# Patient Record
Sex: Female | Born: 1960 | Race: White | Hispanic: No | Marital: Married | Smoking: Never smoker
Health system: Southern US, Community
[De-identification: ages and names within clinical notes are randomized; demographics above are authoritative.]

## PROBLEM LIST (undated history)

## (undated) DIAGNOSIS — R112 Nausea with vomiting, unspecified: Secondary | ICD-10-CM

## (undated) DIAGNOSIS — L304 Erythema intertrigo: Secondary | ICD-10-CM

## (undated) DIAGNOSIS — R9431 Abnormal electrocardiogram [ECG] [EKG]: Secondary | ICD-10-CM

## (undated) DIAGNOSIS — R7989 Other specified abnormal findings of blood chemistry: Secondary | ICD-10-CM

## (undated) DIAGNOSIS — I10 Essential (primary) hypertension: Secondary | ICD-10-CM

## (undated) DIAGNOSIS — Z9889 Other specified postprocedural states: Secondary | ICD-10-CM

## (undated) HISTORY — DX: Erythema intertrigo: L30.4

## (undated) HISTORY — DX: Abnormal electrocardiogram (ECG) (EKG): R94.31

## (undated) HISTORY — DX: Other specified abnormal findings of blood chemistry: R79.89

## (undated) HISTORY — DX: Nausea with vomiting, unspecified: R11.2

## (undated) HISTORY — PX: KNEE SURGERY: SHX244

## (undated) HISTORY — PX: DILATION AND CURETTAGE OF UTERUS: SHX78

## (undated) HISTORY — DX: Other specified postprocedural states: Z98.890

## (undated) HISTORY — DX: Essential (primary) hypertension: I10

---

## 1998-03-02 ENCOUNTER — Other Ambulatory Visit: Admission: RE | Admit: 1998-03-02 | Discharge: 1998-03-02 | Payer: Self-pay | Admitting: Otolaryngology

## 1999-05-11 ENCOUNTER — Emergency Department (HOSPITAL_COMMUNITY): Admission: EM | Admit: 1999-05-11 | Discharge: 1999-05-11 | Payer: Self-pay | Admitting: Emergency Medicine

## 1999-05-11 ENCOUNTER — Encounter: Payer: Self-pay | Admitting: Emergency Medicine

## 2001-06-29 ENCOUNTER — Other Ambulatory Visit: Admission: RE | Admit: 2001-06-29 | Discharge: 2001-06-29 | Payer: Self-pay | Admitting: Gynecology

## 2002-07-29 ENCOUNTER — Other Ambulatory Visit: Admission: RE | Admit: 2002-07-29 | Discharge: 2002-07-29 | Payer: Self-pay | Admitting: Gynecology

## 2002-08-09 ENCOUNTER — Encounter: Payer: Self-pay | Admitting: Gynecology

## 2002-08-09 ENCOUNTER — Encounter: Admission: RE | Admit: 2002-08-09 | Discharge: 2002-08-09 | Payer: Self-pay | Admitting: Gynecology

## 2002-08-13 ENCOUNTER — Encounter: Payer: Self-pay | Admitting: Gynecology

## 2002-08-13 ENCOUNTER — Encounter: Admission: RE | Admit: 2002-08-13 | Discharge: 2002-08-13 | Payer: Self-pay | Admitting: Gynecology

## 2003-11-14 ENCOUNTER — Other Ambulatory Visit: Admission: RE | Admit: 2003-11-14 | Discharge: 2003-11-14 | Payer: Self-pay | Admitting: Gynecology

## 2005-02-16 ENCOUNTER — Other Ambulatory Visit: Admission: RE | Admit: 2005-02-16 | Discharge: 2005-02-16 | Payer: Self-pay | Admitting: Gynecology

## 2006-04-28 ENCOUNTER — Other Ambulatory Visit: Admission: RE | Admit: 2006-04-28 | Discharge: 2006-04-28 | Payer: Self-pay | Admitting: Gynecology

## 2006-06-09 ENCOUNTER — Encounter: Admission: RE | Admit: 2006-06-09 | Discharge: 2006-06-09 | Payer: Self-pay | Admitting: Gynecology

## 2007-06-01 ENCOUNTER — Other Ambulatory Visit: Admission: RE | Admit: 2007-06-01 | Discharge: 2007-06-01 | Payer: Self-pay | Admitting: Gynecology

## 2008-08-08 ENCOUNTER — Encounter: Admission: RE | Admit: 2008-08-08 | Discharge: 2008-08-08 | Payer: Self-pay | Admitting: Gynecology

## 2008-08-15 ENCOUNTER — Ambulatory Visit: Payer: Self-pay | Admitting: Gynecology

## 2008-08-15 ENCOUNTER — Encounter: Payer: Self-pay | Admitting: Gynecology

## 2008-08-15 ENCOUNTER — Other Ambulatory Visit: Admission: RE | Admit: 2008-08-15 | Discharge: 2008-08-15 | Payer: Self-pay | Admitting: Gynecology

## 2008-08-26 ENCOUNTER — Ambulatory Visit: Payer: Self-pay | Admitting: Gynecology

## 2008-09-10 ENCOUNTER — Ambulatory Visit: Payer: Self-pay | Admitting: Internal Medicine

## 2008-09-10 LAB — COMPREHENSIVE METABOLIC PANEL
Albumin: 4 g/dL (ref 3.5–5.2)
BUN: 10 mg/dL (ref 6–23)
CO2: 24 mEq/L (ref 19–32)
Calcium: 9.7 mg/dL (ref 8.4–10.5)
Chloride: 104 mEq/L (ref 96–112)
Glucose, Bld: 82 mg/dL (ref 70–99)
Potassium: 4.1 mEq/L (ref 3.5–5.3)
Sodium: 139 mEq/L (ref 135–145)
Total Protein: 7.8 g/dL (ref 6.0–8.3)

## 2008-09-10 LAB — CBC WITH DIFFERENTIAL/PLATELET
Basophils Absolute: 0.1 10*3/uL (ref 0.0–0.1)
Eosinophils Absolute: 0 10*3/uL (ref 0.0–0.5)
HCT: 41.5 % (ref 34.8–46.6)
HGB: 13.9 g/dL (ref 11.6–15.9)
NEUT#: 5.5 10*3/uL (ref 1.5–6.5)
NEUT%: 56.4 % (ref 39.6–76.8)
RDW: 13.5 % (ref 11.3–14.5)
lymph#: 3.3 10*3/uL (ref 0.9–3.3)

## 2008-09-10 LAB — IRON AND TIBC
%SAT: 24 % (ref 20–55)
TIBC: 289 ug/dL (ref 250–470)

## 2008-09-18 ENCOUNTER — Ambulatory Visit (HOSPITAL_COMMUNITY): Admission: RE | Admit: 2008-09-18 | Discharge: 2008-09-18 | Payer: Self-pay | Admitting: Internal Medicine

## 2008-09-29 LAB — CBC WITH DIFFERENTIAL/PLATELET
BASO%: 0.6 % (ref 0.0–2.0)
EOS%: 0.6 % (ref 0.0–7.0)
HGB: 13.6 g/dL (ref 11.6–15.9)
LYMPH%: 36.5 % (ref 14.0–48.0)
MONO%: 9.1 % (ref 0.0–13.0)
NEUT#: 4.8 10*3/uL (ref 1.5–6.5)
NEUT%: 53.2 % (ref 39.6–76.8)
WBC: 9 10*3/uL (ref 3.9–10.0)
lymph#: 3.3 10*3/uL (ref 0.9–3.3)

## 2008-12-16 ENCOUNTER — Ambulatory Visit: Payer: Self-pay | Admitting: Internal Medicine

## 2008-12-22 LAB — CBC WITH DIFFERENTIAL/PLATELET
BASO%: 0.7 % (ref 0.0–2.0)
EOS%: 0.9 % (ref 0.0–7.0)
HCT: 41 % (ref 34.8–46.6)
HGB: 13.8 g/dL (ref 11.6–15.9)
MCH: 30.5 pg (ref 26.0–34.0)
MCHC: 33.6 g/dL (ref 32.0–36.0)
MONO#: 0.8 10*3/uL (ref 0.1–0.9)
NEUT%: 53.1 % (ref 39.6–76.8)
RDW: 13.8 % (ref 11.3–14.5)
WBC: 9.6 10*3/uL (ref 3.9–10.0)
lymph#: 3.6 10*3/uL — ABNORMAL HIGH (ref 0.9–3.3)

## 2008-12-25 LAB — SEDIMENTATION RATE: Sed Rate: 15 mm/hr (ref 0–22)

## 2008-12-25 LAB — JAK2 EXONS 12 & 13 MUTATION, REFLEX: JAK2 Exons 12 & 13: 13

## 2009-02-06 IMAGING — US US ABDOMEN COMPLETE
1 series · 14 of 25 positions shown · non-contrast
Comparison: None

CLINICAL DATA: Thrombocytopenia possible splenomegaly

ABDOMEN ULTRASOUND
TECHNIQUE: Complete abdominal ultrasound examination was performed
including evaluation of the liver, gallbladder, bile ducts,
pancreas, kidneys, spleen, IVC, and abdominal aorta.

[Series 1: unknown · 0.32mm/px · 14 of 108 slices shown]
[im 1/108]
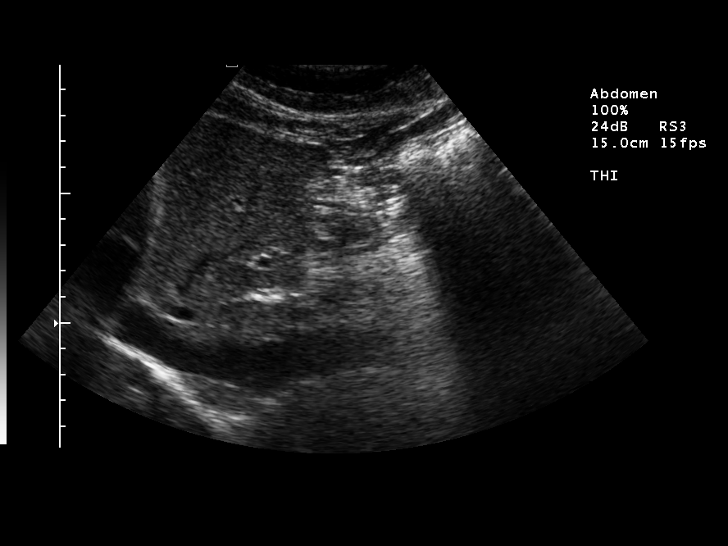
[im 9/108]
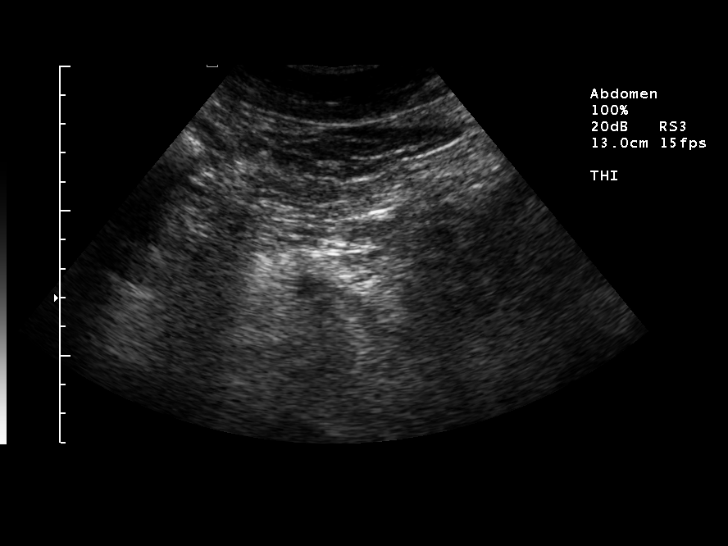
[im 18/108]
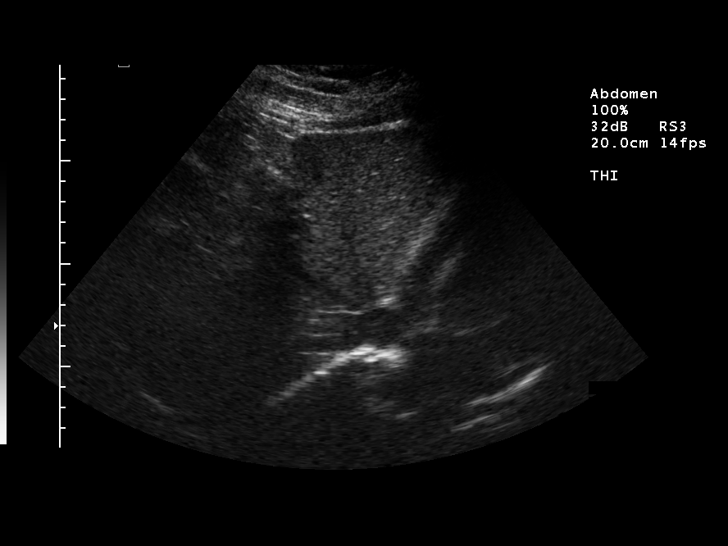
[im 27/108]
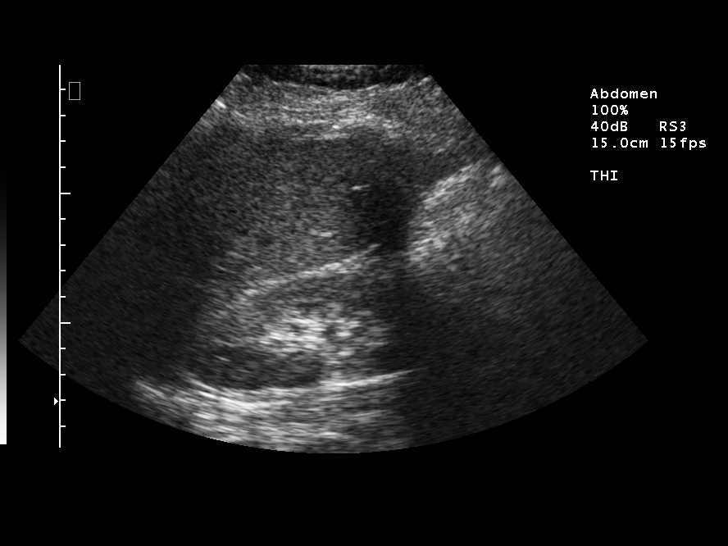
[im 36/108]
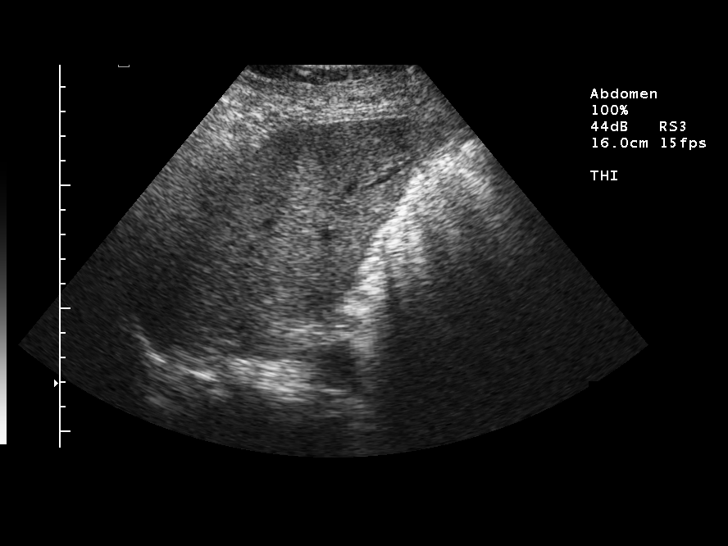
[im 41/108]
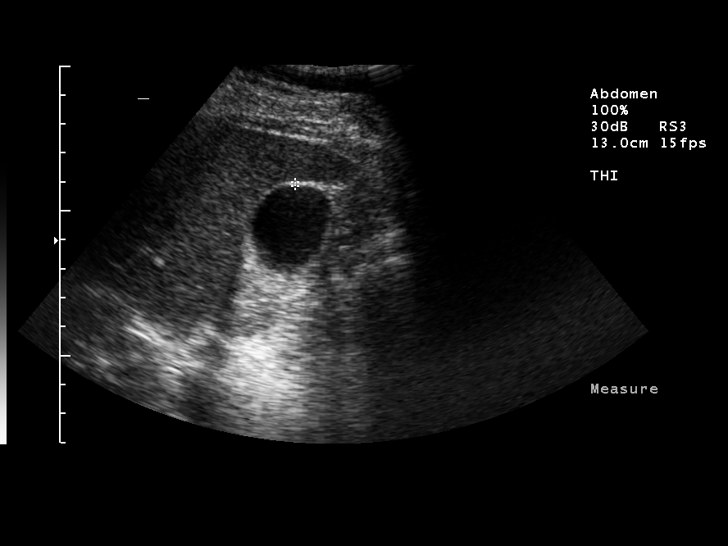
[im 50/108]
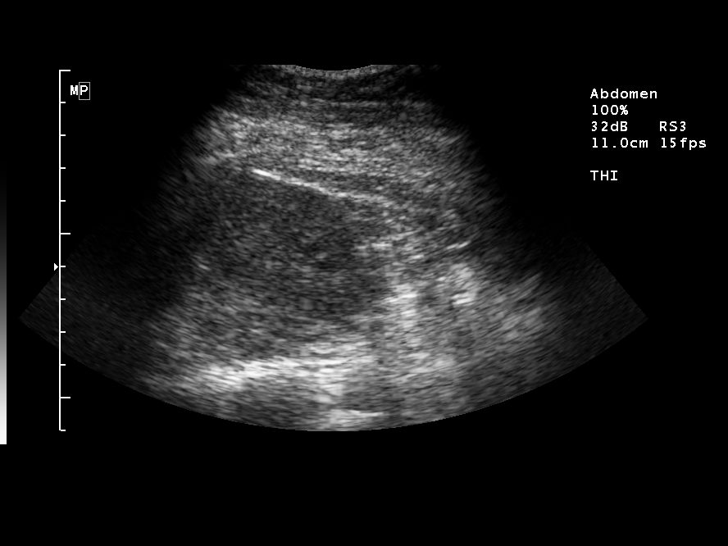
[im 58/108]
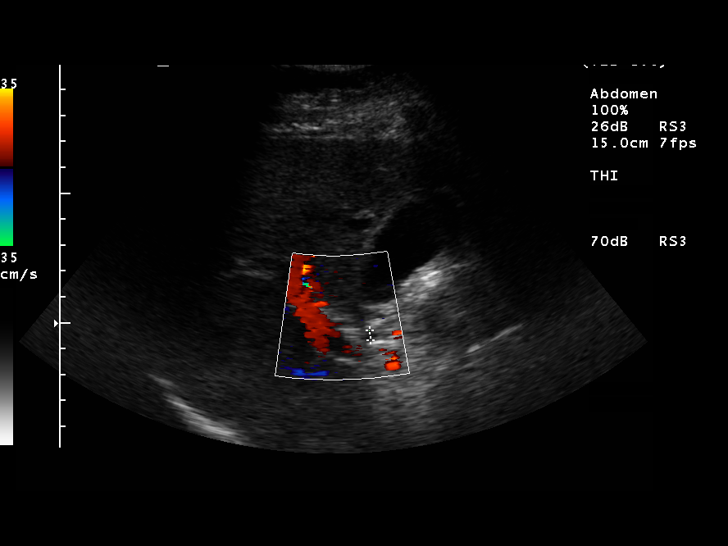
[im 67/108]
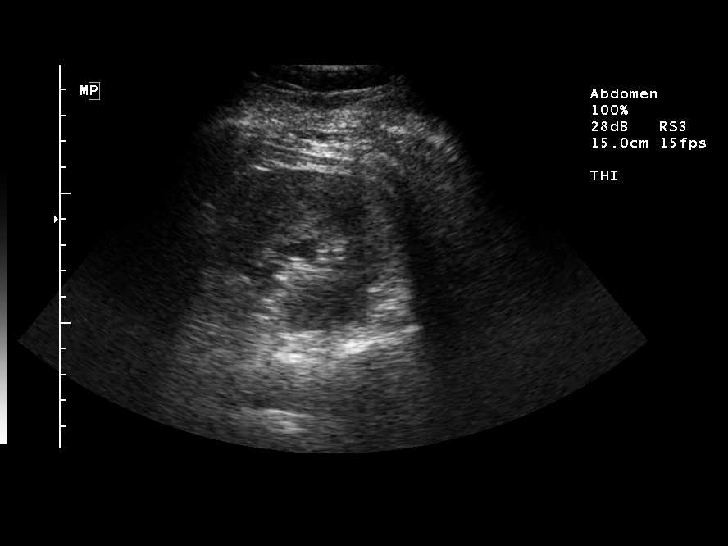
[im 72/108]
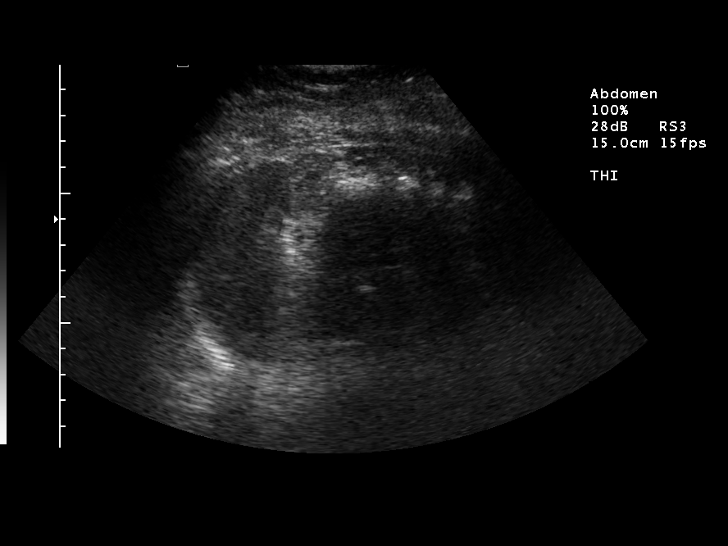
[im 81/108]
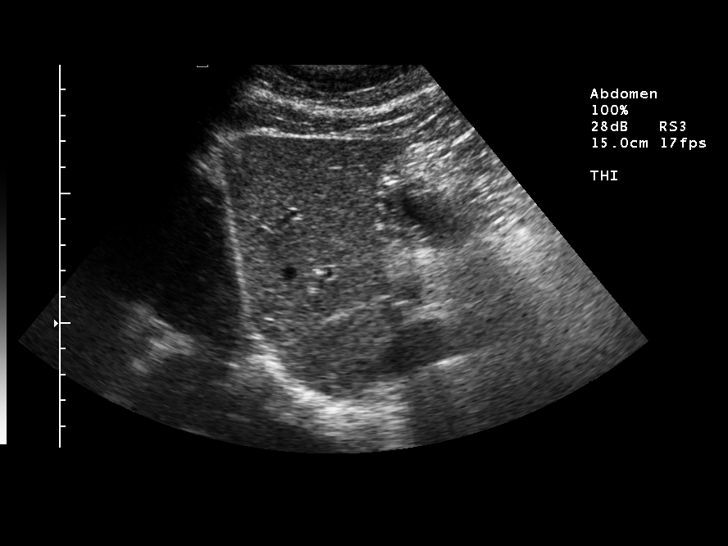
[im 90/108]
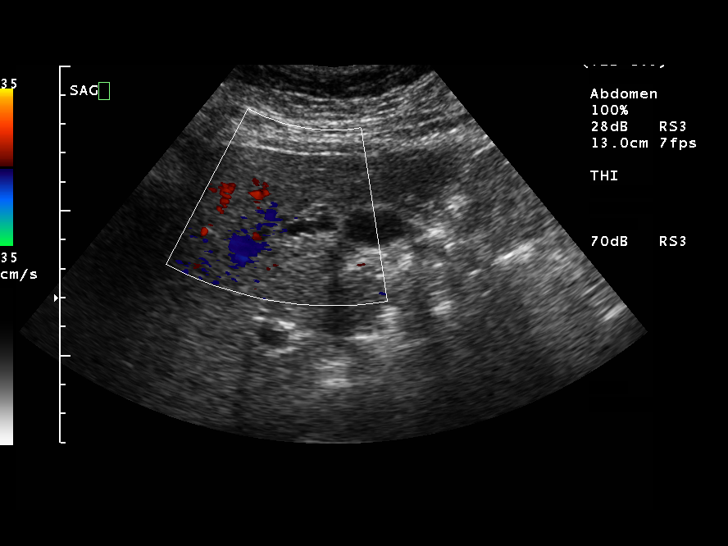
[im 99/108]
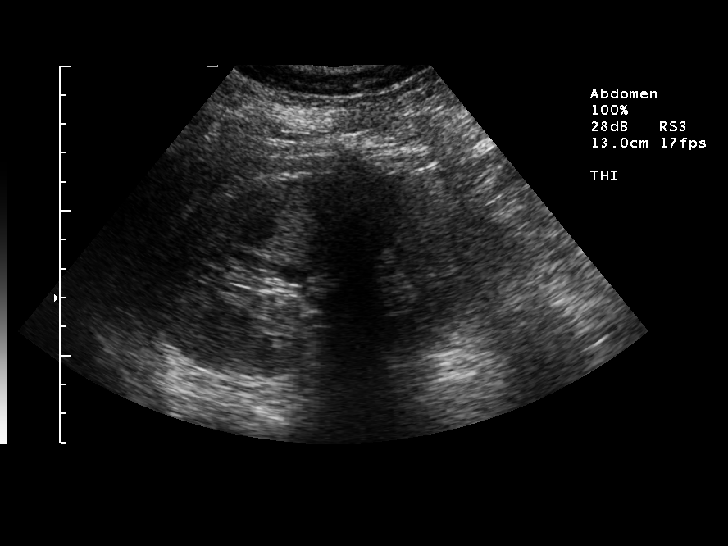
[im 108/108]
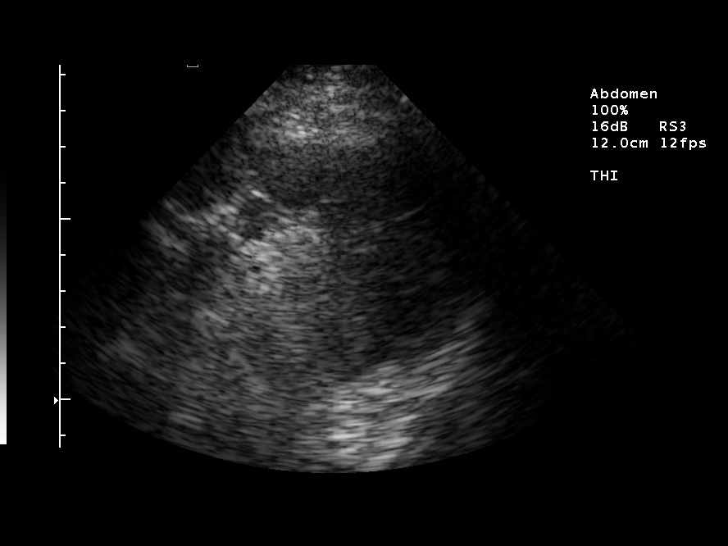

[14 of 25 positions shown; findings below may reference images not displayed]

FINDINGS: The visualized liver shows normal echogenicity.  No
gallstones are noted within gallbladder.  There is a tubular
structure adjacent to the gallbladder most likely represents a
biliary duct rather than a biliary dilated radicle.  No flow is
demonstrated.  No pericholecystic fluid.  CBD measures 3.8 mm in
diameter.  The visualized IVC and pancreas are unremarkable.

 The spleen measures 5.1 cm transversely kidney measures 11.8 cm in
length.  Left kidney measures 12.2 cm in length.  No hydronephrosis
or diagnostic renal calculus.

Abdominal aorta measures up to 2.1 cm in diameter.
IMPRESSION: 1.  No gallstones.  No splenomegaly.
2.  Normal CBD.
3.  No hydronephrosis or diagnostic renal calculus.

## 2009-03-26 ENCOUNTER — Ambulatory Visit: Payer: Self-pay | Admitting: Internal Medicine

## 2009-10-28 ENCOUNTER — Ambulatory Visit: Payer: Self-pay | Admitting: Gynecology

## 2009-10-29 ENCOUNTER — Other Ambulatory Visit: Admission: RE | Admit: 2009-10-29 | Discharge: 2009-10-29 | Payer: Self-pay | Admitting: Gynecology

## 2009-11-07 DIAGNOSIS — D75838 Other thrombocytosis: Secondary | ICD-10-CM

## 2009-11-07 HISTORY — DX: Other thrombocytosis: D75.838

## 2009-12-01 ENCOUNTER — Ambulatory Visit: Payer: Self-pay | Admitting: Internal Medicine

## 2009-12-01 LAB — CBC WITH DIFFERENTIAL/PLATELET
BASO%: 0.3 % (ref 0.0–2.0)
Eosinophils Absolute: 0.2 10*3/uL (ref 0.0–0.5)
LYMPH%: 34 % (ref 14.0–49.7)
MCHC: 33.6 g/dL (ref 31.5–36.0)
MONO#: 0.6 10*3/uL (ref 0.1–0.9)
MONO%: 7 % (ref 0.0–14.0)
NEUT#: 4.7 10*3/uL (ref 1.5–6.5)
RBC: 4.47 10*6/uL (ref 3.70–5.45)
RDW: 13.3 % (ref 11.2–14.5)
WBC: 8.4 10*3/uL (ref 3.9–10.3)

## 2009-12-01 LAB — LACTATE DEHYDROGENASE: LDH: 130 U/L (ref 94–250)

## 2010-11-10 ENCOUNTER — Encounter
Admission: RE | Admit: 2010-11-10 | Discharge: 2010-11-10 | Payer: Self-pay | Source: Home / Self Care | Attending: Gynecology | Admitting: Gynecology

## 2010-11-28 ENCOUNTER — Encounter: Payer: Self-pay | Admitting: Gynecology

## 2011-01-28 ENCOUNTER — Encounter: Payer: Self-pay | Admitting: Gynecology

## 2011-12-09 ENCOUNTER — Other Ambulatory Visit: Payer: Self-pay | Admitting: Gynecology

## 2011-12-09 DIAGNOSIS — Z1231 Encounter for screening mammogram for malignant neoplasm of breast: Secondary | ICD-10-CM

## 2011-12-26 ENCOUNTER — Ambulatory Visit
Admission: RE | Admit: 2011-12-26 | Discharge: 2011-12-26 | Disposition: A | Discharge status: Home / Self Care | Payer: Federal, State, Local not specified - PPO | Source: Ambulatory Visit | Attending: Gynecology | Admitting: Gynecology

## 2011-12-26 DIAGNOSIS — Z1231 Encounter for screening mammogram for malignant neoplasm of breast: Secondary | ICD-10-CM

## 2011-12-28 ENCOUNTER — Encounter: Payer: Self-pay | Admitting: Gynecology

## 2012-01-06 ENCOUNTER — Ambulatory Visit (INDEPENDENT_AMBULATORY_CARE_PROVIDER_SITE_OTHER): Payer: Federal, State, Local not specified - PPO | Admitting: Gynecology

## 2012-01-06 ENCOUNTER — Encounter: Payer: Self-pay | Admitting: Gynecology

## 2012-01-06 VITALS — BP 142/92 | Ht 60.75 in | Wt 175.0 lb

## 2012-01-06 DIAGNOSIS — N912 Amenorrhea, unspecified: Secondary | ICD-10-CM

## 2012-01-06 DIAGNOSIS — Z01419 Encounter for gynecological examination (general) (routine) without abnormal findings: Secondary | ICD-10-CM

## 2012-01-06 DIAGNOSIS — Z0000 Encounter for general adult medical examination without abnormal findings: Secondary | ICD-10-CM

## 2012-01-06 DIAGNOSIS — N951 Menopausal and female climacteric states: Secondary | ICD-10-CM

## 2012-01-06 DIAGNOSIS — Z6835 Body mass index (BMI) 35.0-35.9, adult: Secondary | ICD-10-CM | POA: Insufficient documentation

## 2012-01-06 DIAGNOSIS — E663 Overweight: Secondary | ICD-10-CM

## 2012-01-06 DIAGNOSIS — I10 Essential (primary) hypertension: Secondary | ICD-10-CM | POA: Insufficient documentation

## 2012-01-06 HISTORY — DX: Body mass index (BMI) 35.0-35.9, adult: Z68.35

## 2012-01-06 LAB — CBC WITH DIFFERENTIAL/PLATELET
Basophils Absolute: 0 10*3/uL (ref 0.0–0.1)
Basophils Relative: 0 % (ref 0–1)
Eosinophils Relative: 2 % (ref 0–5)
Lymphocytes Relative: 42 % (ref 12–46)
MCHC: 31.6 g/dL (ref 30.0–36.0)
Neutro Abs: 4.3 10*3/uL (ref 1.7–7.7)
Platelets: 532 10*3/uL — ABNORMAL HIGH (ref 150–400)
RDW: 14.2 % (ref 11.5–15.5)
WBC: 9 10*3/uL (ref 4.0–10.5)

## 2012-01-06 LAB — TSH: TSH: 1.919 u[IU]/mL (ref 0.350–4.500)

## 2012-01-06 MED ORDER — HYDROCHLOROTHIAZIDE 12.5 MG PO CAPS: 12.5000 mg | ORAL_CAPSULE | Freq: Every day | ORAL | Status: DC

## 2012-01-06 NOTE — Progress Notes (Signed)
Carol Franklin 1961/03/06 [id number]   History:    51 y.o.  for annual exam who has not been seen the office is 2011. Patient previously had been diagnosed with hypertension and had been placed on hydrochlorothiazide and had stopped taking medication over year now. Blood pressure today was 142/92. Her husband had a vasectomy. Her other major complaint today has been hot flashes, irritability, mood swing, decreased libido, and vaginal dryness, and insomnia. Her last menstrual period reported to be made 2011. Mammogram done February this year normal. Patient does her monthly self breast examination. Patient with prior history of mild reactive thrombocytosis had been followed by Dr. Arlis Porta hematologist oncologist and had evaluated her and she was negative for the JAK2 mutation. She has not seen him in over year.  Past medical history,surgical history, family history and social history were all reviewed and documented in the EPIC chart.  Gynecologic History Patient's last menstrual period was 03/08/2011. Contraception: vasectomy Last Pap: 2011. Results were: normal Last mammogram: 2013. Results were: normal  Obstetric History OB History    Grav Para Term Preterm Abortions TAB SAB Ect Mult Living   4 2 2  2  2   2      # Outc Date GA Lbr Len/2nd Wgt Sex Del Anes PTL Lv   1 TRM     M CS  No Yes   2 TRM     M CS  No Yes   3 SAB            4 SAB                ROS:  Was performed and pertinent positives and negatives are included in the history.  Exam: chaperone present  BP 142/92  Ht 5' 0.75" (1.543 m)  Wt 175 lb (79.379 kg)  BMI 33.34 kg/m2  LMP 03/08/2011  Body mass index is 33.34 kg/(m^2).  General appearance : Well developed well nourished female. No acute distress HEENT: Neck supple, trachea midline, no carotid bruits, no thyroidmegaly Lungs: Clear to auscultation, no rhonchi or wheezes, or rib retractions  Heart: Regular rate and rhythm, no murmurs or  gallops Breast:Examined in sitting and supine position were symmetrical in appearance, no palpable masses or tenderness,  no skin retraction, no nipple inversion, no nipple discharge, no skin discoloration, no axillary or supraclavicular lymphadenopathy Abdomen: no palpable masses or tenderness, no rebound or guarding Extremities: no edema or skin discoloration or tenderness  Pelvic:  Bartholin, Urethra, Skene Glands: Within normal limits             Vagina: No gross lesions or discharge  Cervix: No gross lesions or discharge  Uterus  anteverted, normal size, shape and consistency, non-tender and mobile  Adnexa  Without masses or tenderness  Anus and perineum  normal   Rectovaginal  normal sphincter tone without palpated masses or tenderness             Hemoccult cards provided for patient to submit to the office for testing     Assessment/Plan:  51 y.o. female for annual exam with apparent menopausal symptoms. We will check her [facility] today along with a TSH and prolactin. Because of her weight will be checking a hemoglobin A1c since she's not fasting we'll also check her TSH screening cholesterol along with CBC urinalysis. No Pap smear done today. New screening guidelines discussed. Next Pap smear next year. Patient will be started back on her HCTZ 12.5 mg daily. She is to  maintain a log of her blood pressures for the next week before she come back to see me to discuss the above labs. Literature information on the menopause and hormone replacement therapy was provided as well.    Ok Edwards MD, 10:19 AM 01/06/2012

## 2012-01-06 NOTE — Progress Notes (Signed)
Addended by: Ok Edwards on: 01/06/2012 11:58 AM   Modules accepted: Orders

## 2012-01-07 LAB — URINALYSIS W MICROSCOPIC + REFLEX CULTURE
Casts: NONE SEEN
Crystals: NONE SEEN
Glucose, UA: NEGATIVE mg/dL
Hgb urine dipstick: NEGATIVE
Leukocytes, UA: NEGATIVE
Nitrite: NEGATIVE
Specific Gravity, Urine: 1.019 (ref 1.005–1.030)
Squamous Epithelial / LPF: NONE SEEN
pH: 6.5 (ref 5.0–8.0)

## 2012-01-09 ENCOUNTER — Other Ambulatory Visit: Payer: Self-pay | Admitting: *Deleted

## 2012-01-09 DIAGNOSIS — E7800 Pure hypercholesterolemia, unspecified: Secondary | ICD-10-CM

## 2012-01-10 ENCOUNTER — Other Ambulatory Visit: Payer: Federal, State, Local not specified - PPO

## 2012-01-10 DIAGNOSIS — E7800 Pure hypercholesterolemia, unspecified: Secondary | ICD-10-CM

## 2012-01-10 LAB — LIPID PANEL
LDL Cholesterol: 118 mg/dL — ABNORMAL HIGH (ref 0–99)
Total CHOL/HDL Ratio: 2.8 Ratio
Triglycerides: 78 mg/dL (ref <150)
VLDL: 16 mg/dL (ref 0–40)

## 2012-01-11 ENCOUNTER — Other Ambulatory Visit: Payer: Self-pay | Admitting: *Deleted

## 2012-01-11 DIAGNOSIS — E7800 Pure hypercholesterolemia, unspecified: Secondary | ICD-10-CM

## 2012-01-13 ENCOUNTER — Ambulatory Visit (INDEPENDENT_AMBULATORY_CARE_PROVIDER_SITE_OTHER): Payer: Federal, State, Local not specified - PPO | Admitting: Gynecology

## 2012-01-13 ENCOUNTER — Encounter: Payer: Self-pay | Admitting: Gynecology

## 2012-01-13 VITALS — BP 130/90

## 2012-01-13 DIAGNOSIS — E785 Hyperlipidemia, unspecified: Secondary | ICD-10-CM

## 2012-01-13 DIAGNOSIS — N951 Menopausal and female climacteric states: Secondary | ICD-10-CM

## 2012-01-13 DIAGNOSIS — D473 Essential (hemorrhagic) thrombocythemia: Secondary | ICD-10-CM

## 2012-01-13 DIAGNOSIS — G4700 Insomnia, unspecified: Secondary | ICD-10-CM

## 2012-01-13 DIAGNOSIS — I10 Essential (primary) hypertension: Secondary | ICD-10-CM

## 2012-01-13 DIAGNOSIS — R635 Abnormal weight gain: Secondary | ICD-10-CM

## 2012-01-13 HISTORY — DX: Menopausal and female climacteric states: N95.1

## 2012-01-13 HISTORY — DX: Hyperlipidemia, unspecified: E78.5

## 2012-01-13 HISTORY — DX: Essential (hemorrhagic) thrombocythemia: D47.3

## 2012-01-13 MED ORDER — ZOLPIDEM TARTRATE ER 12.5 MG PO TBCR: 12.5000 mg | EXTENDED_RELEASE_TABLET | Freq: Every evening | ORAL | Status: AC | PRN

## 2012-01-13 NOTE — Progress Notes (Signed)
Patient 51 year old who presented to the office for consultation after lab work that was done in the office visit on March 1. She had been complaining of hot flashes irritability mood swing and decreased libido and vaginal dryness and insomnia. Her FSH was found to be elevated at 87. We had a lengthy discussion today on hormone replacement therapy as well as the risks benefits and pros and cons and the women's health initiative study. We discussed different ways to administer the hormone replacement therapy from the oral to transdermal. Patient previously had been provided with literature formation on the above subject. She has decided she would like to wait for 6 more months and see if her symptoms worsen and try over-the-counter soy products. We did discuss the utilization of either one of the following SSRIs: Pritiq or Effexor which will help with her vasomotor symptoms but she would like to hold for now. For her insomnia she'll be given a prescription of Ambien to take 1 by mouth at bedtime when necessary.  Patient has not seen her internist in over year. We had placed her on HCTZ 12.5 mg daily for hypertension. Her blood pressure today was 130/90. We had discussed importance of low salt diet as well as regular exercise and low-fat diet as well for which literature information been provided. Her fasting lipid profile had demonstrated a total cholesterol 207 triglycerides 78, HDL 73, VLDL 16 cholesterol/HDL ratio 2.8 and her LDL was elevated at 118. She will return back in 6 months for a comprehensive metabolic panel and fasting lipid profile. She is going to send me her blood pressure readings that she's going to take at home twice a day for the next week. If her blood pressure continues to be elevated despite her being on HCTZ she will need to see her primary physician for combination therapy for her hypertension. The same goes  her for her hyper lipidemia if still elevated in 6 months she will need treatment  as well. If her vasomotor symptoms worsen she will contact us and we'll prescribe her the hormone replacement therapy elestrin 0.6% that she would apply transdermally on one arm daily with the addition of Prometrium 200 mg for 10-12 days of the month. Her recent labs also indicated that she had thrombocytosis with a platelet of 532,000. Patient with known history of mild reactive thrombocytosis had been followed by oncologist Dr. Woodroe Mode. Patient was informed that she needs to followup with him since his been several years. I recommend she start taking a baby aspirin 1 tablet daily.

## 2012-01-13 NOTE — Patient Instructions (Signed)
Cholesterol Control Diet  Cholesterol levels in your body are determined significantly by your diet. Cholesterol levels may also be related to heart disease. The following material helps to explain this relationship and discusses what you can do to help keep your heart healthy. Not all cholesterol is bad. Low-density lipoprotein (LDL) cholesterol is the "bad" cholesterol. It may cause fatty deposits to build up inside your arteries. High-density lipoprotein (HDL) cholesterol is "good." It helps to remove the "bad" LDL cholesterol from your blood. Cholesterol is a very important risk factor for heart disease. Other risk factors are high blood pressure, smoking, stress, heredity, and weight. The heart muscle gets its supply of blood through the coronary arteries. If your LDL cholesterol is high and your HDL cholesterol is low, you are at risk for having fatty deposits build up in your coronary arteries. This leaves less room through which blood can flow. Without sufficient blood and oxygen, the heart muscle cannot function properly and you may feel chest pains (angina pectoris). When a coronary artery closes up entirely, a part of the heart muscle may die, causing a heart attack (myocardial infarction). CHECKING CHOLESTEROL When your caregiver sends your blood to a lab to be analyzed for cholesterol, a complete lipid (fat) profile may be done. With this test, the total amount of cholesterol and levels of LDL and HDL are determined. Triglycerides are a type of fat that circulates in the blood and can also be used to determine heart disease risk. The list below describes what the numbers should be: Test: Total Cholesterol.  Less than 200 mg/dl.  Test: LDL "bad cholesterol."  Less than 100 mg/dl.   Less than 70 mg/dl if you are at very high risk of a heart attack or sudden cardiac death.  Test: HDL "good cholesterol."  Greater than 50 mg/dl for women.    Greater than 40 mg/dl for men.  Test: Triglycerides.  Less than 150 mg/dl.  CONTROLLING CHOLESTEROL WITH DIET Although exercise and lifestyle factors are important, your diet is key. That is because certain foods are known to raise cholesterol and others to lower it. The goal is to balance foods for their effect on cholesterol and more importantly, to replace saturated and trans fat with other types of fat, such as monounsaturated fat, polyunsaturated fat, and omega-3 fatty acids. On average, a person should consume no more than 15 to 17 g of saturated fat daily. Saturated and trans fats are considered "bad" fats, and they will raise LDL cholesterol. Saturated fats are primarily found in animal products such as meats, butter, and cream. However, that does not mean you need to sacrifice all your favorite foods. Today, there are good tasting, low-fat, low-cholesterol substitutes for most of the things you like to eat. Choose low-fat or nonfat alternatives. Choose round or loin cuts of red meat, since these types of cuts are lowest in fat and cholesterol. Chicken (without the skin), fish, veal, and ground turkey breast are excellent choices. Eliminate fatty meats, such as hot dogs and salami. Even shellfish have little or no saturated fat. Have a 3 oz (85 g) portion when you eat lean meat, poultry, or fish. Trans fats are also called "partially hydrogenated oils." They are oils that have been scientifically manipulated so that they are solid at room temperature resulting in a longer shelf life and improved taste and texture of foods in which they are added. Trans fats are found in stick margarine, some tub margarines, cookies, crackers, and baked goods.    When baking and cooking, oils are an excellent substitute for butter. The monounsaturated oils are especially beneficial since it is believed they lower LDL and raise HDL. The oils you should avoid entirely are saturated tropical oils, such as coconut and  palm.  Remember to eat liberally from food groups that are naturally free of saturated and trans fat, including fish, fruit, vegetables, beans, grains (barley, rice, couscous, bulgur wheat), and pasta (without cream sauces).  IDENTIFYING FOODS THAT LOWER CHOLESTEROL  Soluble fiber may lower your cholesterol. This type of fiber is found in fruits such as apples, vegetables such as broccoli, potatoes, and carrots, legumes such as beans, peas, and lentils, and grains such as barley. Foods fortified with plant sterols (phytosterol) may also lower cholesterol. You should eat at least 2 g per day of these foods for a cholesterol lowering effect.  Read package labels to identify low-saturated fats, trans fats free, and low-fat foods at the supermarket. Select cheeses that have only 2 to 3 g saturated fat per ounce. Use a heart-healthy tub margarine that is free of trans fats or partially hydrogenated oil. When buying baked goods (cookies, crackers), avoid partially hydrogenated oils. Breads and muffins should be made from whole grains (whole-wheat or whole oat flour, instead of "flour" or "enriched flour"). Buy non-creamy canned soups with reduced salt and no added fats.  FOOD PREPARATION TECHNIQUES  Never deep-fry. If you must fry, either stir-fry, which uses very little fat, or use non-stick cooking sprays. When possible, broil, bake, or roast meats, and steam vegetables. Instead of dressing vegetables with butter or margarine, use lemon and herbs, applesauce and cinnamon (for squash and sweet potatoes), nonfat yogurt, salsa, and low-fat dressings for salads.  LOW-SATURATED FAT / LOW-FAT FOOD SUBSTITUTES Meats / Saturated Fat (g)  Avoid: Steak, marbled (3 oz/85 g) / 11 g   Choose: Steak, lean (3 oz/85 g) / 4 g   Avoid: Hamburger (3 oz/85 g) / 7 g   Choose: Hamburger, lean (3 oz/85 g) / 5 g   Avoid: Ham (3 oz/85 g) / 6 g   Choose: Ham, lean cut (3 oz/85 g) / 2.4 g   Avoid: Chicken, with skin, dark  meat (3 oz/85 g) / 4 g   Choose: Chicken, skin removed, dark meat (3 oz/85 g) / 2 g   Avoid: Chicken, with skin, light meat (3 oz/85 g) / 2.5 g   Choose: Chicken, skin removed, light meat (3 oz/85 g) / 1 g  Dairy / Saturated Fat (g)  Avoid: Whole milk (1 cup) / 5 g   Choose: Low-fat milk, 2% (1 cup) / 3 g   Choose: Low-fat milk, 1% (1 cup) / 1.5 g   Choose: Skim milk (1 cup) / 0.3 g   Avoid: Hard cheese (1 oz/28 g) / 6 g   Choose: Skim milk cheese (1 oz/28 g) / 2 to 3 g   Avoid: Cottage cheese, 4% fat (1 cup) / 6.5 g   Choose: Low-fat cottage cheese, 1% fat (1 cup) / 1.5 g   Avoid: Ice cream (1 cup) / 9 g   Choose: Sherbet (1 cup) / 2.5 g   Choose: Nonfat frozen yogurt (1 cup) / 0.3 g   Choose: Frozen fruit bar / trace   Avoid: Whipped cream (1 tbs) / 3.5 g   Choose: Nondairy whipped topping (1 tbs) / 1 g  Condiments / Saturated Fat (g)  Avoid: Mayonnaise (1 tbs) / 2 g   Choose: Low-fat   mayonnaise (1 tbs) / 1 g   Avoid: Butter (1 tbs) / 7 g   Choose: Extra light margarine (1 tbs) / 1 g   Avoid: Coconut oil (1 tbs) / 11.8 g   Choose: Olive oil (1 tbs) / 1.8 g   Choose: Corn oil (1 tbs) / 1.7 g   Choose: Safflower oil (1 tbs) / 1.2 g   Choose: Sunflower oil (1 tbs) / 1.4 g   Choose: Soybean oil (1 tbs) / 2.4 g   Choose: Canola oil (1 tbs) / 1 g  Document Released: 10/24/2005 Document Revised: 07/06/2011 Document Reviewed: 04/14/2011 [employer] Patient Information 2012 [employer].  Exercise to Lose Weight Exercise and a healthy diet may help you lose weight. Your doctor may suggest specific exercises. EXERCISE IDEAS AND TIPS  Choose low-cost things you enjoy doing, such as walking, bicycling, or exercising to workout videos.   Take stairs instead of the elevator.   Walk during your lunch break.   Park your car further away from work or school.   Go to a gym or an exercise class.   Start with 5 to 10 minutes of exercise each day. Build up to  30 minutes of exercise 4 to 6 days a week.   Wear shoes with good support and comfortable clothes.   Stretch before and after working out.   Work out until you breathe harder and your heart beats faster.   Drink extra water when you exercise.   Do not do so much that you hurt yourself, feel dizzy, or get very short of breath.  Exercises that burn about 150 calories:  Running 1  miles in 15 minutes.   Playing volleyball for 45 to 60 minutes.   Washing and waxing a car for 45 to 60 minutes.   Playing touch football for 45 minutes.   Walking 1  miles in 35 minutes.   Pushing a stroller 1  miles in 30 minutes.   Playing basketball for 30 minutes.   Raking leaves for 30 minutes.   Bicycling 5 miles in 30 minutes.   Walking 2 miles in 30 minutes.   Dancing for 30 minutes.   Shoveling snow for 15 minutes.   Swimming laps for 20 minutes.   Walking up stairs for 15 minutes.   Bicycling 4 miles in 15 minutes.   Gardening for 30 to 45 minutes.   Jumping rope for 15 minutes.   Washing windows or floors for 45 to 60 minutes.  Document Released: 11/26/2010 Document Revised: 07/06/2011 Document Reviewed: 11/26/2010 [employer] Patient Information 2012 [employer].  

## 2012-01-18 DIAGNOSIS — Z1211 Encounter for screening for malignant neoplasm of colon: Secondary | ICD-10-CM

## 2012-01-19 ENCOUNTER — Other Ambulatory Visit (INDEPENDENT_AMBULATORY_CARE_PROVIDER_SITE_OTHER): Payer: Federal, State, Local not specified - PPO | Admitting: Gynecology

## 2012-01-19 DIAGNOSIS — Z1211 Encounter for screening for malignant neoplasm of colon: Secondary | ICD-10-CM

## 2012-01-19 LAB — POC HEMOCCULT BLD/STL (OFFICE/1-CARD/DIAGNOSTIC): Fecal Occult Blood, POC: NEGATIVE

## 2012-04-10 ENCOUNTER — Telehealth: Payer: Self-pay | Admitting: *Deleted

## 2012-04-10 MED ORDER — HYDROCORTISONE VALERATE 0.2 % EX CREA: TOPICAL_CREAM | Freq: Two times a day (BID) | CUTANEOUS | Status: AC

## 2012-04-10 NOTE — Telephone Encounter (Signed)
Please call in refill for westcort 0.5 % apply B.I.D 5-7 days. (15 g tube) with one refill.

## 2012-04-10 NOTE — Telephone Encounter (Signed)
Pt calling requesting new rx for a westcort 0.5 % apply B.I.D 5-7 days, you give pt rx in 2010 for dermatitis. Okay to fill? Please advise

## 2012-04-10 NOTE — Telephone Encounter (Signed)
rx sent to pharmacy, only 0.2% cream in system.

## 2012-04-24 ENCOUNTER — Telehealth: Payer: Self-pay | Admitting: Internal Medicine

## 2012-04-24 NOTE — Telephone Encounter (Signed)
pt called and l.m for appt,called pt back and l/m for her to ret call

## 2013-01-30 ENCOUNTER — Other Ambulatory Visit: Payer: Self-pay

## 2013-01-30 DIAGNOSIS — Z1231 Encounter for screening mammogram for malignant neoplasm of breast: Secondary | ICD-10-CM

## 2013-01-31 ENCOUNTER — Ambulatory Visit
Admission: RE | Admit: 2013-01-31 | Discharge: 2013-01-31 | Disposition: A | Discharge status: Home / Self Care | Payer: Federal, State, Local not specified - PPO | Source: Ambulatory Visit

## 2013-01-31 DIAGNOSIS — Z1231 Encounter for screening mammogram for malignant neoplasm of breast: Secondary | ICD-10-CM

## 2013-07-22 DIAGNOSIS — Z0289 Encounter for other administrative examinations: Secondary | ICD-10-CM

## 2014-03-14 ENCOUNTER — Other Ambulatory Visit: Payer: Self-pay

## 2014-03-14 DIAGNOSIS — Z1231 Encounter for screening mammogram for malignant neoplasm of breast: Secondary | ICD-10-CM

## 2014-03-28 ENCOUNTER — Ambulatory Visit: Payer: Federal, State, Local not specified - PPO

## 2014-04-03 ENCOUNTER — Ambulatory Visit
Admission: RE | Admit: 2014-04-03 | Discharge: 2014-04-03 | Disposition: A | Discharge status: Home / Self Care | Payer: Federal, State, Local not specified - PPO | Source: Ambulatory Visit

## 2014-04-03 DIAGNOSIS — Z1231 Encounter for screening mammogram for malignant neoplasm of breast: Secondary | ICD-10-CM

## 2014-08-15 ENCOUNTER — Encounter: Payer: Self-pay | Admitting: Gynecology

## 2014-08-29 ENCOUNTER — Encounter: Payer: Self-pay | Admitting: Gynecology

## 2014-09-08 ENCOUNTER — Encounter: Payer: Self-pay | Admitting: Gynecology

## 2014-09-17 ENCOUNTER — Encounter: Payer: Self-pay | Admitting: Gynecology

## 2014-09-17 ENCOUNTER — Ambulatory Visit (INDEPENDENT_AMBULATORY_CARE_PROVIDER_SITE_OTHER): Payer: Federal, State, Local not specified - PPO | Admitting: Gynecology

## 2014-09-17 ENCOUNTER — Other Ambulatory Visit (HOSPITAL_COMMUNITY)
Admission: RE | Admit: 2014-09-17 | Discharge: 2014-09-17 | Disposition: A | Discharge status: Home / Self Care | Payer: Federal, State, Local not specified - PPO | Source: Ambulatory Visit | Attending: Gynecology | Admitting: Gynecology

## 2014-09-17 VITALS — BP 128/84 | Ht 60.5 in | Wt 187.0 lb

## 2014-09-17 DIAGNOSIS — N951 Menopausal and female climacteric states: Secondary | ICD-10-CM

## 2014-09-17 DIAGNOSIS — Z1151 Encounter for screening for human papillomavirus (HPV): Secondary | ICD-10-CM | POA: Diagnosis present

## 2014-09-17 DIAGNOSIS — Z1159 Encounter for screening for other viral diseases: Secondary | ICD-10-CM

## 2014-09-17 DIAGNOSIS — Z01419 Encounter for gynecological examination (general) (routine) without abnormal findings: Secondary | ICD-10-CM | POA: Diagnosis not present

## 2014-09-17 DIAGNOSIS — Z79890 Hormone replacement therapy (postmenopausal): Secondary | ICD-10-CM

## 2014-09-17 HISTORY — DX: Menopausal and female climacteric states: N95.1

## 2014-09-17 MED ORDER — PROGESTERONE MICRONIZED 200 MG PO CAPS: 200.0000 mg | ORAL_CAPSULE | Freq: Every day | ORAL | Status: DC

## 2014-09-17 MED ORDER — ESTRADIOL 0.1 MG/24HR TD PTWK: 0.1000 mg | MEDICATED_PATCH | TRANSDERMAL | Status: DC

## 2014-09-17 NOTE — Patient Instructions (Addendum)
Hormone Therapy At menopause, your body begins making less estrogen and progesterone hormones. This causes the body to stop having menstrual periods. This is because estrogen and progesterone hormones control your periods and menstrual cycle. A lack of estrogen may cause symptoms such as:  Hot flushes (or hot flashes).  Vaginal dryness.  Dry skin.  Loss of sex drive.  Risk of bone loss (osteoporosis). When this happens, you may choose to take hormone therapy to get back the estrogen lost during menopause. When the hormone estrogen is given alone, it is usually referred to as ET (Estrogen Therapy). When the hormone progestin is combined with estrogen, it is generally called HT (Hormone Therapy). This was formerly known as hormone replacement therapy (HRT). Your caregiver can help you make a decision on what will be best for you. The decision to use HT seems to change often as new studies are done. Many studies do not agree on the benefits of hormone replacement therapy. LIKELY BENEFITS OF HT INCLUDE PROTECTION FROM:  Hot Flushes (also called hot flashes) - A hot flush is a sudden feeling of heat that spreads over the face and body. The skin may redden like a blush. It is connected with sweats and sleep disturbance. Women going through menopause may have hot flushes a few times a month or several times per day depending on the woman.  Osteoporosis (bone loss)- Estrogen helps guard against bone loss. After menopause, a woman's bones slowly lose calcium and become weak and brittle. As a result, bones are more likely to break. The hip, wrist, and spine are affected most often. Hormone therapy can help slow bone loss after menopause. Weight bearing exercise and taking calcium with vitamin D also can help prevent bone loss. There are also medications that your caregiver can prescribe that can help prevent osteoporosis.  Vaginal Dryness - Loss of estrogen causes changes in the vagina. Its lining may  become thin and dry. These changes can cause pain and bleeding during sexual intercourse. Dryness can also lead to infections. This can cause burning and itching. (Vaginal estrogen treatment can help relieve pain, itching, and dryness.)  Urinary Tract Infections are more common after menopause because of lack of estrogen. Some women also develop urinary incontinence because of low estrogen levels in the vagina and bladder.  Possible other benefits of estrogen include a positive effect on mood and short-term memory in women. RISKS AND COMPLICATIONS  Using estrogen alone without progesterone causes the lining of the uterus to grow. This increases the risk of lining of the uterus (endometrial) cancer. Your caregiver should give another hormone called progestin if you have a uterus.  Women who take combined (estrogen and progestin) HT appear to have an increased risk of breast cancer. The risk appears to be small, but increases throughout the time that HT is taken.  Combined therapy also makes the breast tissue slightly denser which makes it harder to read mammograms (breast X-rays).  Combined, estrogen and progesterone therapy can be taken together every day, in which case there may be spotting of blood. HT therapy can be taken cyclically in which case you will have menstrual periods. Cyclically means HT is taken for a set amount of days, then not taken, then this process is repeated.  HT may increase the risk of stroke, heart attack, breast cancer and forming blood clots in your leg.  Transdermal estrogen (estrogen that is absorbed through the skin with a patch or a cream) may have more positive results with:    Cholesterol.  Blood pressure.  Blood clots. Having the following conditions may indicate you should not have HT:  Endometrial cancer.  Liver disease.  Breast cancer.  Heart disease.  History of blood clots.  Stroke. TREATMENT   If you choose to take HT and have a uterus,  usually estrogen and progestin are prescribed.  Your caregiver will help you decide the best way to take the medications.  Possible ways to take estrogen include:  Pills.  Patches.  Gels.  Sprays.  Vaginal estrogen cream, rings and tablets.  It is best to take the lowest dose possible that will help your symptoms and take them for the shortest period of time that you can.  Hormone therapy can help relieve some of the problems (symptoms) that affect women at menopause. Before making a decision about HT, talk to your caregiver about what is best for you. Be well informed and comfortable with your decisions. HOME CARE INSTRUCTIONS   Follow your caregivers advice when taking the medications.  A Pap test is done to screen for cervical cancer.  The first Pap test should be done at age 21.  Between ages 21 and 29, Pap tests are repeated every 2 years.  Beginning at age 30, you are advised to have a Pap test every 3 years as long as your past 3 Pap tests have been normal.  Some women have medical problems that increase the chance of getting cervical cancer. Talk to your caregiver about these problems. It is especially important to talk to your caregiver if a new problem develops soon after your last Pap test. In these cases, your caregiver may recommend more frequent screening and Pap tests.  The above recommendations are the same for women who have or have not gotten the vaccine for HPV (Human Papillomavirus).  If you had a hysterectomy for a problem that was not a cancer or a condition that could lead to cancer, then you no longer need Pap tests. However, even if you no longer need a Pap test, a regular exam is a good idea to make sure no other problems are starting.   If you are between ages 65 and 70, and you have had normal Pap tests going back 10 years, you no longer need Pap tests. However, even if you no longer need a Pap test, a regular exam is a good idea to make sure no  other problems are starting.   If you have had past treatment for cervical cancer or a condition that could lead to cancer, you need Pap tests and screening for cancer for at least 20 years after your treatment.  If Pap tests have been discontinued, risk factors (such as a new sexual partner) need to be re-assessed to determine if screening should be resumed.  Some women may need screenings more often if they are at high risk for cervical cancer.  Get mammograms done as per the advice of your caregiver. SEEK IMMEDIATE MEDICAL CARE IF:  You develop abnormal vaginal bleeding.  You have pain or swelling in your legs, shortness of breath, or chest pain.  You develop dizziness or headaches.  You have lumps or changes in your breasts or armpits.  You have slurred speech.  You develop weakness or numbness of your arms or legs.  You have pain, burning, or bleeding when urinating.  You develop abdominal pain. Document Released: 07/23/2003 Document Revised: 01/16/2012 Document Reviewed: 11/10/2010 [employer] Patient Information 2015 [employer]. This information is not intended to   replace advice given to you by your health care provider. Make sure you discuss any questions you have with your health care provider. Estradiol skin patches What is this medicine? ESTRADIOL (es tra DYE ole) skin patches contain an estrogen. It is mostly used as hormone replacement in menopausal women. It helps to treat hot flashes and prevent osteoporosis. It is also used to treat women with low estrogen levels or those who have had their ovaries removed. This medicine may be used for other purposes; ask your health care provider or pharmacist if you have questions. COMMON BRAND NAME(S): Alora, Climara, Esclim, Estraderm, FemPatch, Menostar, Minivelle, Vivelle, Vivelle-Dot What should I tell my health care provider before I take this medicine? They need to know if you have any of these conditions: -abnormal  vaginal bleeding -blood vessel disease or blood clots -breast, cervical, endometrial, ovarian, liver, or uterine cancer -dementia -diabetes -gallbladder disease -heart disease or recent heart attack -high blood pressure -high cholesterol -high level of calcium in the blood -hysterectomy -kidney disease -liver disease -migraine headaches -protein C deficiency -protein S deficiency -stroke -systemic lupus erythematosus (SLE) -tobacco smoker -an unusual or allergic reaction to estrogens, other hormones, medicines, foods, dyes, or preservatives -pregnant or trying to get pregnant -breast-feeding How should I use this medicine? This medicine is for external use only. Follow the directions on the prescription label. Tear open the pouch, do not use scissors. Remove the stiff protective liner covering the adhesive. Try not to touch the adhesive. Apply the patch, sticky side to the skin, to an area that is clean, dry and hairless. Avoid injured, irritated, calloused, or scarred areas. Do not apply the skin patches to your breasts or around the waistline. Use a different site each time to prevent skin irritation. Do not cut or trim the patch. Do not stop using except on the advice of your doctor or health care professional. Do not wear more than one patch at a time unless you are told to do so by your doctor or health care professional. Contact your pediatrician regarding the use of this medicine in children. Special care may be needed. A patient package insert for the product will be given with each prescription and refill. Read this sheet carefully each time. The sheet may change frequently. Overdosage: If you think you have taken too much of this medicine contact a poison control center or emergency room at once. NOTE: This medicine is only for you. Do not share this medicine with others. What if I miss a dose? If you miss a dose, apply it as soon as you can. If it is almost time for your next  dose, apply only that dose. Do not apply double or extra doses. What may interact with this medicine? Do not take this medicine with any of the following medications: -aromatase inhibitors like aminoglutethimide, anastrozole, exemestane, letrozole, testolactone This medicine may also interact with the following medications: -carbamazepine -certain antibiotics used to treat infections -certain barbiturates used for inducing sleep or treating seizures -grapefruit juice -medicines for fungus infections like itraconazole and ketoconazole -raloxifene or tamoxifen -rifabutin, rifampin, or rifapentine -ritonavir -St. John's Wort This list may not describe all possible interactions. Give your health care provider a list of all the medicines, herbs, non-prescription drugs, or dietary supplements you use. Also tell them if you smoke, drink alcohol, or use illegal drugs. Some items may interact with your medicine. What should I watch for while using this medicine? Visit your doctor or health care professional for  regular checks on your progress. You will need a regular breast and pelvic exam and Pap smear while on this medicine. You should also discuss the need for regular mammograms with your health care professional, and follow his or her guidelines for these tests. This medicine can make your body retain fluid, making your fingers, hands, or ankles swell. Your blood pressure can go up. Contact your doctor or health care professional if you feel you are retaining fluid. If you have any reason to think you are pregnant, stop taking this medicine right away and contact your doctor or health care professional. Smoking increases the risk of getting a blood clot or having a stroke while you are taking this medicine, especially if you are more than 52 years old. You are strongly advised not to smoke. If you wear contact lenses and notice visual changes, or if the lenses begin to feel uncomfortable, consult your  eye doctor or health care professional. This medicine can increase the risk of developing a condition (endometrial hyperplasia) that may lead to cancer of the lining of the uterus. Taking progestins, another hormone drug, with this medicine lowers the risk of developing this condition. Therefore, if your uterus has not been removed (by a hysterectomy), your doctor may prescribe a progestin for you to take together with your estrogen. You should know, however, that taking estrogens with progestins may have additional health risks. You should discuss the use of estrogens and progestins with your health care professional to determine the benefits and risks for you. If you are going to have surgery or an MRI, you may need to stop taking this medicine. Consult your health care professional for advice before you schedule the surgery. You may bathe or participate in other activities while wearing your patch. If the patch pulls loose or falls off, you may reapply it if the patch is sticky enough to stay on the skin. You should reapply the patch in a different area. Use a fresh patch if it will no longer stick. What side effects may I notice from receiving this medicine? Side effects that you should report to your doctor or health care professional as soon as possible: -allergic reactions like skin rash, itching or hives, swelling of the face, lips, or tongue -breast tissue changes or discharge -changes in vision -chest pain -confusion, trouble speaking or understanding -dark urine -general ill feeling or flu-like symptoms -light-colored stools -nausea, vomiting -pain, swelling, warmth in the leg -right upper belly pain -severe headaches -shortness of breath -sudden numbness or weakness of the face, arm or leg -trouble walking, dizziness, loss of balance or coordination -unusual vaginal bleeding -yellowing of the eyes or skin Side effects that usually do not require medical attention (report to your  doctor or health care professional if they continue or are bothersome): -hair loss -increased hunger or thirst -increased urination -symptoms of vaginal infection like itching, irritation or unusual discharge -unusually weak or tired This list may not describe all possible side effects. Call your doctor for medical advice about side effects. You may report side effects to FDA at 1-800-FDA-1088. Where should I keep my medicine? Keep out of the reach of children. Store at room temperature below 30 degrees C (86 degrees F). Do not store any patches that have been removed from their protective pouch. Throw away any unused medicine after the expiration date. Dispose of used patches properly. Since used patches may still contain active hormones, fold the patch in half so that it sticks to  itself prior to disposal. NOTE: This sheet is a summary. It may not cover all possible information. If you have questions about this medicine, talk to your doctor, pharmacist, or health care provider.  2015, Elsevier/Gold Standard. (2011-01-26 09:19:41) Bone Densitometry Bone densitometry is a special X-ray that measures your bone density and can be used to help predict your risk of bone fractures. This test is used to determine bone mineral content and density to diagnose osteoporosis. Osteoporosis is the loss of bone that may cause the bone to become weak. Osteoporosis commonly occurs in women entering menopause. However, it may be found in men and in people with other diseases. PREPARATION FOR TEST No preparation necessary. WHO SHOULD BE TESTED?  All women older than 70.  Postmenopausal women (50 to 94) with risk factors for osteoporosis.  People with a previous fracture caused by normal activities.  People with a small body frame (less than 127 poundsor a body mass index [BMI] of less than 21).  People who have a parent with a hip fracture or history of osteoporosis.  People who smoke.  People who have  rheumatoid arthritis.  Anyone who engages in excessive alcohol use (more than 3 drinks most days).  Women who experience early menopause. WHEN SHOULD YOU BE RETESTED? Current guidelines suggest that you should wait at least 2 years before doing a bone density test again if your first test was normal.Recent studies indicated that women with normal bone density may be able to wait a few years before needing to repeat a bone density test. You should discuss this with your caregiver.  NORMAL FINDINGS   Normal: less than standard deviation below normal (greater than -1).  Osteopenia: 1 to 2.5 standard deviations below normal (-1 to -2.5).  Osteoporosis: greater than 2.5 standard deviations below normal (less than -2.5). Test results are reported as a "T score" and a "Z score."The T score is a number that compares your bone density with the bone density of healthy, young women.The Z score is a number that compares your bone density with the scores of women who are the same age, gender, and race.  Ranges for normal findings may vary among different laboratories and hospitals. You should always check with your doctor after having lab work or other tests done to discuss the meaning of your test results and whether your values are considered within normal limits. MEANING OF TEST  Your caregiver will go over the test results with you and discuss the importance and meaning of your results, as well as treatment options and the need for additional tests if necessary. OBTAINING THE TEST RESULTS It is your responsibility to obtain your test results. Ask the lab or department performing the test when and how you will get your results. Document Released: 11/15/2004 Document Revised: 01/16/2012 Document Reviewed: 12/08/2010 [facility] Patient Information 2015 [place], [place]. This information is not intended to replace advice given to you by your health care provider. Make sure you discuss any questions you have  with your health care provider.

## 2014-09-17 NOTE — Progress Notes (Signed)
Carol Franklin 1961/06/29 [id number]   History:    53 y.o.  Who presented to the office today with vasomotor symptoms consisting of hot flashes, irritability, mood swing, vaginal dryness, insomnia and dyspareunia.patient has not been seen since 2013. She had been tested for her [facility] which is found to be elevated at 87. She was very hesitant starting any hormone replacement therapy or any SSRIs that had been discussed. She was found to be hypertensive and had been placed on HCTZ 12.5 mg daily which she took for a year stopped on her own has not seen any primary care physician. She did have a mammogram this year was normal she has not had a bone density study yet. She has not had a colonoscopy yet as well. Patient declined flu vaccine today.  Past medical history,surgical history, family history and social history were all reviewed and documented in the EPIC chart.  Gynecologic History Patient's last menstrual period was 03/08/2011. Contraception: post menopausal status Last Pap: 2011. Results were: normal Last mammogram: 2015. Results were: normal  Obstetric History OB History  Gravida Para Term Preterm AB SAB TAB Ectopic Multiple Living  4 2 2  2 2    2     # Outcome Date GA Lbr Len/2nd Weight Sex Delivery Anes PTL Lv  4 SAB           3 SAB           2 Term     M CS-Unspec  N Y  1 Term     M CS-Unspec  N Y       ROS: A ROS was performed and pertinent positives and negatives are included in the history.  GENERAL: No fevers or chills. HEENT: No change in vision, no earache, sore throat or sinus congestion. NECK: No pain or stiffness. CARDIOVASCULAR: No chest pain or pressure. No palpitations. PULMONARY: No shortness of breath, cough or wheeze. GASTROINTESTINAL: No abdominal pain, nausea, vomiting or diarrhea, melena or bright red blood per rectum. GENITOURINARY: No urinary frequency, urgency, hesitancy or dysuria. MUSCULOSKELETAL: No joint or muscle pain, no back pain, no recent trauma.  DERMATOLOGIC: No rash, no itching, no lesions. ENDOCRINE: No polyuria, polydipsia, no heat or cold intolerance. No recent change in weight. HEMATOLOGICAL: No anemia or easy bruising or bleeding. NEUROLOGIC: No headache, seizures, numbness, tingling or weakness. PSYCHIATRIC: No depression, no loss of interest in normal activity or change in sleep pattern.     Exam: chaperone present  BP 128/84 mmHg  Ht 5' 0.5" (1.537 m)  Wt 187 lb (84.823 kg)  BMI 35.91 kg/m2  LMP 03/08/2011  Body mass index is 35.91 kg/(m^2).  General appearance : Well developed well nourished female. No acute distress HEENT: Neck supple, trachea midline, no carotid bruits, no thyroidmegaly Lungs: Clear to auscultation, no rhonchi or wheezes, or rib retractions  Heart: Regular rate and rhythm, no murmurs or gallops Breast:Examined in sitting and supine position were symmetrical in appearance, no palpable masses or tenderness,  no skin retraction, no nipple inversion, no nipple discharge, no skin discoloration, no axillary or supraclavicular lymphadenopathy Abdomen: no palpable masses or tenderness, no rebound or guarding Extremities: no edema or skin discoloration or tenderness  Pelvic:  Bartholin, Urethra, Skene Glands: Within normal limits             Vagina: No gross lesions or discharge  Cervix: No gross lesions or discharge  Uterus  anteverted, normal size, shape and consistency, non-tender and mobile  Adnexa  Without masses or tenderness  Anus and perineum  normal   Rectovaginal  normal sphincter tone without palpated masses or tenderness             Hemoccult colonoscopy this year     Assessment/Plan:  53 y.o. female for annual exam who has not been seen the office since 2013. Patient with worsening vasomotor symptoms. We had a discussion of the women's health initiative study. We discussed the risk, benefits, pros and cons of HRT. Patient fully accepts and would like to begin. We discussed the oral versus  transdermal treatment. She is going to be started on Climara 0.1 mg which she will apply transdermally weekly with the addition of Prometrium 200 mg for the first 12 days of the month. We discussed importance of calcium vitamin D and regular exercise for osteoporosis prevention. Pap smear was done today with HPV screen. She is to receive the T.vaccine. She will schedule a bone density study here in the office in the next few weeks. She was given the name of our local gastroenterologist for her to schedule a screening colonoscopy. She will return back to the office this week in a fasting state for the following labs: Fasting lipid profile, comprehensive metabolic panel, CBC, TSH, and urinalysis. We discussed importance of calcium and vitamin D and regular exercise for osteoporosis prevention. We also discussed importance of monthly self breast examination.   Terrance Mass MD, 4:58 PM 09/17/2014

## 2014-09-18 ENCOUNTER — Encounter: Payer: Self-pay | Admitting: Gynecology

## 2014-09-18 DIAGNOSIS — R7989 Other specified abnormal findings of blood chemistry: Secondary | ICD-10-CM | POA: Insufficient documentation

## 2014-09-18 DIAGNOSIS — D75838 Other thrombocytosis: Secondary | ICD-10-CM | POA: Insufficient documentation

## 2014-09-18 LAB — URINALYSIS W MICROSCOPIC + REFLEX CULTURE
BACTERIA UA: NONE SEEN
BILIRUBIN URINE: NEGATIVE
CASTS: NONE SEEN
CRYSTALS: NONE SEEN
Glucose, UA: NEGATIVE mg/dL
Hgb urine dipstick: NEGATIVE
KETONES UR: NEGATIVE mg/dL
Leukocytes, UA: NEGATIVE
Nitrite: NEGATIVE
PH: 6.5 (ref 5.0–8.0)
Protein, ur: NEGATIVE mg/dL
SPECIFIC GRAVITY, URINE: 1.007 (ref 1.005–1.030)
SQUAMOUS EPITHELIAL / LPF: NONE SEEN
UROBILINOGEN UA: 0.2 mg/dL (ref 0.0–1.0)

## 2014-09-19 ENCOUNTER — Other Ambulatory Visit: Payer: Federal, State, Local not specified - PPO

## 2014-09-19 LAB — CBC WITH DIFFERENTIAL/PLATELET
BASOS ABS: 0 10*3/uL (ref 0.0–0.1)
Basophils Relative: 0 % (ref 0–1)
EOS PCT: 2 % (ref 0–5)
Eosinophils Absolute: 0.2 10*3/uL (ref 0.0–0.7)
HEMATOCRIT: 39.2 % (ref 36.0–46.0)
HEMOGLOBIN: 13.2 g/dL (ref 12.0–15.0)
LYMPHS PCT: 40 % (ref 12–46)
Lymphs Abs: 3.2 10*3/uL (ref 0.7–4.0)
MCH: 29.5 pg (ref 26.0–34.0)
MCHC: 33.7 g/dL (ref 30.0–36.0)
MCV: 87.7 fL (ref 78.0–100.0)
MONO ABS: 0.5 10*3/uL (ref 0.1–1.0)
MONOS PCT: 6 % (ref 3–12)
NEUTROS ABS: 4.1 10*3/uL (ref 1.7–7.7)
Neutrophils Relative %: 52 % (ref 43–77)
Platelets: 548 10*3/uL — ABNORMAL HIGH (ref 150–400)
RBC: 4.47 MIL/uL (ref 3.87–5.11)
RDW: 14.2 % (ref 11.5–15.5)
WBC: 7.9 10*3/uL (ref 4.0–10.5)

## 2014-09-19 LAB — COMPREHENSIVE METABOLIC PANEL
ALBUMIN: 3.8 g/dL (ref 3.5–5.2)
ALT: 14 U/L (ref 0–35)
AST: 17 U/L (ref 0–37)
Alkaline Phosphatase: 87 U/L (ref 39–117)
BUN: 12 mg/dL (ref 6–23)
CALCIUM: 9.2 mg/dL (ref 8.4–10.5)
CHLORIDE: 105 meq/L (ref 96–112)
CO2: 24 meq/L (ref 19–32)
CREATININE: 0.62 mg/dL (ref 0.50–1.10)
GLUCOSE: 93 mg/dL (ref 70–99)
POTASSIUM: 3.8 meq/L (ref 3.5–5.3)
Sodium: 139 mEq/L (ref 135–145)
Total Bilirubin: 0.5 mg/dL (ref 0.2–1.2)
Total Protein: 7.1 g/dL (ref 6.0–8.3)

## 2014-09-19 LAB — LIPID PANEL
CHOL/HDL RATIO: 3.1 ratio
CHOLESTEROL: 185 mg/dL (ref 0–200)
HDL: 59 mg/dL (ref >39)
LDL Cholesterol: 109 mg/dL — ABNORMAL HIGH (ref 0–99)
TRIGLYCERIDES: 84 mg/dL (ref <150)
VLDL: 17 mg/dL (ref 0–40)

## 2014-09-19 LAB — TSH: TSH: 1.923 u[IU]/mL (ref 0.350–4.500)

## 2014-09-19 LAB — CYTOLOGY - PAP

## 2014-09-19 LAB — HEPATITIS C ANTIBODY: HCV Ab: NEGATIVE

## 2014-09-30 ENCOUNTER — Telehealth: Payer: Self-pay | Admitting: *Deleted

## 2014-09-30 ENCOUNTER — Ambulatory Visit (INDEPENDENT_AMBULATORY_CARE_PROVIDER_SITE_OTHER): Payer: Federal, State, Local not specified - PPO

## 2014-09-30 ENCOUNTER — Other Ambulatory Visit: Payer: Self-pay | Admitting: Gynecology

## 2014-09-30 DIAGNOSIS — Z79890 Hormone replacement therapy (postmenopausal): Secondary | ICD-10-CM

## 2014-09-30 DIAGNOSIS — N951 Menopausal and female climacteric states: Secondary | ICD-10-CM

## 2014-09-30 DIAGNOSIS — M8580 Other specified disorders of bone density and structure, unspecified site: Secondary | ICD-10-CM

## 2014-09-30 NOTE — Telephone Encounter (Signed)
Per JF notes pt will need appointment with hematology platelet count in the past 4 years has continued to rise. I called Tiffany and left message at [facility] health and asked if pt can call or if I need to send a new patient referral form faxed.

## 2014-10-10 NOTE — Telephone Encounter (Signed)
Referral faxed to cancer center on 12.2.15, they will contact patient to schedule.

## 2014-10-14 ENCOUNTER — Other Ambulatory Visit: Payer: Self-pay | Admitting: Sports Medicine

## 2014-10-14 ENCOUNTER — Telehealth: Payer: Self-pay | Admitting: Internal Medicine

## 2014-10-14 DIAGNOSIS — M545 Low back pain, unspecified: Secondary | ICD-10-CM

## 2014-10-14 NOTE — Telephone Encounter (Signed)
LEFT MESSAGE FOR PATIENT TO RETURN CALL TO SCHEDULE APPT.  °

## 2014-10-17 NOTE — Telephone Encounter (Signed)
Cancer center left message on 10/14/14 to schedule the below

## 2014-10-20 ENCOUNTER — Encounter: Payer: Self-pay | Admitting: Gynecology

## 2014-10-24 ENCOUNTER — Other Ambulatory Visit: Payer: Federal, State, Local not specified - PPO

## 2014-11-04 NOTE — Telephone Encounter (Signed)
Appointment 11/05/14 @ 1:45pm

## 2014-11-05 ENCOUNTER — Ambulatory Visit: Payer: Federal, State, Local not specified - PPO

## 2014-11-05 ENCOUNTER — Ambulatory Visit (HOSPITAL_BASED_OUTPATIENT_CLINIC_OR_DEPARTMENT_OTHER): Payer: Federal, State, Local not specified - PPO

## 2014-11-05 ENCOUNTER — Other Ambulatory Visit: Payer: Federal, State, Local not specified - PPO

## 2014-11-05 ENCOUNTER — Encounter: Payer: Self-pay | Admitting: Internal Medicine

## 2014-11-05 ENCOUNTER — Telehealth: Payer: Self-pay

## 2014-11-05 ENCOUNTER — Telehealth: Payer: Self-pay | Admitting: Internal Medicine

## 2014-11-05 ENCOUNTER — Other Ambulatory Visit: Payer: Self-pay | Admitting: Internal Medicine

## 2014-11-05 ENCOUNTER — Ambulatory Visit (HOSPITAL_BASED_OUTPATIENT_CLINIC_OR_DEPARTMENT_OTHER): Payer: Federal, State, Local not specified - PPO | Admitting: Internal Medicine

## 2014-11-05 ENCOUNTER — Other Ambulatory Visit: Payer: Self-pay | Admitting: *Deleted

## 2014-11-05 VITALS — BP 128/79 | HR 79 | Temp 97.8°F | Resp 19 | Ht 60.0 in | Wt 193.7 lb

## 2014-11-05 DIAGNOSIS — D473 Essential (hemorrhagic) thrombocythemia: Secondary | ICD-10-CM

## 2014-11-05 DIAGNOSIS — I10 Essential (primary) hypertension: Secondary | ICD-10-CM

## 2014-11-05 LAB — COMPREHENSIVE METABOLIC PANEL (CC13)
ALK PHOS: 84 U/L (ref 40–150)
ALT: 14 U/L (ref 0–55)
AST: 17 U/L (ref 5–34)
Albumin: 3.6 g/dL (ref 3.5–5.0)
Anion Gap: 7 mEq/L (ref 3–11)
BILIRUBIN TOTAL: 0.54 mg/dL (ref 0.20–1.20)
BUN: 9.3 mg/dL (ref 7.0–26.0)
CO2: 28 meq/L (ref 22–29)
CREATININE: 0.7 mg/dL (ref 0.6–1.1)
Calcium: 9.6 mg/dL (ref 8.4–10.4)
Chloride: 105 mEq/L (ref 98–109)
EGFR: >90 mL/min/{1.73_m2} (ref >90)
GLUCOSE: 89 mg/dL (ref 70–140)
Potassium: 3.5 mEq/L (ref 3.5–5.1)
Sodium: 141 mEq/L (ref 136–145)
Total Protein: 7.4 g/dL (ref 6.4–8.3)

## 2014-11-05 LAB — CBC WITH DIFFERENTIAL/PLATELET
BASO%: 0.8 % (ref 0.0–2.0)
Basophils Absolute: 0.1 10*3/uL (ref 0.0–0.1)
EOS ABS: 0.1 10*3/uL (ref 0.0–0.5)
EOS%: 1.3 % (ref 0.0–7.0)
HEMATOCRIT: 40.9 % (ref 34.8–46.6)
HGB: 12.8 g/dL (ref 11.6–15.9)
LYMPH%: 34.8 % (ref 14.0–49.7)
MCH: 28.7 pg (ref 25.1–34.0)
MCHC: 31.2 g/dL — AB (ref 31.5–36.0)
MCV: 91.9 fL (ref 79.5–101.0)
MONO#: 0.7 10*3/uL (ref 0.1–0.9)
MONO%: 7.9 % (ref 0.0–14.0)
NEUT#: 5.1 10*3/uL (ref 1.5–6.5)
NEUT%: 55.2 % (ref 38.4–76.8)
PLATELETS: 499 10*3/uL — AB (ref 145–400)
RBC: 4.45 10*6/uL (ref 3.70–5.45)
RDW: 14.4 % (ref 11.2–14.5)
WBC: 9.2 10*3/uL (ref 3.9–10.3)
lymph#: 3.2 10*3/uL (ref 0.9–3.3)

## 2014-11-05 LAB — LACTATE DEHYDROGENASE: LDH: 239 U/L (ref 94–250)

## 2014-11-05 MED ORDER — PROGESTERONE MICRONIZED 200 MG PO CAPS: ORAL_CAPSULE | ORAL | Status: DC

## 2014-11-05 NOTE — Progress Notes (Signed)
[place] [phone]   [phone]  CONSULT NOTE  REFERRING PHYSICIAN: Dr. Uvaldo Rising  REASON FOR CONSULTATION:  Persistent thrombocytosis.  HPI JEFF MCCALLUM is a 53 y.o. female with no significant past medical history except for hypertension. She has a known history of thrombocytopenia for at least 6-7 years. I saw the patient in the past in November 2009 for evaluation of this condition. I ordered several studies at that time including iron study, ferritin, ESR as well as Barnabas Lister 2 mutation. Her previous blood work was unremarkable for any myeloproliferative disorder. The patient was advised to continue with observation and close monitoring by her primary care physician. She was also advised to take baby aspirin on daily basis. Her platelets count has always been in the range of 500K-600K. The patient was seen recently by Dr. Toney Rakes and because of the persistent thrombocytosis, he referred her back to me for evaluation. The patient is feeling fine and has no significant complaints.  When seen today she denied having any significant weight loss or night sweats. She has no fever or chills, no nausea or vomiting. The patient denied having any significant chest pain, shortness breath, cough or hemoptysis. She denied having any bleeding issues. No significant change in her past medical history or family history. HPI  Past Medical History  Diagnosis Date  . Intertrigo   . Hypertension   . Reactive thrombocytosis 2011    Past Surgical History  Procedure Laterality Date  . Cesarean section      X2  . Knee surgery      left    Family History  Problem Relation Age of Onset  . Hypertension Father   . Heart disease Father   . Breast cancer Maternal Aunt   . Diabetes Maternal Grandmother   . Cancer Paternal Grandmother     colon    Social History History  Substance Use Topics  . Smoking status: Never Smoker   . Smokeless tobacco: Never  Used  . Alcohol Use: 0.0 oz/week    0 Not specified per week     Comment: rare    Allergies  Allergen Reactions  . Naproxen Anxiety    Current Outpatient Prescriptions  Medication Sig Dispense Refill  . aspirin 81 MG tablet Take 81 mg by mouth every other day.    . calcium carbonate (OS-CAL) 600 MG TABS Take 600 mg by mouth 2 (two) times daily with a meal.    . estradiol (CLIMARA) 0.1 mg/24hr patch Place 1 patch (0.1 mg total) onto the skin once a week. 4 patch 12  . progesterone (PROMETRIUM) 200 MG capsule Take one daily D1-12 of the month. 36 capsule 4   No current facility-administered medications for this visit.    Review of Systems  Constitutional: negative Eyes: negative Ears, nose, mouth, throat, and face: negative Respiratory: negative Cardiovascular: negative Gastrointestinal: negative Genitourinary:negative Integument/breast: negative Hematologic/lymphatic: negative Musculoskeletal:negative Neurological: negative Behavioral/Psych: negative Endocrine: negative Allergic/Immunologic: negative  Physical Exam  ERX:VQMGQ, healthy, no distress, well nourished and well developed SKIN: skin color, texture, turgor are normal, no rashes or significant lesions HEAD: Normocephalic, No masses, lesions, tenderness or abnormalities EYES: normal, PERRLA EARS: External ears normal, Canals clear OROPHARYNX:no exudate, no erythema and lips, buccal mucosa, and tongue normal  NECK: supple, no adenopathy, no JVD LYMPH:  no palpable lymphadenopathy, no hepatosplenomegaly BREAST:not examined LUNGS: clear to auscultation , and palpation HEART: regular rate & rhythm and no murmurs ABDOMEN:abdomen soft, non-tender, obese,  normal bowel sounds and no masses or organomegaly BACK: Back symmetric, no curvature., No CVA tenderness EXTREMITIES:no joint deformities, effusion, or inflammation, no edema, no skin discoloration  NEURO: alert & oriented x 3 with fluent speech, no focal  motor/sensory deficits  PERFORMANCE STATUS: ECOG 0  LABORATORY DATA: Lab Results  Component Value Date   WBC 7.9 09/19/2014   HGB 13.2 09/19/2014   HCT 39.2 09/19/2014   MCV 87.7 09/19/2014   PLT 548* 09/19/2014      Chemistry      Component Value Date/Time   NA 139 09/19/2014 0847   K 3.8 09/19/2014 0847   CL 105 09/19/2014 0847   CO2 24 09/19/2014 0847   BUN 12 09/19/2014 0847   CREATININE 0.62 09/19/2014 0847   CREATININE 0.65 09/10/2008 1339      Component Value Date/Time   CALCIUM 9.2 09/19/2014 0847   ALKPHOS 87 09/19/2014 0847   AST 17 09/19/2014 0847   ALT 14 09/19/2014 0847   BILITOT 0.5 09/19/2014 0847       RADIOGRAPHIC STUDIES: No results found.  ASSESSMENT: This is a very pleasant 53 years old female with persistent thrombocytosis for years of unclear etiology but most likely reactive in nature. The previous workup showed no evidence for myeloproliferative disorder. She has been observation for the last 5 years with no significant complaints or issues related to her thrombocytosis.  PLAN: I had a lengthy discussion with the patient today about her condition. I recommended for her to repeat the previous bloodwork to see of there is any change over the last few years. I ordered repeat CBC, comprehensive metabolic panel, LDH, iron study, ferritin as well as Barnabas Lister 2 mutation. If there is no significant abnormalities in her blood work, the patient will continue her routine follow-up visit with her primary care physician. I don't see a need to continue seeing the patient at regular basis but I will be happy to see her in the future if there is any concerning findings.  The patient voices understanding of current disease status and treatment options and is in agreement with the current care plan.  All questions were answered. The patient knows to call the clinic with any problems, questions or concerns. We can certainly see the patient much sooner if  necessary.  Thank you so much for allowing me to participate in the care of [place]. I will continue to follow up the patient with you and assist in her care.  I spent 35 minutes counseling the patient face to face. The total time spent in the appointment was 55 minutes.  Disclaimer: This note was dictated with voice recognition software. Similar sounding words can inadvertently be transcribed and may not be corrected upon review.   Alamin Mccuiston K. 11/05/2014, 2:39 PM

## 2014-11-05 NOTE — Telephone Encounter (Signed)
Patient mentioned she had trouble scheduling screening colonoscopy. Was told "they did not see anything in the system for it".  I called over to [facility] and scheduled patient for 01/06/15 at 8:30am for colonoscopy and nurse appt to be 12/23/14 9:00am.  Appt is with Dr. Ardis Hughs. I notified patient of these dates/times and to expect something in the mail from their office.

## 2014-11-05 NOTE — Telephone Encounter (Signed)
Climara patch once weekly. Prometrium first 12 days of the month

## 2014-11-05 NOTE — Telephone Encounter (Signed)
Pt confirmed lab add on per 12/30 POF, gave pt AVS.... KJ, sent msg to [facility] lab ad on

## 2014-11-05 NOTE — Telephone Encounter (Signed)
Patient advised. Correction made to directions and corrected Rx sent to pharmacy.

## 2014-11-05 NOTE — Telephone Encounter (Signed)
LM to confirm appt for 11/10/14.

## 2014-11-05 NOTE — Telephone Encounter (Signed)
Patient said she received Rx's back at November visit for Climara patch and Prometrium.  She said she thought you instructed her to take is Daily Day 1-12 of the month (chart does indicate this).  However, the prescription you wrote says to take it daily and pharmacy telling her that is how you want her to take it.  Please advise.

## 2014-11-10 ENCOUNTER — Other Ambulatory Visit (HOSPITAL_BASED_OUTPATIENT_CLINIC_OR_DEPARTMENT_OTHER): Payer: Federal, State, Local not specified - PPO

## 2014-11-10 DIAGNOSIS — D473 Essential (hemorrhagic) thrombocythemia: Secondary | ICD-10-CM

## 2014-11-11 LAB — IRON AND TIBC CHCC
%SAT: 27 % (ref 21–57)
IRON: 62 ug/dL (ref 41–142)
TIBC: 231 ug/dL — ABNORMAL LOW (ref 236–444)
UIBC: 169 ug/dL (ref 120–384)

## 2014-11-11 LAB — FERRITIN CHCC: Ferritin: 75 ng/ml (ref 9–269)

## 2015-01-06 ENCOUNTER — Encounter: Payer: Federal, State, Local not specified - PPO | Admitting: Gastroenterology

## 2015-02-23 ENCOUNTER — Telehealth: Payer: Self-pay

## 2015-02-23 ENCOUNTER — Encounter: Payer: Self-pay | Admitting: Gastroenterology

## 2015-02-23 NOTE — Telephone Encounter (Signed)
Patient called needing the name of the GI MD she had been scheduled for colonscopy with as she had to cancel. I provided her with Dr. Eugenia Pancoast name and phone number.

## 2015-04-08 ENCOUNTER — Encounter: Payer: Federal, State, Local not specified - PPO | Admitting: Gastroenterology

## 2015-05-19 ENCOUNTER — Ambulatory Visit (AMBULATORY_SURGERY_CENTER): Payer: Self-pay

## 2015-05-19 VITALS — Ht 61.0 in | Wt 183.2 lb

## 2015-05-19 DIAGNOSIS — Z1211 Encounter for screening for malignant neoplasm of colon: Secondary | ICD-10-CM

## 2015-05-19 MED ORDER — MOVIPREP 100 G PO SOLR: ORAL | Status: DC

## 2015-05-19 NOTE — Progress Notes (Signed)
Per pt, no allergies to soy or egg products.Pt is not using  O2 at home.Pt will hold MRC6 (diet pill) for 10 days prior to the procedure.

## 2015-06-02 ENCOUNTER — Encounter: Payer: Self-pay | Admitting: Gastroenterology

## 2015-06-02 ENCOUNTER — Ambulatory Visit (AMBULATORY_SURGERY_CENTER): Payer: Federal, State, Local not specified - PPO | Admitting: Gastroenterology

## 2015-06-02 VITALS — BP 103/73 | HR 70 | Temp 96.7°F | Resp 16 | Ht 61.0 in | Wt 183.0 lb

## 2015-06-02 DIAGNOSIS — Z1211 Encounter for screening for malignant neoplasm of colon: Secondary | ICD-10-CM | POA: Diagnosis present

## 2015-06-02 DIAGNOSIS — K5730 Diverticulosis of large intestine without perforation or abscess without bleeding: Secondary | ICD-10-CM

## 2015-06-02 MED ORDER — SODIUM CHLORIDE 0.9 % IV SOLN: 500.0000 mL | INTRAVENOUS | Status: DC

## 2015-06-02 NOTE — Op Note (Signed)
[facility]  [address] [place], [address]   COLONOSCOPY PROCEDURE REPORT  PATIENT: Carol Franklin, Carol Franklin  MR#: [id number] BIRTHDATE: 05-14-1961 , 54  yrs. old GENDER: female ENDOSCOPIST: Milus Banister, MD PROCEDURE DATE:  06/02/2015 PROCEDURE:   Colonoscopy, screening First Screening Colonoscopy - Avg.  risk and is 50 yrs.  old or older Yes.  Prior Negative Screening - Now for repeat screening. N/A  History of Adenoma - Now for follow-up colonoscopy & has been > or = to 3 yrs.  N/A  Recommend repeat exam, <10 yrs? No ASA CLASS:   Class II INDICATIONS:Screening for colonic neoplasia and Colorectal Neoplasm Risk Assessment for this procedure is average risk. MEDICATIONS: Monitored anesthesia care and Propofol 200 mg IV  DESCRIPTION OF PROCEDURE:   After the risks benefits and alternatives of the procedure were thoroughly explained, informed consent was obtained.  The digital rectal exam revealed no abnormalities of the rectum.   The [id number]  endoscope was introduced through the anus and advanced to the cecum, which was identified by both the appendix and ileocecal valve. No adverse events experienced.   The quality of the prep was excellent.  The instrument was then slowly withdrawn as the colon was fully examined. Estimated blood loss is zero unless otherwise noted in this procedure report.   COLON FINDINGS: There was mild diverticulosis noted in the left colon.   The examination was otherwise normal.  Retroflexed views revealed no abnormalities. The time to cecum = 1.8 Withdrawal time = 6.9   The scope was withdrawn and the procedure completed. COMPLICATIONS: There were no immediate complications.  ENDOSCOPIC IMPRESSION: 1.   Mild diverticulosis was noted in the left colon 2.   The examination was otherwise normal; no polyps or cancers  RECOMMENDATIONS: You should continue to follow colorectal cancer screening guidelines for "routine risk"  patients with a repeat colonoscopy in 10 years.   eSigned:  Milus Banister, MD 06/02/2015 8:21 AM

## 2015-06-02 NOTE — Progress Notes (Signed)
A/ox3 pleased with MAC, report to Tracy RN 

## 2015-06-02 NOTE — Patient Instructions (Signed)
Impressions/recommendations:  Diverticulosis (handout given) High Fiber Diet (handout given)  Repeat colonoscopy in 10 years.  YOU HAD AN ENDOSCOPIC PROCEDURE TODAY AT [facility]:   Refer to the procedure report that was given to you for any specific questions about what was found during the examination.  If the procedure report does not answer your questions, please call your gastroenterologist to clarify.  If you requested that your care partner not be given the details of your procedure findings, then the procedure report has been included in a sealed envelope for you to review at your convenience later.  YOU SHOULD EXPECT: Some feelings of bloating in the abdomen. Passage of more gas than usual.  Walking can help get rid of the air that was put into your GI tract during the procedure and reduce the bloating. If you had a lower endoscopy (such as a colonoscopy or flexible sigmoidoscopy) you may notice spotting of blood in your stool or on the toilet paper. If you underwent a bowel prep for your procedure, you may not have a normal bowel movement for a few days.  Please Note:  You might notice some irritation and congestion in your nose or some drainage.  This is from the oxygen used during your procedure.  There is no need for concern and it should clear up in a day or so.  SYMPTOMS TO REPORT IMMEDIATELY:   Following lower endoscopy (colonoscopy or flexible sigmoidoscopy):  Excessive amounts of blood in the stool  Significant tenderness or worsening of abdominal pains  Swelling of the abdomen that is new, acute  Fever of 100F or higher   For urgent or emergent issues, a gastroenterologist can be reached at any hour by calling [phone].   DIET: Your first meal following the procedure should be a small meal and then it is ok to progress to your normal diet. Heavy or fried foods are harder to digest and may make you feel nauseous or bloated.  Likewise, meals heavy  in dairy and vegetables can increase bloating.  Drink plenty of fluids but you should avoid alcoholic beverages for 24 hours.  ACTIVITY:  You should plan to take it easy for the rest of today and you should NOT DRIVE or use heavy machinery until tomorrow (because of the sedation medicines used during the test).    FOLLOW UP: Our staff will call the number listed on your records the next business day following your procedure to check on you and address any questions or concerns that you may have regarding the information given to you following your procedure. If we do not reach you, we will leave a message.  However, if you are feeling well and you are not experiencing any problems, there is no need to return our call.  We will assume that you have returned to your regular daily activities without incident.  If any biopsies were taken you will be contacted by phone or by letter within the next 1-3 weeks.  Please call us at [phone] if you have not heard about the biopsies in 3 weeks.    SIGNATURES/CONFIDENTIALITY: You and/or your care partner have signed paperwork which will be entered into your electronic medical record.  These signatures attest to the fact that that the information above on your After Visit Summary has been reviewed and is understood.  Full responsibility of the confidentiality of this discharge information lies with you and/or your care-partner.

## 2015-06-03 ENCOUNTER — Telehealth: Payer: Self-pay | Admitting: *Deleted

## 2015-06-03 NOTE — Telephone Encounter (Signed)
  Follow up Call-  Call back number 06/02/2015  Post procedure Call Back phone  # [phone]  Permission to leave phone message Yes   [facility]

## 2015-08-17 ENCOUNTER — Other Ambulatory Visit: Payer: Self-pay

## 2015-08-17 DIAGNOSIS — Z1231 Encounter for screening mammogram for malignant neoplasm of breast: Secondary | ICD-10-CM

## 2015-08-28 ENCOUNTER — Ambulatory Visit: Payer: Federal, State, Local not specified - PPO

## 2015-09-08 ENCOUNTER — Ambulatory Visit: Payer: Federal, State, Local not specified - PPO

## 2015-09-29 ENCOUNTER — Other Ambulatory Visit: Payer: Self-pay | Admitting: *Deleted

## 2015-09-29 MED ORDER — ESTRADIOL 0.1 MG/24HR TD PTWK: 0.1000 mg | MEDICATED_PATCH | TRANSDERMAL | Status: DC

## 2015-09-29 NOTE — Telephone Encounter (Signed)
Pt annual scheduled on 10/28/15, needs refill on climara patch. Rx sent

## 2015-10-28 ENCOUNTER — Ambulatory Visit: Payer: Federal, State, Local not specified - PPO

## 2015-10-28 ENCOUNTER — Encounter: Payer: Federal, State, Local not specified - PPO | Admitting: Gynecology

## 2015-11-03 ENCOUNTER — Ambulatory Visit
Admission: RE | Admit: 2015-11-03 | Discharge: 2015-11-03 | Disposition: A | Discharge status: Home / Self Care | Payer: Federal, State, Local not specified - PPO | Source: Ambulatory Visit

## 2015-11-03 DIAGNOSIS — Z1231 Encounter for screening mammogram for malignant neoplasm of breast: Secondary | ICD-10-CM

## 2015-11-04 ENCOUNTER — Other Ambulatory Visit: Payer: Self-pay | Admitting: Gynecology

## 2015-11-04 ENCOUNTER — Other Ambulatory Visit: Payer: Self-pay | Admitting: *Deleted

## 2015-11-04 MED ORDER — ESTRADIOL 0.1 MG/24HR TD PTWK: 0.1000 mg | MEDICATED_PATCH | TRANSDERMAL | Status: DC

## 2015-11-04 NOTE — Telephone Encounter (Signed)
Pt called requesting refill on estradiol patch 0.1 mg, annual scheduled on 12/03/13, Rx sent.

## 2015-11-30 ENCOUNTER — Other Ambulatory Visit: Payer: Self-pay | Admitting: Gynecology

## 2015-12-04 ENCOUNTER — Ambulatory Visit (INDEPENDENT_AMBULATORY_CARE_PROVIDER_SITE_OTHER): Payer: Federal, State, Local not specified - PPO | Admitting: Gynecology

## 2015-12-04 ENCOUNTER — Encounter: Payer: Self-pay | Admitting: Gynecology

## 2015-12-04 VITALS — BP 126/78 | Ht 60.0 in | Wt 190.0 lb

## 2015-12-04 DIAGNOSIS — M8580 Other specified disorders of bone density and structure, unspecified site: Secondary | ICD-10-CM | POA: Diagnosis not present

## 2015-12-04 DIAGNOSIS — Z79890 Hormone replacement therapy (postmenopausal): Secondary | ICD-10-CM | POA: Diagnosis not present

## 2015-12-04 DIAGNOSIS — Z01419 Encounter for gynecological examination (general) (routine) without abnormal findings: Secondary | ICD-10-CM

## 2015-12-04 HISTORY — DX: Other specified disorders of bone density and structure, unspecified site: M85.80

## 2015-12-04 LAB — COMPREHENSIVE METABOLIC PANEL
ALBUMIN: 3.9 g/dL (ref 3.6–5.1)
ALK PHOS: 65 U/L (ref 33–130)
ALT: 10 U/L (ref 6–29)
AST: 15 U/L (ref 10–35)
BUN: 10 mg/dL (ref 7–25)
CHLORIDE: 101 mmol/L (ref 98–110)
CO2: 24 mmol/L (ref 20–31)
Calcium: 8.7 mg/dL (ref 8.6–10.4)
Creat: 0.62 mg/dL (ref 0.50–1.05)
Glucose, Bld: 87 mg/dL (ref 65–99)
POTASSIUM: 4.2 mmol/L (ref 3.5–5.3)
Sodium: 140 mmol/L (ref 135–146)
TOTAL PROTEIN: 7.2 g/dL (ref 6.1–8.1)
Total Bilirubin: 0.4 mg/dL (ref 0.2–1.2)

## 2015-12-04 LAB — CBC WITH DIFFERENTIAL/PLATELET
BASOS ABS: 0 10*3/uL (ref 0.0–0.1)
Basophils Relative: 0 % (ref 0–1)
Eosinophils Absolute: 0.1 10*3/uL (ref 0.0–0.7)
Eosinophils Relative: 1 % (ref 0–5)
HEMATOCRIT: 41.3 % (ref 36.0–46.0)
HEMOGLOBIN: 13.4 g/dL (ref 12.0–15.0)
LYMPHS ABS: 3.9 10*3/uL (ref 0.7–4.0)
LYMPHS PCT: 42 % (ref 12–46)
MCH: 29.6 pg (ref 26.0–34.0)
MCHC: 32.4 g/dL (ref 30.0–36.0)
MCV: 91.4 fL (ref 78.0–100.0)
MONOS PCT: 7 % (ref 3–12)
MPV: 9.5 fL (ref 8.6–12.4)
Monocytes Absolute: 0.6 10*3/uL (ref 0.1–1.0)
NEUTROS ABS: 4.6 10*3/uL (ref 1.7–7.7)
NEUTROS PCT: 50 % (ref 43–77)
PLATELETS: 557 10*3/uL — AB (ref 150–400)
RBC: 4.52 MIL/uL (ref 3.87–5.11)
RDW: 13.8 % (ref 11.5–15.5)
WBC: 9.2 10*3/uL (ref 4.0–10.5)

## 2015-12-04 LAB — URINALYSIS W MICROSCOPIC + REFLEX CULTURE
Bacteria, UA: NONE SEEN [HPF]
Bilirubin Urine: NEGATIVE
CASTS: NONE SEEN [LPF]
Crystals: NONE SEEN [HPF]
GLUCOSE, UA: NEGATIVE
HGB URINE DIPSTICK: NEGATIVE
Ketones, ur: NEGATIVE
LEUKOCYTES UA: NEGATIVE
NITRITE: NEGATIVE
PH: 7.5 (ref 5.0–8.0)
Protein, ur: NEGATIVE
Specific Gravity, Urine: 1.013 (ref 1.001–1.035)
WBC, UA: NONE SEEN WBC/HPF (ref <=5)
YEAST: NONE SEEN [HPF]

## 2015-12-04 LAB — LIPID PANEL
CHOL/HDL RATIO: 2.3 ratio (ref <=5.0)
CHOLESTEROL: 177 mg/dL (ref 125–200)
HDL: 77 mg/dL (ref >=46)
LDL Cholesterol: 86 mg/dL (ref <130)
TRIGLYCERIDES: 68 mg/dL (ref <150)
VLDL: 14 mg/dL (ref <30)

## 2015-12-04 LAB — TSH: TSH: 1.845 u[IU]/mL (ref 0.350–4.500)

## 2015-12-04 MED ORDER — ESTRADIOL 0.5 MG PO TABS: 0.5000 mg | ORAL_TABLET | Freq: Every day | ORAL | Status: DC

## 2015-12-04 MED ORDER — PROGESTERONE MICRONIZED 200 MG PO CAPS: ORAL_CAPSULE | ORAL | Status: DC

## 2015-12-04 NOTE — Patient Instructions (Addendum)
Hormone Therapy At menopause, your body begins making less estrogen and progesterone hormones. This causes the body to stop having menstrual periods. This is because estrogen and progesterone hormones control your periods and menstrual cycle. A lack of estrogen may cause symptoms such as:  Hot flushes (or hot flashes).  Vaginal dryness.  Dry skin.  Loss of sex drive.  Risk of bone loss (osteoporosis). When this happens, you may choose to take hormone therapy to get back the estrogen lost during menopause. When the hormone estrogen is given alone, it is usually referred to as ET (Estrogen Therapy). When the hormone progestin is combined with estrogen, it is generally called HT (Hormone Therapy). This was formerly known as hormone replacement therapy (HRT). Your caregiver can help you make a decision on what will be best for you. The decision to use HT seems to change often as new studies are done. Many studies do not agree on the benefits of hormone replacement therapy. LIKELY BENEFITS OF HT INCLUDE PROTECTION FROM:  Hot Flushes (also called hot flashes) - A hot flush is a sudden feeling of heat that spreads over the face and body. The skin may redden like a blush. It is connected with sweats and sleep disturbance. Women going through menopause may have hot flushes a few times a month or several times per day depending on the woman.  Osteoporosis (bone loss) - Estrogen helps guard against bone loss. After menopause, a woman's bones slowly lose calcium and become weak and brittle. As a result, bones are more likely to break. The hip, wrist, and spine are affected most often. Hormone therapy can help slow bone loss after menopause. Weight bearing exercise and taking calcium with vitamin D also can help prevent bone loss. There are also medications that your caregiver can prescribe that can help prevent osteoporosis.  Vaginal dryness - Loss of estrogen causes changes in the vagina. Its lining may  become thin and dry. These changes can cause pain and bleeding during sexual intercourse. Dryness can also lead to infections. This can cause burning and itching. (Vaginal estrogen treatment can help relieve pain, itching, and dryness.)  Urinary tract infections are more common after menopause because of lack of estrogen. Some women also develop urinary incontinence because of low estrogen levels in the vagina and bladder.  Possible other benefits of estrogen include a positive effect on mood and short-term memory in women. RISKS AND COMPLICATIONS  Using estrogen alone without progesterone causes the lining of the uterus to grow. This increases the risk of lining of the uterus (endometrial) cancer. Your caregiver should give another hormone called progestin if you have a uterus.  Women who take combined (estrogen and progestin) HT appear to have an increased risk of breast cancer. The risk appears to be small, but increases throughout the time that HT is taken.  Combined therapy also makes the breast tissue slightly denser which makes it harder to read mammograms (breast X-rays).  Combined, estrogen and progesterone therapy can be taken together every day, in which case there may be spotting of blood. HT therapy can be taken cyclically in which case you will have menstrual periods. Cyclically means HT is taken for a set amount of days, then not taken, then this process is repeated.  HT may increase the risk of stroke, heart attack, breast cancer and forming blood clots in your leg.  Transdermal estrogen (estrogen that is absorbed through the skin with a patch or a cream) may have better results with:  Cholesterol.  Blood pressure.  Blood clots. Having the following conditions may indicate you should not have HT:  Endometrial cancer.  Liver disease.  Breast cancer.  Heart disease.  History of blood clots.  Stroke. TREATMENT   If you choose to take HT and have a uterus, usually  estrogen and progestin are prescribed.  Your caregiver will help you decide the best way to take the medications.  Possible ways to take estrogen include: 1. Pills. 2. Patches. 3. Gels. 4. Sprays. 5. Vaginal estrogen cream, rings and tablets.  It is best to take the lowest dose possible that will help your symptoms and take them for the shortest period of time that you can.  Hormone therapy can help relieve some of the problems (symptoms) that affect women at menopause. Before making a decision about HT, talk to your caregiver about what is best for you. Be well informed and comfortable with your decisions. HOME CARE INSTRUCTIONS   Follow your caregivers advice when taking the medications.  A Pap test is done to screen for cervical cancer. 1. The first Pap test should be done at age 74. 35. Between ages 33 and 56, Pap tests are repeated every 2 years. 3. Beginning at age 26, you are advised to have a Pap test every 3 years as long as the past 3 Pap tests have been normal. 4. Some women have medical problems that increase the chance of getting cervical cancer. Talk to your caregiver about these problems. It is especially important to talk to your caregiver if a new problem develops soon after your last Pap test. In these cases, your caregiver may recommend more frequent screening and Pap tests. 5. The above recommendations are the same for women who have or have not gotten the vaccine for HPV (human papillomavirus). 6. If you had a hysterectomy for a problem that was not a cancer or a condition that could lead to cancer, then you no longer need Pap tests. However, even if you no longer need a Pap test, a regular exam is a good idea to make sure no other problems are starting. 7. If you are between ages 52 and 20, and you have had normal Pap tests going back 10 years, you no longer need Pap tests. However, even if you no longer need a Pap test, a regular exam is a good idea to make sure no  other problems are starting. 8. If you have had past treatment for cervical cancer or a condition that could lead to cancer, you need Pap tests and screening for cancer for at least 20 years after your treatment. 9. If Pap tests have been discontinued, risk factors (such as a new sexual partner)need to be re-assessed to determine if screening should be resumed.  Some women may need screenings more often if they are at high risk for cervical cancer.  Get mammograms done as per the advice of your caregiver. SEEK IMMEDIATE MEDICAL CARE IF:  You develop abnormal vaginal bleeding.  You have pain or swelling in your legs, shortness of breath, or chest pain.  You develop dizziness or headaches.  You have lumps or changes in your breasts or armpits.  You have slurred speech.  You develop weakness or numbness of your arms or legs.  You have pain, burning, or bleeding when urinating.  You develop abdominal pain.   This information is not intended to replace advice given to you by your health care provider. Make sure you discuss any questions  you have with your health care provider.   Document Released: 07/23/2003 Document Revised: 03/10/2015 Document Reviewed: 04/27/2015 Elsevier Interactive Patient Education 2016 [employer]. Exercise to Lose Weight Exercise and a healthy diet may help you lose weight. Your doctor may suggest specific exercises. EXERCISE IDEAS AND TIPS  Choose low-cost things you enjoy doing, such as walking, bicycling, or exercising to workout videos.   Take stairs instead of the elevator.   Walk during your lunch break.   Park your car further away from work or school.   Go to a gym or an exercise class.   Start with 5 to 10 minutes of exercise each day. Build up to 30 minutes of exercise 4 to 6 days a week.   Wear shoes with good support and comfortable clothes.   Stretch before and after working out.   Work out until you breathe harder and your heart  beats faster.   Drink extra water when you exercise.   Do not do so much that you hurt yourself, feel dizzy, or get very short of breath.  Exercises that burn about 150 calories:  Running 1  miles in 15 minutes.   Playing volleyball for 45 to 60 minutes.   Washing and waxing a car for 45 to 60 minutes.   Playing touch football for 45 minutes.   Walking 1  miles in 35 minutes.   Pushing a stroller 1  miles in 30 minutes.   Playing basketball for 30 minutes.   Raking leaves for 30 minutes.   Bicycling 5 miles in 30 minutes.   Walking 2 miles in 30 minutes.   Dancing for 30 minutes.   Shoveling snow for 15 minutes.   Swimming laps for 20 minutes.   Walking up stairs for 15 minutes.   Bicycling 4 miles in 15 minutes.   Gardening for 30 to 45 minutes.   Jumping rope for 15 minutes.   Washing windows or floors for 45 to 60 minutes.  Document Released: 11/26/2010 Document Revised: 07/06/2011 Document Reviewed: 11/26/2010 [facility] Patient Information 2012 [place].                                          Patient information: High cholesterol (The Basics)  What is cholesterol? - Cholesterol is a substance that is found in the blood. Everyone has some. It is needed for good health. The problem is, people sometimes have too much cholesterol. Compared with people with normal cholesterol, people with high cholesterol have a higher risk of heart attacks, strokes, and other health problems. The higher your cholesterol, the higher your risk of these problems. Cholesterol levels in your body are determined significantly by your diet. Cholesterol levels may also be related to heart disease. The following material helps to explain this relationship and discusses what you can do to help keep your heart healthy. Not all cholesterol is bad. Low-density lipoprotein (LDL) cholesterol is the "bad" cholesterol. It may cause fatty deposits to build up inside your arteries. High-density  lipoprotein (HDL) cholesterol is "good." It helps to remove the "bad" LDL cholesterol from your blood. Cholesterol is a very important risk factor for heart disease. Other risk factors are high blood pressure, smoking, stress, heredity, and weight.  The heart muscle gets its supply of blood through the coronary arteries. If your LDL cholesterol is high and your HDL cholesterol is low, you  are at risk for having fatty deposits build up in your coronary arteries. This leaves less room through which blood can flow. Without sufficient blood and oxygen, the heart muscle cannot function properly and you may feel chest pains (angina pectoris). When a coronary artery closes up entirely, a part of the heart muscle may die, causing a heart attack (myocardial infarction).  CHECKING CHOLESTEROL When your caregiver sends your blood to a lab to be analyzed for cholesterol, a complete lipid (fat) profile may be done. With this test, the total amount of cholesterol and levels of LDL and HDL are determined. Triglycerides are a type of fat that circulates in the blood and can also be used to determine heart disease risk. Are there different types of cholesterol? - Yes, there are a few different types. If you get a cholesterol test, you may hear your doctor or nurse talk about: Total cholesterol  LDL cholesterol - Some people call this the "bad" cholesterol. That's because having high LDL levels raises your risk of heart attacks, strokes, and other health problems.  HDL cholesterol - Some people call this the "good" cholesterol. That's because having high HDL levels lowers your risk of heart attacks, strokes, and other health problems.  Non-HDL cholesterol - Non-HDL cholesterol is your total cholesterol minus your HDL cholesterol.  Triglycerides - Triglycerides are not cholesterol. They are a type of fat. But they often get measured when cholesterol is measured. (Having high triglycerides also seems to increase the risk of  heart attacks and strokes.)   Keep in mind, though, that many people who cannot meet these goals still have a low risk of heart attacks and strokes. What should I do if my doctor tells me I have high cholesterol? - Ask your doctor what your overall risk of heart attacks and strokes is. High cholesterol, by itself, is not always a reason to worry. Having high cholesterol is just one of many things that can increase your risk of heart attacks and strokes. Other factors that increase your risk include:  Cigarette smoking  High blood pressure  Having a parent, sister, or brother who got heart disease at a young age ([place], in this case, means younger than 24 for men and younger than 28 for women.)  Being a man (Women are at risk, too, but men have a higher risk.)  Older age  If you are at high risk of heart attacks and strokes, having high cholesterol is a problem. On the other hand, if you have are at low risk, having high cholesterol may not mean much. Should I take medicine to lower cholesterol? - Not everyone who has high cholesterol needs medicines. Your doctor or nurse will decide if you need them based on your age, family history, and other health concerns.  You should probably take a cholesterol-lowering medicine called a statin if you: Already had a heart attack or stroke  Have known heart disease  Have diabetes  Have a condition called peripheral artery disease, which makes it painful to walk, and happens when the arteries in your legs get clogged with fatty deposits  Have an abdominal aortic aneurysm, which is a widening of the main artery in the belly  Most people with any of the conditions listed above should take a statin no matter what their cholesterol level is. If your doctor or nurse puts you on a statin, stay on it. The medicine may not make you feel any different. But it can help prevent heart attacks,  strokes, and death.  Can I lower my cholesterol without medicines? - Yes, you can  lower your cholesterol some by:  Avoiding red meat, butter, fried foods, cheese, and other foods that have a lot of saturated fat  Losing weight (if you are overweight)  Being more active Even if these steps do little to change your cholesterol, they can improve your health in many ways.                                                   Cholesterol Control Diet  CONTROLLING CHOLESTEROL WITH DIET Although exercise and lifestyle factors are important, your diet is key. That is because certain foods are known to raise cholesterol and others to lower it. The goal is to balance foods for their effect on cholesterol and more importantly, to replace saturated and trans fat with other types of fat, such as monounsaturated fat, polyunsaturated fat, and omega-3 fatty acids. On average, a person should consume no more than 15 to 17 g of saturated fat daily. Saturated and trans fats are considered "bad" fats, and they will raise LDL cholesterol. Saturated fats are primarily found in animal products such as meats, butter, and cream. However, that does not mean you need to sacrifice all your favorite foods. Today, there are good tasting, low-fat, low-cholesterol substitutes for most of the things you like to eat. Choose low-fat or nonfat alternatives. Choose round or loin cuts of red meat, since these types of cuts are lowest in fat and cholesterol. Chicken (without the skin), fish, veal, and ground [place] breast are excellent choices. Eliminate fatty meats, such as hot dogs and salami. Even shellfish have little or no saturated fat. Have a 3 oz (85 g) portion when you eat lean meat, poultry, or fish. Trans fats are also called "partially hydrogenated oils." They are oils that have been scientifically manipulated so that they are solid at room temperature resulting in a longer shelf life and improved taste and texture of foods in which they are added. Trans fats are found in stick margarine, some tub margarines,  cookies, crackers, and baked goods.  When baking and cooking, oils are an excellent substitute for butter. The monounsaturated oils are especially beneficial since it is believed they lower LDL and raise HDL. The oils you should avoid entirely are saturated tropical oils, such as coconut and palm.  Remember to eat liberally from food groups that are naturally free of saturated and trans fat, including fish, fruit, vegetables, beans, grains (barley, rice, couscous, bulgur wheat), and pasta (without cream sauces).  IDENTIFYING FOODS THAT LOWER CHOLESTEROL  Soluble fiber may lower your cholesterol. This type of fiber is found in fruits such as apples, vegetables such as broccoli, potatoes, and carrots, legumes such as beans, peas, and lentils, and grains such as barley. Foods fortified with plant sterols (phytosterol) may also lower cholesterol. You should eat at least 2 g per day of these foods for a cholesterol lowering effect.  Read package labels to identify low-saturated fats, trans fats free, and low-fat foods at the supermarket. Select cheeses that have only 2 to 3 g saturated fat per ounce. Use a heart-healthy tub margarine that is free of trans fats or partially hydrogenated oil. When buying baked goods (cookies, crackers), avoid partially hydrogenated oils. Breads and muffins should be made from whole grains (whole-wheat  or whole oat flour, instead of "flour" or "enriched flour"). Buy non-creamy canned soups with reduced salt and no added fats.  FOOD PREPARATION TECHNIQUES  Never deep-fry. If you must fry, either stir-fry, which uses very little fat, or use non-stick cooking sprays. When possible, broil, bake, or roast meats, and steam vegetables. Instead of dressing vegetables with butter or margarine, use lemon and herbs, applesauce and cinnamon (for squash and sweet potatoes), nonfat yogurt, salsa, and low-fat dressings for salads.  LOW-SATURATED FAT / LOW-FAT FOOD SUBSTITUTES Meats / Saturated  Fat (g)  Avoid: Steak, marbled (3 oz/85 g) / 11 g   Choose: Steak, lean (3 oz/85 g) / 4 g   Avoid: Hamburger (3 oz/85 g) / 7 g   Choose: Hamburger, lean (3 oz/85 g) / 5 g   Avoid: Ham (3 oz/85 g) / 6 g   Choose: Ham, lean cut (3 oz/85 g) / 2.4 g   Avoid: Chicken, with skin, dark meat (3 oz/85 g) / 4 g   Choose: Chicken, skin removed, dark meat (3 oz/85 g) / 2 g   Avoid: Chicken, with skin, light meat (3 oz/85 g) / 2.5 g   Choose: Chicken, skin removed, light meat (3 oz/85 g) / 1 g  Dairy / Saturated Fat (g)  Avoid: Whole milk (1 cup) / 5 g   Choose: Low-fat milk, 2% (1 cup) / 3 g   Choose: Low-fat milk, 1% (1 cup) / 1.5 g   Choose: Skim milk (1 cup) / 0.3 g   Avoid: Hard cheese (1 oz/28 g) / 6 g   Choose: Skim milk cheese (1 oz/28 g) / 2 to 3 g   Avoid: Cottage cheese, 4% fat (1 cup) / 6.5 g   Choose: Low-fat cottage cheese, 1% fat (1 cup) / 1.5 g   Avoid: Ice cream (1 cup) / 9 g   Choose: Sherbet (1 cup) / 2.5 g   Choose: Nonfat frozen yogurt (1 cup) / 0.3 g   Choose: Frozen fruit bar / trace   Avoid: Whipped cream (1 tbs) / 3.5 g   Choose: Nondairy whipped topping (1 tbs) / 1 g  Condiments / Saturated Fat (g)  Avoid: Mayonnaise (1 tbs) / 2 g   Choose: Low-fat mayonnaise (1 tbs) / 1 g   Avoid: Butter (1 tbs) / 7 g   Choose: Extra light margarine (1 tbs) / 1 g   Avoid: Coconut oil (1 tbs) / 11.8 g   Choose: Olive oil (1 tbs) / 1.8 g   Choose: Corn oil (1 tbs) / 1.7 g   Choose: Safflower oil (1 tbs) / 1.2 g   Choose: Sunflower oil (1 tbs) / 1.4 g   Choose: Soybean oil (1 tbs) / 2.4 g   Choose: Canola oil (1 tbs) / 1 g

## 2015-12-04 NOTE — Progress Notes (Signed)
Carol Franklin [birth year] Jena:[id number]   History:    55 y.o.  for annual gyn exam with a complaint of the estrogen patch causing her irritation or skin and wanted to change her form of hormone replacement therapy. Review of her record also indicated in the past secondary to hypertension she was on HCTZ 12.5 mg daily and is no longer taking the medication.her blood pressure today was 126/78. Patient had a bone density study in 2015 left femoral neck T score -1.2 negative Frax analysis.  Patient with past history of essential thrombocytosis has had extensive evaluation by Dr. Earlie Server see note in patient's record. Patient had been reminded to take her baby aspirin daily and has not been doing so.  Past medical history,surgical history, family history and social history were all reviewed and documented in the EPIC chart.  Gynecologic History Patient's last menstrual period was 11/17/2015. Contraception: post menopausal status Last Pap: 2015. Results were: normal Last mammogram: December 2016. Results were: normal  Obstetric History OB History  Gravida Para Term Preterm AB SAB TAB Ectopic Multiple Living  4 2 2  2 2    2     # Outcome Date GA Lbr Len/2nd Weight Sex Delivery Anes PTL Lv  4 SAB           3 SAB           2 Term     M CS-Unspec  N Y  1 Term     M CS-Unspec  N Y       ROS: A ROS was performed and pertinent positives and negatives are included in the history.  GENERAL: No fevers or chills. HEENT: No change in vision, no earache, sore throat or sinus congestion. NECK: No pain or stiffness. CARDIOVASCULAR: No chest pain or pressure. No palpitations. PULMONARY: No shortness of breath, cough or wheeze. GASTROINTESTINAL: No abdominal pain, nausea, vomiting or diarrhea, melena or bright red blood per rectum. GENITOURINARY: No urinary frequency, urgency, hesitancy or dysuria. MUSCULOSKELETAL: No joint or muscle pain, no back pain, no recent trauma. DERMATOLOGIC: No rash, no itching, no  lesions. ENDOCRINE: No polyuria, polydipsia, no heat or cold intolerance. No recent change in weight. HEMATOLOGICAL: No anemia or easy bruising or bleeding. NEUROLOGIC: No headache, seizures, numbness, tingling or weakness. PSYCHIATRIC: No depression, no loss of interest in normal activity or change in sleep pattern.     Exam: chaperone present  BP 126/78 mmHg  Ht 5' (1.524 m)  Wt 190 lb (86.183 kg)  BMI 37.11 kg/m2  LMP 11/17/2015  Body mass index is 37.11 kg/(m^2).  General appearance : Well developed well nourished female. No acute distress HEENT: Eyes: no retinal hemorrhage or exudates,  Neck supple, trachea midline, no carotid bruits, no thyroidmegaly Lungs: Clear to auscultation, no rhonchi or wheezes, or rib retractions  Heart: Regular rate and rhythm, no murmurs or gallops Breast:Examined in sitting and supine position were symmetrical in appearance, no palpable masses or tenderness,  no skin retraction, no nipple inversion, no nipple discharge, no skin discoloration, no axillary or supraclavicular lymphadenopathy Abdomen: no palpable masses or tenderness, no rebound or guarding Extremities: no edema or skin discoloration or tenderness  Pelvic:  Bartholin, Urethra, Skene Glands: Within normal limits             Vagina: No gross lesions or discharge  Cervix: No gross lesions or discharge  Uterus  anteverted, normal size, shape and consistency, non-tender and mobile  Adnexa  Without masses or tenderness  Anus and perineum  normal   Rectovaginal  normal sphincter tone without palpated masses or tenderness             Hemoccult colonoscopy less than 12 months     Assessment/Plan:  55 y.o. female for annual exam with history of essential thrombocytosis extensive evaluation by hematologist Dr. Earlie Server. Patient was reminded again to adhere to his recommendations to take one baby aspirin daily. The following screening blood work was ordered: Comprehensive metabolic panel,CBC,  fasting lipid profile, TSH, and urinalysis. We are going to discontinue her [place] transdermal patch and put on a low dose continuous estrogen such as Estrace 0.5 mg daily with the addition of Prometrium 200 mg for 12 days of the month. We discussed potential risks such as DVT and pulmonary embolism. And literature information was provided. We discussed women's health initiative study. Patient declined flu vaccine today.we discussed also importance of calcium vitamin D and weightbearing exercises for osteoporosis prevention.   Terrance Mass MD, 12:14 PM 12/04/2015

## 2015-12-06 LAB — URINE CULTURE

## 2015-12-07 ENCOUNTER — Other Ambulatory Visit: Payer: Self-pay | Admitting: Gynecology

## 2015-12-07 MED ORDER — CEFUROXIME AXETIL 250 MG PO TABS: 250.0000 mg | ORAL_TABLET | Freq: Two times a day (BID) | ORAL | Status: DC

## 2016-02-17 DIAGNOSIS — M25561 Pain in right knee: Secondary | ICD-10-CM | POA: Diagnosis not present

## 2016-02-17 DIAGNOSIS — M545 Low back pain: Secondary | ICD-10-CM | POA: Diagnosis not present

## 2016-03-04 DIAGNOSIS — M5441 Lumbago with sciatica, right side: Secondary | ICD-10-CM | POA: Diagnosis not present

## 2016-03-04 DIAGNOSIS — M9902 Segmental and somatic dysfunction of thoracic region: Secondary | ICD-10-CM | POA: Diagnosis not present

## 2016-03-04 DIAGNOSIS — M25551 Pain in right hip: Secondary | ICD-10-CM | POA: Diagnosis not present

## 2016-03-04 DIAGNOSIS — M9903 Segmental and somatic dysfunction of lumbar region: Secondary | ICD-10-CM | POA: Diagnosis not present

## 2016-03-04 DIAGNOSIS — M461 Sacroiliitis, not elsewhere classified: Secondary | ICD-10-CM | POA: Diagnosis not present

## 2016-03-04 DIAGNOSIS — M9905 Segmental and somatic dysfunction of pelvic region: Secondary | ICD-10-CM | POA: Diagnosis not present

## 2016-03-04 DIAGNOSIS — M9904 Segmental and somatic dysfunction of sacral region: Secondary | ICD-10-CM | POA: Diagnosis not present

## 2016-03-07 DIAGNOSIS — M9903 Segmental and somatic dysfunction of lumbar region: Secondary | ICD-10-CM | POA: Diagnosis not present

## 2016-03-07 DIAGNOSIS — M25551 Pain in right hip: Secondary | ICD-10-CM | POA: Diagnosis not present

## 2016-03-07 DIAGNOSIS — M5441 Lumbago with sciatica, right side: Secondary | ICD-10-CM | POA: Diagnosis not present

## 2016-03-07 DIAGNOSIS — M9905 Segmental and somatic dysfunction of pelvic region: Secondary | ICD-10-CM | POA: Diagnosis not present

## 2016-03-11 DIAGNOSIS — M9903 Segmental and somatic dysfunction of lumbar region: Secondary | ICD-10-CM | POA: Diagnosis not present

## 2016-03-11 DIAGNOSIS — M5441 Lumbago with sciatica, right side: Secondary | ICD-10-CM | POA: Diagnosis not present

## 2016-03-11 DIAGNOSIS — M25551 Pain in right hip: Secondary | ICD-10-CM | POA: Diagnosis not present

## 2016-03-11 DIAGNOSIS — M9902 Segmental and somatic dysfunction of thoracic region: Secondary | ICD-10-CM | POA: Diagnosis not present

## 2016-03-11 DIAGNOSIS — M461 Sacroiliitis, not elsewhere classified: Secondary | ICD-10-CM | POA: Diagnosis not present

## 2016-03-11 DIAGNOSIS — M9904 Segmental and somatic dysfunction of sacral region: Secondary | ICD-10-CM | POA: Diagnosis not present

## 2016-03-11 DIAGNOSIS — M9905 Segmental and somatic dysfunction of pelvic region: Secondary | ICD-10-CM | POA: Diagnosis not present

## 2016-03-14 DIAGNOSIS — M9902 Segmental and somatic dysfunction of thoracic region: Secondary | ICD-10-CM | POA: Diagnosis not present

## 2016-03-14 DIAGNOSIS — M9905 Segmental and somatic dysfunction of pelvic region: Secondary | ICD-10-CM | POA: Diagnosis not present

## 2016-03-14 DIAGNOSIS — M5441 Lumbago with sciatica, right side: Secondary | ICD-10-CM | POA: Diagnosis not present

## 2016-03-14 DIAGNOSIS — M9904 Segmental and somatic dysfunction of sacral region: Secondary | ICD-10-CM | POA: Diagnosis not present

## 2016-03-14 DIAGNOSIS — M461 Sacroiliitis, not elsewhere classified: Secondary | ICD-10-CM | POA: Diagnosis not present

## 2016-03-14 DIAGNOSIS — M25551 Pain in right hip: Secondary | ICD-10-CM | POA: Diagnosis not present

## 2016-03-14 DIAGNOSIS — M9903 Segmental and somatic dysfunction of lumbar region: Secondary | ICD-10-CM | POA: Diagnosis not present

## 2016-03-18 DIAGNOSIS — M9905 Segmental and somatic dysfunction of pelvic region: Secondary | ICD-10-CM | POA: Diagnosis not present

## 2016-03-18 DIAGNOSIS — M461 Sacroiliitis, not elsewhere classified: Secondary | ICD-10-CM | POA: Diagnosis not present

## 2016-03-18 DIAGNOSIS — M9902 Segmental and somatic dysfunction of thoracic region: Secondary | ICD-10-CM | POA: Diagnosis not present

## 2016-03-18 DIAGNOSIS — M25551 Pain in right hip: Secondary | ICD-10-CM | POA: Diagnosis not present

## 2016-03-18 DIAGNOSIS — M9904 Segmental and somatic dysfunction of sacral region: Secondary | ICD-10-CM | POA: Diagnosis not present

## 2016-03-18 DIAGNOSIS — M5441 Lumbago with sciatica, right side: Secondary | ICD-10-CM | POA: Diagnosis not present

## 2016-03-18 DIAGNOSIS — M9903 Segmental and somatic dysfunction of lumbar region: Secondary | ICD-10-CM | POA: Diagnosis not present

## 2016-03-26 DIAGNOSIS — M25551 Pain in right hip: Secondary | ICD-10-CM | POA: Diagnosis not present

## 2016-03-26 DIAGNOSIS — M9903 Segmental and somatic dysfunction of lumbar region: Secondary | ICD-10-CM | POA: Diagnosis not present

## 2016-03-26 DIAGNOSIS — M5441 Lumbago with sciatica, right side: Secondary | ICD-10-CM | POA: Diagnosis not present

## 2016-03-26 DIAGNOSIS — M9905 Segmental and somatic dysfunction of pelvic region: Secondary | ICD-10-CM | POA: Diagnosis not present

## 2016-03-26 DIAGNOSIS — M9902 Segmental and somatic dysfunction of thoracic region: Secondary | ICD-10-CM | POA: Diagnosis not present

## 2016-03-26 DIAGNOSIS — M461 Sacroiliitis, not elsewhere classified: Secondary | ICD-10-CM | POA: Diagnosis not present

## 2016-03-26 DIAGNOSIS — M9904 Segmental and somatic dysfunction of sacral region: Secondary | ICD-10-CM | POA: Diagnosis not present

## 2016-04-20 DIAGNOSIS — M25551 Pain in right hip: Secondary | ICD-10-CM | POA: Diagnosis not present

## 2016-04-20 DIAGNOSIS — M9903 Segmental and somatic dysfunction of lumbar region: Secondary | ICD-10-CM | POA: Diagnosis not present

## 2016-04-20 DIAGNOSIS — M5441 Lumbago with sciatica, right side: Secondary | ICD-10-CM | POA: Diagnosis not present

## 2016-04-20 DIAGNOSIS — M9905 Segmental and somatic dysfunction of pelvic region: Secondary | ICD-10-CM | POA: Diagnosis not present

## 2016-04-22 DIAGNOSIS — M9902 Segmental and somatic dysfunction of thoracic region: Secondary | ICD-10-CM | POA: Diagnosis not present

## 2016-04-22 DIAGNOSIS — M461 Sacroiliitis, not elsewhere classified: Secondary | ICD-10-CM | POA: Diagnosis not present

## 2016-04-22 DIAGNOSIS — M9904 Segmental and somatic dysfunction of sacral region: Secondary | ICD-10-CM | POA: Diagnosis not present

## 2016-04-22 DIAGNOSIS — M25551 Pain in right hip: Secondary | ICD-10-CM | POA: Diagnosis not present

## 2016-04-22 DIAGNOSIS — M9903 Segmental and somatic dysfunction of lumbar region: Secondary | ICD-10-CM | POA: Diagnosis not present

## 2016-04-22 DIAGNOSIS — M9905 Segmental and somatic dysfunction of pelvic region: Secondary | ICD-10-CM | POA: Diagnosis not present

## 2016-04-22 DIAGNOSIS — M5441 Lumbago with sciatica, right side: Secondary | ICD-10-CM | POA: Diagnosis not present

## 2016-06-04 DIAGNOSIS — M9905 Segmental and somatic dysfunction of pelvic region: Secondary | ICD-10-CM | POA: Diagnosis not present

## 2016-06-04 DIAGNOSIS — M25551 Pain in right hip: Secondary | ICD-10-CM | POA: Diagnosis not present

## 2016-06-04 DIAGNOSIS — M9903 Segmental and somatic dysfunction of lumbar region: Secondary | ICD-10-CM | POA: Diagnosis not present

## 2016-06-04 DIAGNOSIS — M9904 Segmental and somatic dysfunction of sacral region: Secondary | ICD-10-CM | POA: Diagnosis not present

## 2016-06-04 DIAGNOSIS — M9902 Segmental and somatic dysfunction of thoracic region: Secondary | ICD-10-CM | POA: Diagnosis not present

## 2016-06-04 DIAGNOSIS — M5442 Lumbago with sciatica, left side: Secondary | ICD-10-CM | POA: Diagnosis not present

## 2016-06-04 DIAGNOSIS — M461 Sacroiliitis, not elsewhere classified: Secondary | ICD-10-CM | POA: Diagnosis not present

## 2016-09-27 DIAGNOSIS — M5442 Lumbago with sciatica, left side: Secondary | ICD-10-CM | POA: Diagnosis not present

## 2016-09-27 DIAGNOSIS — M9903 Segmental and somatic dysfunction of lumbar region: Secondary | ICD-10-CM | POA: Diagnosis not present

## 2016-09-27 DIAGNOSIS — M9905 Segmental and somatic dysfunction of pelvic region: Secondary | ICD-10-CM | POA: Diagnosis not present

## 2016-09-27 DIAGNOSIS — M25551 Pain in right hip: Secondary | ICD-10-CM | POA: Diagnosis not present

## 2016-09-30 DIAGNOSIS — M9905 Segmental and somatic dysfunction of pelvic region: Secondary | ICD-10-CM | POA: Diagnosis not present

## 2016-09-30 DIAGNOSIS — M25551 Pain in right hip: Secondary | ICD-10-CM | POA: Diagnosis not present

## 2016-09-30 DIAGNOSIS — M5442 Lumbago with sciatica, left side: Secondary | ICD-10-CM | POA: Diagnosis not present

## 2016-09-30 DIAGNOSIS — M9903 Segmental and somatic dysfunction of lumbar region: Secondary | ICD-10-CM | POA: Diagnosis not present

## 2016-11-05 DIAGNOSIS — M25551 Pain in right hip: Secondary | ICD-10-CM | POA: Diagnosis not present

## 2016-11-05 DIAGNOSIS — M9903 Segmental and somatic dysfunction of lumbar region: Secondary | ICD-10-CM | POA: Diagnosis not present

## 2016-11-05 DIAGNOSIS — M9905 Segmental and somatic dysfunction of pelvic region: Secondary | ICD-10-CM | POA: Diagnosis not present

## 2016-11-05 DIAGNOSIS — M5441 Lumbago with sciatica, right side: Secondary | ICD-10-CM | POA: Diagnosis not present

## 2016-11-05 DIAGNOSIS — M9904 Segmental and somatic dysfunction of sacral region: Secondary | ICD-10-CM | POA: Diagnosis not present

## 2016-11-05 DIAGNOSIS — M9902 Segmental and somatic dysfunction of thoracic region: Secondary | ICD-10-CM | POA: Diagnosis not present

## 2016-11-05 DIAGNOSIS — M461 Sacroiliitis, not elsewhere classified: Secondary | ICD-10-CM | POA: Diagnosis not present

## 2016-11-18 DIAGNOSIS — M9904 Segmental and somatic dysfunction of sacral region: Secondary | ICD-10-CM | POA: Diagnosis not present

## 2016-11-18 DIAGNOSIS — M5441 Lumbago with sciatica, right side: Secondary | ICD-10-CM | POA: Diagnosis not present

## 2016-11-18 DIAGNOSIS — M461 Sacroiliitis, not elsewhere classified: Secondary | ICD-10-CM | POA: Diagnosis not present

## 2016-11-18 DIAGNOSIS — M9903 Segmental and somatic dysfunction of lumbar region: Secondary | ICD-10-CM | POA: Diagnosis not present

## 2016-11-18 DIAGNOSIS — M25551 Pain in right hip: Secondary | ICD-10-CM | POA: Diagnosis not present

## 2016-11-18 DIAGNOSIS — M546 Pain in thoracic spine: Secondary | ICD-10-CM | POA: Diagnosis not present

## 2016-11-18 DIAGNOSIS — M9905 Segmental and somatic dysfunction of pelvic region: Secondary | ICD-10-CM | POA: Diagnosis not present

## 2016-11-18 DIAGNOSIS — M9902 Segmental and somatic dysfunction of thoracic region: Secondary | ICD-10-CM | POA: Diagnosis not present

## 2016-11-19 DIAGNOSIS — M5441 Lumbago with sciatica, right side: Secondary | ICD-10-CM | POA: Diagnosis not present

## 2016-11-19 DIAGNOSIS — M9905 Segmental and somatic dysfunction of pelvic region: Secondary | ICD-10-CM | POA: Diagnosis not present

## 2016-11-19 DIAGNOSIS — M461 Sacroiliitis, not elsewhere classified: Secondary | ICD-10-CM | POA: Diagnosis not present

## 2016-11-19 DIAGNOSIS — M546 Pain in thoracic spine: Secondary | ICD-10-CM | POA: Diagnosis not present

## 2016-11-19 DIAGNOSIS — M25551 Pain in right hip: Secondary | ICD-10-CM | POA: Diagnosis not present

## 2016-11-19 DIAGNOSIS — M9903 Segmental and somatic dysfunction of lumbar region: Secondary | ICD-10-CM | POA: Diagnosis not present

## 2016-11-19 DIAGNOSIS — M9904 Segmental and somatic dysfunction of sacral region: Secondary | ICD-10-CM | POA: Diagnosis not present

## 2016-11-19 DIAGNOSIS — M9902 Segmental and somatic dysfunction of thoracic region: Secondary | ICD-10-CM | POA: Diagnosis not present

## 2016-11-21 DIAGNOSIS — M9905 Segmental and somatic dysfunction of pelvic region: Secondary | ICD-10-CM | POA: Diagnosis not present

## 2016-11-21 DIAGNOSIS — M9903 Segmental and somatic dysfunction of lumbar region: Secondary | ICD-10-CM | POA: Diagnosis not present

## 2016-11-21 DIAGNOSIS — M5441 Lumbago with sciatica, right side: Secondary | ICD-10-CM | POA: Diagnosis not present

## 2016-11-21 DIAGNOSIS — M25551 Pain in right hip: Secondary | ICD-10-CM | POA: Diagnosis not present

## 2016-11-25 ENCOUNTER — Other Ambulatory Visit: Payer: Self-pay | Admitting: Gynecology

## 2016-11-25 ENCOUNTER — Ambulatory Visit: Payer: Federal, State, Local not specified - PPO

## 2016-11-25 DIAGNOSIS — Z1231 Encounter for screening mammogram for malignant neoplasm of breast: Secondary | ICD-10-CM

## 2016-11-30 ENCOUNTER — Other Ambulatory Visit: Payer: Self-pay | Admitting: Gynecology

## 2016-12-09 ENCOUNTER — Encounter: Payer: Federal, State, Local not specified - PPO | Admitting: Gynecology

## 2016-12-09 ENCOUNTER — Other Ambulatory Visit: Payer: Self-pay | Admitting: Gynecology

## 2016-12-16 ENCOUNTER — Ambulatory Visit
Admission: RE | Admit: 2016-12-16 | Discharge: 2016-12-16 | Disposition: A | Discharge status: Home / Self Care | Payer: Federal, State, Local not specified - PPO | Source: Ambulatory Visit | Attending: Gynecology | Admitting: Gynecology

## 2016-12-16 DIAGNOSIS — Z1231 Encounter for screening mammogram for malignant neoplasm of breast: Secondary | ICD-10-CM | POA: Diagnosis not present

## 2016-12-22 ENCOUNTER — Encounter: Payer: Federal, State, Local not specified - PPO | Admitting: Gynecology

## 2016-12-23 ENCOUNTER — Ambulatory Visit: Payer: Federal, State, Local not specified - PPO

## 2017-01-06 ENCOUNTER — Other Ambulatory Visit: Payer: Self-pay | Admitting: Gynecology

## 2017-01-13 ENCOUNTER — Encounter: Payer: Self-pay | Admitting: Gynecology

## 2017-01-13 ENCOUNTER — Ambulatory Visit (INDEPENDENT_AMBULATORY_CARE_PROVIDER_SITE_OTHER): Payer: Federal, State, Local not specified - PPO | Admitting: Gynecology

## 2017-01-13 VITALS — BP 126/80 | Ht 60.0 in | Wt 179.0 lb

## 2017-01-13 DIAGNOSIS — E663 Overweight: Secondary | ICD-10-CM

## 2017-01-13 DIAGNOSIS — M8580 Other specified disorders of bone density and structure, unspecified site: Secondary | ICD-10-CM | POA: Diagnosis not present

## 2017-01-13 DIAGNOSIS — Z01411 Encounter for gynecological examination (general) (routine) with abnormal findings: Secondary | ICD-10-CM

## 2017-01-13 DIAGNOSIS — Z79890 Hormone replacement therapy (postmenopausal): Secondary | ICD-10-CM

## 2017-01-13 MED ORDER — ESTRADIOL 0.5 MG PO TABS
0.5000 mg | ORAL_TABLET | Freq: Every day | ORAL | 4 refills | Status: DC
Start: 2017-01-13 — End: 2018-02-15

## 2017-01-13 MED ORDER — PROGESTERONE MICRONIZED 200 MG PO CAPS: 200.0000 mg | ORAL_CAPSULE | Freq: Every day | ORAL | 4 refills | Status: DC

## 2017-01-13 NOTE — Progress Notes (Signed)
Carol Franklin January 13, 1961 [id number]   History:    56 y.o.  for annual gyn exam with no complaints today.patient is doing well on her Estrace 0.5 mg daily with the addition of Prometrium 200 mg for 12 days a month. She reports no vaginal bleeding. She is no longer taking the HCTZ and has been normotensive. Patient is overdue for bone density study last study here in our office in 2015 demonstrated her lowest T score was -1.2 at the left femoral neck with normal Frax analysis. Patient with no past history of any abnormal Pap smear. Her colonoscopy was normal in 2016. Patient declined flu vaccine.  Patient several years ago was diagnosed with essential thrombocytosis and had been evaluated by medical oncologist Dr. Lorna Few and he had evaluated her to include iron study, ferritin, ESR as well as Barnabas Lister 2 mutation. It was all normal and no evidence of any myeloproliferative disorder. Her platelet counts of been ranging between 500 600,000 has recommended she stay on baby aspirin daily.  Past medical history,surgical history, family history and social history were all reviewed and documented in the EPIC chart.  Gynecologic History Patient's last menstrual period was 11/17/2015. Contraception: post menopausal status Last Pap: 2015. Results were: normal Last mammogram: 2018. Results were: normal but dense had three-dimensional mammogram  Obstetric History OB History  Gravida Para Term Preterm AB Living  _0 SAB TAB Ectopic Multiple Live Births  2       2    # Outcome Date GA Lbr Len/2nd Weight Sex Delivery Anes PTL Lv  4 SAB           3 SAB           2 Term     M CS-Unspec  N LIV  1 Term     M CS-Unspec  N LIV       ROS: A ROS was performed and pertinent positives and negatives are included in the history.  GENERAL: No fevers or chills. HEENT: No change in vision, no earache, sore throat or sinus congestion. NECK: No pain or stiffness. CARDIOVASCULAR: No chest pain or  pressure. No palpitations. PULMONARY: No shortness of breath, cough or wheeze. GASTROINTESTINAL: No abdominal pain, nausea, vomiting or diarrhea, melena or bright red blood per rectum. GENITOURINARY: No urinary frequency, urgency, hesitancy or dysuria. MUSCULOSKELETAL: No joint or muscle pain, no back pain, no recent trauma. DERMATOLOGIC: No rash, no itching, no lesions. ENDOCRINE: No polyuria, polydipsia, no heat or cold intolerance. No recent change in weight. HEMATOLOGICAL: No anemia or easy bruising or bleeding. NEUROLOGIC: No headache, seizures, numbness, tingling or weakness. PSYCHIATRIC: No depression, no loss of interest in normal activity or change in sleep pattern.     Exam: chaperone present  BP 126/80   Ht 5' (1.524 m)   Wt 179 lb (81.2 kg)   LMP 11/17/2015   BMI 34.96 kg/m   Body mass index is 34.96 kg/m.  General appearance : Well developed well nourished female. No acute distress HEENT: Eyes: no retinal hemorrhage or exudates,  Neck supple, trachea midline, no carotid bruits, no thyroidmegaly Lungs: Clear to auscultation, no rhonchi or wheezes, or rib retractions  Heart: Regular rate and rhythm, no murmurs or gallops Breast:Examined in sitting and supine position were symmetrical in appearance, no palpable masses or tenderness,  no skin retraction, no nipple inversion, no nipple discharge, no skin discoloration, no axillary or supraclavicular lymphadenopathy Abdomen: no palpable masses  or tenderness, no rebound or guarding Extremities: no edema or skin discoloration or tenderness  Pelvic:  Bartholin, Urethra, Skene Glands: Within normal limits             Vagina: No gross lesions or discharge  Cervix: No gross lesions or discharge  Uterus  anteverted, normal size, shape and consistency, non-tender and mobile  Adnexa  Without masses or tenderness  Anus and perineum  normal   Rectovaginal  normal sphincter tone without palpated masses or tenderness             Hemoccult  cards will be provided today.     Assessment/Plan:  55 y.o. female for annual exam postmenopausal doing well on hormone replacement therapy. Patient to return to the office next week in a fasting state for the following screening blood work: TSH because of her weight gain, CBC, comprehensive metabolic panel and fasting lipid profile. Patient also to schedule her bone density study as well. We discussed importance of calcium vitamin D and weightbearing exercises for osteoporosis prevention. Pap smear done today. Patient declined flu vaccine.   FERNANDEZ,JUAN H MD, 9:35 AM 01/13/2017   

## 2017-01-13 NOTE — Patient Instructions (Signed)
Bone Densitometry Bone densitometry is an imaging test that uses a special X-ray to measure the amount of calcium and other minerals in your bones (bone density). This test is also known as a bone mineral density test or dual-energy X-ray absorptiometry (DXA). The test can measure bone density at your hip and your spine. It is similar to having a regular X-ray. You may have this test to:  Diagnose a condition that causes weak or thin bones (osteoporosis).  Predict your risk of a broken bone (fracture).  Determine how well osteoporosis treatment is working. Tell a health care provider about:  Any allergies you have.  All medicines you are taking, including vitamins, herbs, eye drops, creams, and over-the-counter medicines.  Any problems you or family members have had with anesthetic medicines.  Any blood disorders you have.  Any surgeries you have had.  Any medical conditions you have.  Possibility of pregnancy.  Any other medical test you had within the previous 14 days that used contrast material. What are the risks? Generally, this is a safe procedure. However, problems can occur and may include the following:  This test exposes you to a very small amount of radiation.  The risks of radiation exposure may be greater to unborn children. What happens before the procedure?  Do not take any calcium supplements for 24 hours before having the test. You can otherwise eat and drink what you usually do.  Take off all metal jewelry, eyeglasses, dental appliances, and any other metal objects. What happens during the procedure?  You may lie on an exam table. There will be an X-ray generator below you and an imaging device above you.  Other devices, such as boxes or braces, may be used to position your body properly for the scan.  You will need to lie still while the machine slowly scans your body.  The images will show up on a computer monitor. What happens after the  procedure? You may need more testing at a later time. This information is not intended to replace advice given to you by your health care provider. Make sure you discuss any questions you have with your health care provider. Document Released: 11/15/2004 Document Revised: 03/31/2016 Document Reviewed: 04/03/2014 Elsevier Interactive Patient Education  2017 [employer]vier [employer]  

## 2017-01-13 NOTE — Addendum Note (Signed)
Addended by: Burnett Kanaris on: 01/13/2017 09:54 AM   Modules accepted: Orders

## 2017-01-17 LAB — PAP IG W/ RFLX HPV ASCU

## 2017-01-20 ENCOUNTER — Other Ambulatory Visit: Payer: Federal, State, Local not specified - PPO

## 2017-01-26 ENCOUNTER — Other Ambulatory Visit: Payer: Federal, State, Local not specified - PPO

## 2017-01-26 ENCOUNTER — Other Ambulatory Visit: Payer: Self-pay | Admitting: Gynecology

## 2017-01-26 ENCOUNTER — Ambulatory Visit (INDEPENDENT_AMBULATORY_CARE_PROVIDER_SITE_OTHER): Payer: Federal, State, Local not specified - PPO

## 2017-01-26 DIAGNOSIS — E663 Overweight: Secondary | ICD-10-CM | POA: Diagnosis not present

## 2017-01-26 DIAGNOSIS — M8588 Other specified disorders of bone density and structure, other site: Secondary | ICD-10-CM

## 2017-01-26 DIAGNOSIS — Z13820 Encounter for screening for osteoporosis: Secondary | ICD-10-CM

## 2017-01-26 DIAGNOSIS — M8580 Other specified disorders of bone density and structure, unspecified site: Secondary | ICD-10-CM

## 2017-01-26 DIAGNOSIS — Z01411 Encounter for gynecological examination (general) (routine) with abnormal findings: Secondary | ICD-10-CM | POA: Diagnosis not present

## 2017-01-26 LAB — CBC WITH DIFFERENTIAL/PLATELET
BASOS PCT: 0 %
Basophils Absolute: 0 cells/uL (ref 0–200)
Eosinophils Absolute: 80 cells/uL (ref 15–500)
Eosinophils Relative: 1 %
HEMATOCRIT: 41.5 % (ref 35.0–45.0)
HEMOGLOBIN: 13.3 g/dL (ref 11.7–15.5)
LYMPHS ABS: 3120 {cells}/uL (ref 850–3900)
Lymphocytes Relative: 39 %
MCH: 29.6 pg (ref 27.0–33.0)
MCHC: 32 g/dL (ref 32.0–36.0)
MCV: 92.2 fL (ref 80.0–100.0)
MONO ABS: 560 {cells}/uL (ref 200–950)
MPV: 9.4 fL (ref 7.5–12.5)
Monocytes Relative: 7 %
NEUTROS ABS: 4240 {cells}/uL (ref 1500–7800)
Neutrophils Relative %: 53 %
Platelets: 508 10*3/uL — ABNORMAL HIGH (ref 140–400)
RBC: 4.5 MIL/uL (ref 3.80–5.10)
RDW: 13.9 % (ref 11.0–15.0)
WBC: 8 10*3/uL (ref 3.8–10.8)

## 2017-01-26 LAB — TSH: TSH: 2.36 mIU/L

## 2017-01-26 LAB — LIPID PANEL
Cholesterol: 204 mg/dL — ABNORMAL HIGH (ref <200)
HDL: 78 mg/dL (ref >50)
LDL CALC: 112 mg/dL — AB (ref <100)
TRIGLYCERIDES: 70 mg/dL (ref <150)
Total CHOL/HDL Ratio: 2.6 Ratio (ref <5.0)
VLDL: 14 mg/dL (ref <30)

## 2017-01-26 LAB — COMPREHENSIVE METABOLIC PANEL
ALK PHOS: 61 U/L (ref 33–130)
ALT: 10 U/L (ref 6–29)
AST: 18 U/L (ref 10–35)
Albumin: 3.9 g/dL (ref 3.6–5.1)
BILIRUBIN TOTAL: 0.4 mg/dL (ref 0.2–1.2)
BUN: 12 mg/dL (ref 7–25)
CALCIUM: 8.9 mg/dL (ref 8.6–10.4)
CO2: 26 mmol/L (ref 20–31)
Chloride: 103 mmol/L (ref 98–110)
Creat: 0.64 mg/dL (ref 0.50–1.05)
GLUCOSE: 83 mg/dL (ref 65–99)
Potassium: 4.1 mmol/L (ref 3.5–5.3)
Sodium: 138 mmol/L (ref 135–146)
Total Protein: 7.2 g/dL (ref 6.1–8.1)

## 2017-02-15 DIAGNOSIS — M5441 Lumbago with sciatica, right side: Secondary | ICD-10-CM | POA: Diagnosis not present

## 2017-02-15 DIAGNOSIS — M9905 Segmental and somatic dysfunction of pelvic region: Secondary | ICD-10-CM | POA: Diagnosis not present

## 2017-02-15 DIAGNOSIS — M9903 Segmental and somatic dysfunction of lumbar region: Secondary | ICD-10-CM | POA: Diagnosis not present

## 2017-02-15 DIAGNOSIS — M25551 Pain in right hip: Secondary | ICD-10-CM | POA: Diagnosis not present

## 2017-02-17 DIAGNOSIS — M5441 Lumbago with sciatica, right side: Secondary | ICD-10-CM | POA: Diagnosis not present

## 2017-02-17 DIAGNOSIS — M9904 Segmental and somatic dysfunction of sacral region: Secondary | ICD-10-CM | POA: Diagnosis not present

## 2017-02-17 DIAGNOSIS — M9905 Segmental and somatic dysfunction of pelvic region: Secondary | ICD-10-CM | POA: Diagnosis not present

## 2017-02-17 DIAGNOSIS — M461 Sacroiliitis, not elsewhere classified: Secondary | ICD-10-CM | POA: Diagnosis not present

## 2017-02-17 DIAGNOSIS — M546 Pain in thoracic spine: Secondary | ICD-10-CM | POA: Diagnosis not present

## 2017-02-17 DIAGNOSIS — M9902 Segmental and somatic dysfunction of thoracic region: Secondary | ICD-10-CM | POA: Diagnosis not present

## 2017-02-17 DIAGNOSIS — M9903 Segmental and somatic dysfunction of lumbar region: Secondary | ICD-10-CM | POA: Diagnosis not present

## 2017-02-17 DIAGNOSIS — M25551 Pain in right hip: Secondary | ICD-10-CM | POA: Diagnosis not present

## 2017-03-22 ENCOUNTER — Encounter: Payer: Self-pay | Admitting: Gynecology

## 2017-08-25 DIAGNOSIS — M9905 Segmental and somatic dysfunction of pelvic region: Secondary | ICD-10-CM | POA: Diagnosis not present

## 2017-08-25 DIAGNOSIS — M5441 Lumbago with sciatica, right side: Secondary | ICD-10-CM | POA: Diagnosis not present

## 2017-08-25 DIAGNOSIS — M9902 Segmental and somatic dysfunction of thoracic region: Secondary | ICD-10-CM | POA: Diagnosis not present

## 2017-08-25 DIAGNOSIS — M25551 Pain in right hip: Secondary | ICD-10-CM | POA: Diagnosis not present

## 2017-08-25 DIAGNOSIS — M461 Sacroiliitis, not elsewhere classified: Secondary | ICD-10-CM | POA: Diagnosis not present

## 2017-08-25 DIAGNOSIS — M9903 Segmental and somatic dysfunction of lumbar region: Secondary | ICD-10-CM | POA: Diagnosis not present

## 2017-08-25 DIAGNOSIS — M9904 Segmental and somatic dysfunction of sacral region: Secondary | ICD-10-CM | POA: Diagnosis not present

## 2017-08-25 DIAGNOSIS — M25562 Pain in left knee: Secondary | ICD-10-CM | POA: Diagnosis not present

## 2017-08-28 DIAGNOSIS — M9903 Segmental and somatic dysfunction of lumbar region: Secondary | ICD-10-CM | POA: Diagnosis not present

## 2017-08-28 DIAGNOSIS — M9905 Segmental and somatic dysfunction of pelvic region: Secondary | ICD-10-CM | POA: Diagnosis not present

## 2017-08-28 DIAGNOSIS — M25551 Pain in right hip: Secondary | ICD-10-CM | POA: Diagnosis not present

## 2017-08-28 DIAGNOSIS — M5441 Lumbago with sciatica, right side: Secondary | ICD-10-CM | POA: Diagnosis not present

## 2017-09-01 DIAGNOSIS — M9904 Segmental and somatic dysfunction of sacral region: Secondary | ICD-10-CM | POA: Diagnosis not present

## 2017-09-01 DIAGNOSIS — M5441 Lumbago with sciatica, right side: Secondary | ICD-10-CM | POA: Diagnosis not present

## 2017-09-01 DIAGNOSIS — M9905 Segmental and somatic dysfunction of pelvic region: Secondary | ICD-10-CM | POA: Diagnosis not present

## 2017-09-01 DIAGNOSIS — M25562 Pain in left knee: Secondary | ICD-10-CM | POA: Diagnosis not present

## 2017-09-01 DIAGNOSIS — M9903 Segmental and somatic dysfunction of lumbar region: Secondary | ICD-10-CM | POA: Diagnosis not present

## 2017-09-01 DIAGNOSIS — M461 Sacroiliitis, not elsewhere classified: Secondary | ICD-10-CM | POA: Diagnosis not present

## 2017-09-01 DIAGNOSIS — M9902 Segmental and somatic dysfunction of thoracic region: Secondary | ICD-10-CM | POA: Diagnosis not present

## 2017-09-01 DIAGNOSIS — M25551 Pain in right hip: Secondary | ICD-10-CM | POA: Diagnosis not present

## 2017-09-05 DIAGNOSIS — M5441 Lumbago with sciatica, right side: Secondary | ICD-10-CM | POA: Diagnosis not present

## 2017-09-05 DIAGNOSIS — M25551 Pain in right hip: Secondary | ICD-10-CM | POA: Diagnosis not present

## 2017-09-05 DIAGNOSIS — M461 Sacroiliitis, not elsewhere classified: Secondary | ICD-10-CM | POA: Diagnosis not present

## 2017-09-05 DIAGNOSIS — M25562 Pain in left knee: Secondary | ICD-10-CM | POA: Diagnosis not present

## 2017-09-05 DIAGNOSIS — M9903 Segmental and somatic dysfunction of lumbar region: Secondary | ICD-10-CM | POA: Diagnosis not present

## 2017-09-05 DIAGNOSIS — M9905 Segmental and somatic dysfunction of pelvic region: Secondary | ICD-10-CM | POA: Diagnosis not present

## 2017-09-05 DIAGNOSIS — M9904 Segmental and somatic dysfunction of sacral region: Secondary | ICD-10-CM | POA: Diagnosis not present

## 2017-09-05 DIAGNOSIS — M9902 Segmental and somatic dysfunction of thoracic region: Secondary | ICD-10-CM | POA: Diagnosis not present

## 2017-09-08 DIAGNOSIS — M9902 Segmental and somatic dysfunction of thoracic region: Secondary | ICD-10-CM | POA: Diagnosis not present

## 2017-09-08 DIAGNOSIS — M9903 Segmental and somatic dysfunction of lumbar region: Secondary | ICD-10-CM | POA: Diagnosis not present

## 2017-09-08 DIAGNOSIS — M5441 Lumbago with sciatica, right side: Secondary | ICD-10-CM | POA: Diagnosis not present

## 2017-09-08 DIAGNOSIS — M9905 Segmental and somatic dysfunction of pelvic region: Secondary | ICD-10-CM | POA: Diagnosis not present

## 2017-09-08 DIAGNOSIS — M9904 Segmental and somatic dysfunction of sacral region: Secondary | ICD-10-CM | POA: Diagnosis not present

## 2017-09-08 DIAGNOSIS — M25551 Pain in right hip: Secondary | ICD-10-CM | POA: Diagnosis not present

## 2017-09-08 DIAGNOSIS — M461 Sacroiliitis, not elsewhere classified: Secondary | ICD-10-CM | POA: Diagnosis not present

## 2017-09-08 DIAGNOSIS — M25562 Pain in left knee: Secondary | ICD-10-CM | POA: Diagnosis not present

## 2017-09-16 DIAGNOSIS — M5441 Lumbago with sciatica, right side: Secondary | ICD-10-CM | POA: Diagnosis not present

## 2017-09-16 DIAGNOSIS — M9902 Segmental and somatic dysfunction of thoracic region: Secondary | ICD-10-CM | POA: Diagnosis not present

## 2017-09-16 DIAGNOSIS — M25551 Pain in right hip: Secondary | ICD-10-CM | POA: Diagnosis not present

## 2017-09-16 DIAGNOSIS — M9905 Segmental and somatic dysfunction of pelvic region: Secondary | ICD-10-CM | POA: Diagnosis not present

## 2017-09-16 DIAGNOSIS — M9904 Segmental and somatic dysfunction of sacral region: Secondary | ICD-10-CM | POA: Diagnosis not present

## 2017-09-16 DIAGNOSIS — M461 Sacroiliitis, not elsewhere classified: Secondary | ICD-10-CM | POA: Diagnosis not present

## 2017-09-16 DIAGNOSIS — M9903 Segmental and somatic dysfunction of lumbar region: Secondary | ICD-10-CM | POA: Diagnosis not present

## 2017-09-16 DIAGNOSIS — M25562 Pain in left knee: Secondary | ICD-10-CM | POA: Diagnosis not present

## 2017-09-18 DIAGNOSIS — M9903 Segmental and somatic dysfunction of lumbar region: Secondary | ICD-10-CM | POA: Diagnosis not present

## 2017-09-18 DIAGNOSIS — M5441 Lumbago with sciatica, right side: Secondary | ICD-10-CM | POA: Diagnosis not present

## 2017-09-18 DIAGNOSIS — M9905 Segmental and somatic dysfunction of pelvic region: Secondary | ICD-10-CM | POA: Diagnosis not present

## 2017-09-18 DIAGNOSIS — M25551 Pain in right hip: Secondary | ICD-10-CM | POA: Diagnosis not present

## 2017-09-22 DIAGNOSIS — M546 Pain in thoracic spine: Secondary | ICD-10-CM | POA: Diagnosis not present

## 2017-09-22 DIAGNOSIS — M9903 Segmental and somatic dysfunction of lumbar region: Secondary | ICD-10-CM | POA: Diagnosis not present

## 2017-09-22 DIAGNOSIS — M25551 Pain in right hip: Secondary | ICD-10-CM | POA: Diagnosis not present

## 2017-09-22 DIAGNOSIS — M461 Sacroiliitis, not elsewhere classified: Secondary | ICD-10-CM | POA: Diagnosis not present

## 2017-09-22 DIAGNOSIS — M5441 Lumbago with sciatica, right side: Secondary | ICD-10-CM | POA: Diagnosis not present

## 2017-09-22 DIAGNOSIS — M9904 Segmental and somatic dysfunction of sacral region: Secondary | ICD-10-CM | POA: Diagnosis not present

## 2017-09-22 DIAGNOSIS — M9902 Segmental and somatic dysfunction of thoracic region: Secondary | ICD-10-CM | POA: Diagnosis not present

## 2017-09-22 DIAGNOSIS — M9905 Segmental and somatic dysfunction of pelvic region: Secondary | ICD-10-CM | POA: Diagnosis not present

## 2017-11-02 DIAGNOSIS — N951 Menopausal and female climacteric states: Secondary | ICD-10-CM | POA: Diagnosis not present

## 2017-11-02 DIAGNOSIS — R635 Abnormal weight gain: Secondary | ICD-10-CM | POA: Diagnosis not present

## 2017-11-15 DIAGNOSIS — E8881 Metabolic syndrome: Secondary | ICD-10-CM | POA: Diagnosis not present

## 2017-11-15 DIAGNOSIS — E669 Obesity, unspecified: Secondary | ICD-10-CM | POA: Diagnosis not present

## 2017-11-15 DIAGNOSIS — E559 Vitamin D deficiency, unspecified: Secondary | ICD-10-CM | POA: Diagnosis not present

## 2017-11-15 DIAGNOSIS — E039 Hypothyroidism, unspecified: Secondary | ICD-10-CM | POA: Diagnosis not present

## 2017-11-24 DIAGNOSIS — E669 Obesity, unspecified: Secondary | ICD-10-CM | POA: Diagnosis not present

## 2017-11-24 DIAGNOSIS — E8881 Metabolic syndrome: Secondary | ICD-10-CM | POA: Diagnosis not present

## 2017-11-30 DIAGNOSIS — E669 Obesity, unspecified: Secondary | ICD-10-CM | POA: Diagnosis not present

## 2018-01-19 ENCOUNTER — Encounter: Payer: Federal, State, Local not specified - PPO | Admitting: Obstetrics & Gynecology

## 2018-01-24 DIAGNOSIS — M9905 Segmental and somatic dysfunction of pelvic region: Secondary | ICD-10-CM | POA: Diagnosis not present

## 2018-01-24 DIAGNOSIS — M5441 Lumbago with sciatica, right side: Secondary | ICD-10-CM | POA: Diagnosis not present

## 2018-01-24 DIAGNOSIS — M9903 Segmental and somatic dysfunction of lumbar region: Secondary | ICD-10-CM | POA: Diagnosis not present

## 2018-01-24 DIAGNOSIS — M25551 Pain in right hip: Secondary | ICD-10-CM | POA: Diagnosis not present

## 2018-01-26 DIAGNOSIS — M5441 Lumbago with sciatica, right side: Secondary | ICD-10-CM | POA: Diagnosis not present

## 2018-01-26 DIAGNOSIS — M9903 Segmental and somatic dysfunction of lumbar region: Secondary | ICD-10-CM | POA: Diagnosis not present

## 2018-01-26 DIAGNOSIS — M25551 Pain in right hip: Secondary | ICD-10-CM | POA: Diagnosis not present

## 2018-01-26 DIAGNOSIS — M9905 Segmental and somatic dysfunction of pelvic region: Secondary | ICD-10-CM | POA: Diagnosis not present

## 2018-02-05 ENCOUNTER — Other Ambulatory Visit: Payer: Self-pay

## 2018-02-12 ENCOUNTER — Other Ambulatory Visit: Payer: Self-pay | Admitting: Obstetrics & Gynecology

## 2018-02-12 DIAGNOSIS — Z1231 Encounter for screening mammogram for malignant neoplasm of breast: Secondary | ICD-10-CM

## 2018-02-15 ENCOUNTER — Telehealth: Payer: Self-pay

## 2018-02-15 MED ORDER — ESTRADIOL 0.5 MG PO TABS: 0.5000 mg | ORAL_TABLET | Freq: Every day | ORAL | 0 refills | Status: DC

## 2018-02-15 MED ORDER — PROGESTERONE MICRONIZED 200 MG PO CAPS: 200.0000 mg | ORAL_CAPSULE | Freq: Every day | ORAL | 0 refills | Status: DC

## 2018-02-15 NOTE — Telephone Encounter (Addendum)
Patient called because her refill request for HRT was denied. It was denied on 02/05/18 with note to call the office and schedule annual exam prior to refill. She has since scheduled an appointment for CE in June with Dr. Loetta Rough. Rx refills sent.  When I called patient back she said she did not have her refills at beginning of mos and did not take her Progesterone Day 1-12 this mos. She wondered what to do in regards to the Progesterone now that she has Rx.

## 2018-02-15 NOTE — Telephone Encounter (Signed)
Spoke with patient and informed her. °

## 2018-02-15 NOTE — Telephone Encounter (Signed)
Wait and take it the following month

## 2018-03-09 ENCOUNTER — Ambulatory Visit
Admission: RE | Admit: 2018-03-09 | Discharge: 2018-03-09 | Disposition: A | Discharge status: Home / Self Care | Payer: Federal, State, Local not specified - PPO | Source: Ambulatory Visit | Attending: Obstetrics & Gynecology | Admitting: Obstetrics & Gynecology

## 2018-03-09 DIAGNOSIS — Z1231 Encounter for screening mammogram for malignant neoplasm of breast: Secondary | ICD-10-CM | POA: Diagnosis not present

## 2018-04-18 ENCOUNTER — Encounter: Payer: Federal, State, Local not specified - PPO | Admitting: Gynecology

## 2018-06-01 ENCOUNTER — Ambulatory Visit: Payer: Federal, State, Local not specified - PPO | Admitting: Gynecology

## 2018-06-01 ENCOUNTER — Other Ambulatory Visit: Payer: Self-pay | Admitting: Gynecology

## 2018-06-01 ENCOUNTER — Encounter: Payer: Self-pay | Admitting: Gynecology

## 2018-06-01 VITALS — BP 124/80 | Ht 61.0 in | Wt 176.0 lb

## 2018-06-01 DIAGNOSIS — Z01419 Encounter for gynecological examination (general) (routine) without abnormal findings: Secondary | ICD-10-CM | POA: Diagnosis not present

## 2018-06-01 DIAGNOSIS — N952 Postmenopausal atrophic vaginitis: Secondary | ICD-10-CM

## 2018-06-01 DIAGNOSIS — Z79890 Hormone replacement therapy (postmenopausal): Secondary | ICD-10-CM | POA: Diagnosis not present

## 2018-06-01 NOTE — Patient Instructions (Signed)
Follow-up in 1 year for annual exam, sooner if any issues. 

## 2018-06-01 NOTE — Progress Notes (Signed)
    HARVEEN FLESCH [birth year] [id number]        57 y.o.  [id number] for annual gynecologic exam.  Former patient of Dr. Toney Rakes.  Several issues noted below.  Past medical history,surgical history, problem list, medications, allergies, family history and social history were all reviewed and documented as reviewed in the EPIC chart.  ROS:  Performed with pertinent positives and negatives included in the history, assessment and plan.   Additional significant findings : None   Exam: Caryn Bee assistant Vitals:   06/01/18 1042  BP: 124/80  Weight: 176 lb (79.8 kg)  Height: 5\' 1"  (1.549 m)   Body mass index is 33.25 kg/m.  General appearance:  Normal affect, orientation and appearance. Skin: Grossly normal HEENT: Without gross lesions.  No cervical or supraclavicular adenopathy. Thyroid normal.  Lungs:  Clear without wheezing, rales or rhonchi Cardiac: RR, without RMG Abdominal:  Soft, nontender, without masses, guarding, rebound, organomegaly or hernia Breasts:  Examined lying and sitting without masses, retractions, discharge or axillary adenopathy. Pelvic:  Ext, BUS, Vagina: Normal with mild atrophic changes  Cervix: Normal with mild atrophic changes  Uterus: Anteverted, normal size, shape and contour, midline and mobile nontender   Adnexa: Without masses or tenderness    Anus and perineum: Normal   Rectovaginal: Normal sphincter tone without palpated masses or tenderness.    Assessment/Plan:  57 y.o. [id number] female for annual gynecologic exam.   1. Postmenopausal/mild atrophic changes.  Had been on HRT per Dr. Toney Rakes to include estradiol 0.5 mg daily and Prometrium 200 mg first 12 days of each month.  Was not having withdrawal bleeding after this.  Patient has stopped her HRT and does not notice any real difference in symptomatology.  Not having significant hot flushes or sweats.  Having some mild vaginal dryness.  We discussed OTC options for this.  At this point she does  not want to go back on HRT.  She will monitor her symptoms and follow-up if she has any issues.  She will call if she has any bleeding.  I did discuss the risks versus benefits of HRT to include increased risk of thrombosis such as stroke heart attack DVT in the breast cancer issue.  Benefits to include symptom relief cardiovascular and bone health. 2. DEXA 2018 normal.  Recommend repeat DEXA at 5-year interval. 3. Mammography 03/2018.  Continue with annual mammography when due.  Breast exam normal today. 4. Colonoscopy 2016.  Repeat at their recommended interval. 5. Pap smear 2018.  No Pap smear done today.  No history of significant abnormal Pap smears.  Plan repeat Pap smear at 3-year interval per current screening guidelines. 6. Health maintenance.  No routine blood work done.  Patient is in the process of finding a primary physician and will have this done through their office.  Follow-up 1 year, sooner as needed.   Anastasio Auerbach MD, 11:27 AM 06/01/2018

## 2018-08-28 ENCOUNTER — Telehealth: Payer: Self-pay | Admitting: *Deleted

## 2018-08-28 NOTE — Telephone Encounter (Signed)
Okay to restart.  She may want to consider instead of the Prometrium for the first 12 days of each month to take the lower dose of Prometrium 100 mg nightly throughout the month along with her estradiol tablet.  This will avoid having to remember to take the progesterone in the beginning of each month.

## 2018-08-28 NOTE — Telephone Encounter (Signed)
Patient has been off HRT, would like to start back on estradiol 0.5 mg tablet and Prometrium 200 mg day 1-12, due to hot flashes. Okay to send RX?

## 2018-08-29 MED ORDER — ESTRADIOL 0.5 MG PO TABS: 0.5000 mg | ORAL_TABLET | Freq: Every day | ORAL | 2 refills | Status: DC

## 2018-08-29 MED ORDER — PROGESTERONE MICRONIZED 100 MG PO CAPS: 100.0000 mg | ORAL_CAPSULE | Freq: Every day | ORAL | 2 refills | Status: DC

## 2018-08-29 NOTE — Telephone Encounter (Signed)
Patient informed, new Rx sent for Prometrium 100 mg daily at bedtime

## 2018-08-31 DIAGNOSIS — M25512 Pain in left shoulder: Secondary | ICD-10-CM | POA: Diagnosis not present

## 2019-02-12 DIAGNOSIS — G894 Chronic pain syndrome: Secondary | ICD-10-CM | POA: Diagnosis not present

## 2019-02-14 DIAGNOSIS — G894 Chronic pain syndrome: Secondary | ICD-10-CM | POA: Diagnosis not present

## 2019-02-19 DIAGNOSIS — G894 Chronic pain syndrome: Secondary | ICD-10-CM | POA: Diagnosis not present

## 2019-02-21 DIAGNOSIS — G894 Chronic pain syndrome: Secondary | ICD-10-CM | POA: Diagnosis not present

## 2019-02-28 DIAGNOSIS — G894 Chronic pain syndrome: Secondary | ICD-10-CM | POA: Diagnosis not present

## 2019-03-05 DIAGNOSIS — G894 Chronic pain syndrome: Secondary | ICD-10-CM | POA: Diagnosis not present

## 2019-03-07 DIAGNOSIS — G894 Chronic pain syndrome: Secondary | ICD-10-CM | POA: Diagnosis not present

## 2019-03-08 DIAGNOSIS — G894 Chronic pain syndrome: Secondary | ICD-10-CM | POA: Diagnosis not present

## 2019-03-12 DIAGNOSIS — G894 Chronic pain syndrome: Secondary | ICD-10-CM | POA: Diagnosis not present

## 2019-03-15 DIAGNOSIS — G894 Chronic pain syndrome: Secondary | ICD-10-CM | POA: Diagnosis not present

## 2019-03-19 DIAGNOSIS — G894 Chronic pain syndrome: Secondary | ICD-10-CM | POA: Diagnosis not present

## 2019-03-22 DIAGNOSIS — G894 Chronic pain syndrome: Secondary | ICD-10-CM | POA: Diagnosis not present

## 2019-03-28 DIAGNOSIS — G894 Chronic pain syndrome: Secondary | ICD-10-CM | POA: Diagnosis not present

## 2019-04-02 DIAGNOSIS — G894 Chronic pain syndrome: Secondary | ICD-10-CM | POA: Diagnosis not present

## 2019-04-05 DIAGNOSIS — G894 Chronic pain syndrome: Secondary | ICD-10-CM | POA: Diagnosis not present

## 2019-04-08 DIAGNOSIS — G894 Chronic pain syndrome: Secondary | ICD-10-CM | POA: Diagnosis not present

## 2019-04-10 DIAGNOSIS — G894 Chronic pain syndrome: Secondary | ICD-10-CM | POA: Diagnosis not present

## 2019-05-17 DIAGNOSIS — G894 Chronic pain syndrome: Secondary | ICD-10-CM | POA: Diagnosis not present

## 2019-05-20 DIAGNOSIS — G894 Chronic pain syndrome: Secondary | ICD-10-CM | POA: Diagnosis not present

## 2019-05-23 DIAGNOSIS — G894 Chronic pain syndrome: Secondary | ICD-10-CM | POA: Diagnosis not present

## 2019-05-29 DIAGNOSIS — G894 Chronic pain syndrome: Secondary | ICD-10-CM | POA: Diagnosis not present

## 2019-05-30 DIAGNOSIS — G894 Chronic pain syndrome: Secondary | ICD-10-CM | POA: Diagnosis not present

## 2019-06-25 DIAGNOSIS — G894 Chronic pain syndrome: Secondary | ICD-10-CM | POA: Diagnosis not present

## 2019-06-28 DIAGNOSIS — G894 Chronic pain syndrome: Secondary | ICD-10-CM | POA: Diagnosis not present

## 2019-07-02 DIAGNOSIS — G894 Chronic pain syndrome: Secondary | ICD-10-CM | POA: Diagnosis not present

## 2019-07-04 DIAGNOSIS — G894 Chronic pain syndrome: Secondary | ICD-10-CM | POA: Diagnosis not present

## 2019-07-08 DIAGNOSIS — G894 Chronic pain syndrome: Secondary | ICD-10-CM | POA: Diagnosis not present

## 2019-07-10 DIAGNOSIS — G894 Chronic pain syndrome: Secondary | ICD-10-CM | POA: Diagnosis not present

## 2019-07-19 DIAGNOSIS — G894 Chronic pain syndrome: Secondary | ICD-10-CM | POA: Diagnosis not present

## 2019-07-30 DIAGNOSIS — G894 Chronic pain syndrome: Secondary | ICD-10-CM | POA: Diagnosis not present

## 2019-08-01 DIAGNOSIS — G894 Chronic pain syndrome: Secondary | ICD-10-CM | POA: Diagnosis not present

## 2019-08-06 ENCOUNTER — Encounter: Payer: Self-pay | Admitting: Gynecology

## 2019-08-09 DIAGNOSIS — G894 Chronic pain syndrome: Secondary | ICD-10-CM | POA: Diagnosis not present

## 2019-08-14 DIAGNOSIS — G894 Chronic pain syndrome: Secondary | ICD-10-CM | POA: Diagnosis not present

## 2019-08-16 DIAGNOSIS — G894 Chronic pain syndrome: Secondary | ICD-10-CM | POA: Diagnosis not present

## 2019-08-26 DIAGNOSIS — G894 Chronic pain syndrome: Secondary | ICD-10-CM | POA: Diagnosis not present

## 2019-08-28 DIAGNOSIS — G894 Chronic pain syndrome: Secondary | ICD-10-CM | POA: Diagnosis not present

## 2019-09-03 DIAGNOSIS — G894 Chronic pain syndrome: Secondary | ICD-10-CM | POA: Diagnosis not present

## 2019-09-06 ENCOUNTER — Other Ambulatory Visit: Payer: Self-pay | Admitting: Gynecology

## 2019-09-06 DIAGNOSIS — Z1231 Encounter for screening mammogram for malignant neoplasm of breast: Secondary | ICD-10-CM

## 2019-09-06 DIAGNOSIS — G894 Chronic pain syndrome: Secondary | ICD-10-CM | POA: Diagnosis not present

## 2019-09-19 ENCOUNTER — Ambulatory Visit
Admission: RE | Admit: 2019-09-19 | Discharge: 2019-09-19 | Disposition: A | Discharge status: Home / Self Care | Payer: Federal, State, Local not specified - PPO | Source: Ambulatory Visit | Attending: Gynecology | Admitting: Gynecology

## 2019-09-19 ENCOUNTER — Other Ambulatory Visit: Payer: Self-pay

## 2019-09-19 DIAGNOSIS — Z1231 Encounter for screening mammogram for malignant neoplasm of breast: Secondary | ICD-10-CM | POA: Diagnosis not present

## 2019-09-26 DIAGNOSIS — G894 Chronic pain syndrome: Secondary | ICD-10-CM | POA: Diagnosis not present

## 2019-10-09 DIAGNOSIS — G894 Chronic pain syndrome: Secondary | ICD-10-CM | POA: Diagnosis not present

## 2019-10-30 DIAGNOSIS — G894 Chronic pain syndrome: Secondary | ICD-10-CM | POA: Diagnosis not present

## 2019-11-15 DIAGNOSIS — M170 Bilateral primary osteoarthritis of knee: Secondary | ICD-10-CM | POA: Diagnosis not present

## 2020-01-13 ENCOUNTER — Other Ambulatory Visit: Payer: Self-pay

## 2020-01-13 ENCOUNTER — Ambulatory Visit: Payer: Federal, State, Local not specified - PPO | Admitting: Critical Care Medicine

## 2020-01-13 VITALS — BP 124/75 | HR 95 | Temp 98.5°F | Resp 18 | Ht 64.0 in

## 2020-01-13 DIAGNOSIS — Z23 Encounter for immunization: Secondary | ICD-10-CM

## 2020-01-13 DIAGNOSIS — E7800 Pure hypercholesterolemia, unspecified: Secondary | ICD-10-CM

## 2020-01-13 DIAGNOSIS — I10 Essential (primary) hypertension: Secondary | ICD-10-CM | POA: Diagnosis not present

## 2020-01-13 DIAGNOSIS — M170 Bilateral primary osteoarthritis of knee: Secondary | ICD-10-CM

## 2020-01-13 DIAGNOSIS — G8929 Other chronic pain: Secondary | ICD-10-CM | POA: Insufficient documentation

## 2020-01-13 DIAGNOSIS — D473 Essential (hemorrhagic) thrombocythemia: Secondary | ICD-10-CM | POA: Diagnosis not present

## 2020-01-13 DIAGNOSIS — Z6835 Body mass index (BMI) 35.0-35.9, adult: Secondary | ICD-10-CM | POA: Diagnosis not present

## 2020-01-13 DIAGNOSIS — M25561 Pain in right knee: Secondary | ICD-10-CM | POA: Insufficient documentation

## 2020-01-13 DIAGNOSIS — M25562 Pain in left knee: Secondary | ICD-10-CM

## 2020-01-13 DIAGNOSIS — N951 Menopausal and female climacteric states: Secondary | ICD-10-CM

## 2020-01-13 HISTORY — DX: Other chronic pain: G89.29

## 2020-01-13 HISTORY — DX: Bilateral primary osteoarthritis of knee: M17.0

## 2020-01-13 NOTE — Assessment & Plan Note (Signed)
History of menopausal hot flashes   Patient is currently off hormonal therapy at this time and is wishing to be reconnected with a new gynecologist as her former gynecologist just retired in December 2020  Been to connect this patient with Dr. Edwinna Areola to see if she can reestablish with that practice for her gynecologic care  She also needs a Pap smear and she will obtain that at that visit

## 2020-01-13 NOTE — Progress Notes (Signed)
Subjective:    Patient ID: Carol Franklin, female    DOB: [birth year], 59 y.o.   MRN: [id number]  This is a pleasant 59 year old female seen today in the mobile medicine unit for general physical evaluation.  She has not had a primary care provider in over 3 years.  She also lost her gynecologist who retired in December.  She has had a history of hot flashes after her menopause that occurred in 2017.  She has been on estrogen and progesterone hormonal therapy but has not been on these medications for several months now.  She states the hot flashes of come back again at this time.  She has been labeled as having hypertension but she has not been on medications and her blood pressure is reported has been normal for the past 5 years.  She had loss some weight between 2016 and 2019 however she states she has gained during the Covid pandemic up to now where she is at 188 pounds from 176 and 2019  Patient denies any shortness of breath cough chest pain neurologic skin or other conditions.  She does relate chronic left knee pain from arthritis.  She had an arthroscopy in the left knee for chondroplasty by orthopedics 3 years ago.  He had steroid injections without much improvement   The patient states he exercises she was given when she was sent to physical therapy by orthopedics if not then follow-up with recent.  Orthopedic did tell her that she would eventually need a left knee replacement  She does have an old prescription for phentermine but does not take this on a regular basis for weight loss  The patient also has history of thrombocytosis with platelet count greater than 500,000 last checked however in 2018   there are no current symptoms of thrombocytosis including no change in vision, headaches, neurologic change, bruising or bleeding,     Wt Readings from Last 3 Encounters:  06/01/18 176 lb (79.8 kg)  01/13/17 179 lb (81.2 kg)  12/04/15 190 lb (86.2 kg)  01/13/2020: 188#  Past Medical  History:  Diagnosis Date  . Hypertension   . Intertrigo   . Post-operative nausea and vomiting   . Reactive thrombocytosis 2011     Family History  Problem Relation Age of Onset  . Hypertension Father   . Heart disease Father   . Breast cancer Maternal Aunt   . Thyroid disease Mother   . Diabetes Maternal Grandmother   . Cancer Paternal Grandmother        colon     Social History   Socioeconomic History  . Marital status: Married    Spouse name: Not on file  . Number of children: Not on file  . Years of education: Not on file  . Highest education level: Not on file  Occupational History  . Not on file  Tobacco Use  . Smoking status: Never Smoker  . Smokeless tobacco: Never Used  Substance and Sexual Activity  . Alcohol use: No    Alcohol/week: 0.0 standard drinks    Comment: rare  . Drug use: No  . Sexual activity: Yes    Birth control/protection: Post-menopausal    Comment: 1st intercourse 35 yo-1 partner  Other Topics Concern  . Not on file  Social History Narrative  . Not on file   Social Determinants of Health   Financial Resource Strain:   . Difficulty of Paying Living Expenses: Not on file  Food Insecurity:   .  Worried About Charity fundraiser in the Last Year: Not on file  . Ran Out of Food in the Last Year: Not on file  Transportation Needs:   . Lack of Transportation (Medical): Not on file  . Lack of Transportation (Non-Medical): Not on file  Physical Activity:   . Days of Exercise per Week: Not on file  . Minutes of Exercise per Session: Not on file  Stress:   . Feeling of Stress : Not on file  Social Connections:   . Frequency of Communication with Friends and Family: Not on file  . Frequency of Social Gatherings with Friends and Family: Not on file  . Attends Religious Services: Not on file  . Active Member of Clubs or Organizations: Not on file  . Attends Archivist Meetings: Not on file  . Marital Status: Not on file   Intimate Partner Violence:   . Fear of Current or Ex-Partner: Not on file  . Emotionally Abused: Not on file  . Physically Abused: Not on file  . Sexually Abused: Not on file     Allergies  Allergen Reactions  . Naproxen Anxiety    Increases heart rate/makes her feel faint     Outpatient Medications Prior to Visit  Medication Sig Dispense Refill  . ibuprofen (ADVIL) 200 MG tablet Take 200 mg by mouth every 6 (six) hours as needed.    . calcium carbonate (OS-CAL) 600 MG TABS Take 600 mg by mouth 2 (two) times daily with a meal.    . estradiol (ESTRACE) 0.5 MG tablet Take 1 tablet (0.5 mg total) by mouth daily. (Patient not taking: Reported on 01/13/2020) 90 tablet 2  . ibuprofen (ADVIL) 800 MG tablet Take 800 mg by mouth 3 (three) times daily.    . phentermine (ADIPEX-P) 37.5 MG tablet Take 37.5 mg by mouth daily.    . progesterone (PROMETRIUM) 100 MG capsule Take 1 capsule (100 mg total) by mouth at bedtime. (Patient not taking: Reported on 01/13/2020) 90 capsule 2   No facility-administered medications prior to visit.      Review of Systems  HENT: Negative.   Eyes: Negative.   Respiratory: Negative.   Cardiovascular: Negative.   Gastrointestinal: Negative.   Genitourinary: Negative.   Musculoskeletal: Positive for arthralgias.  Skin: Negative.   Neurological: Negative for seizures, speech difficulty and headaches.  Hematological: Negative.   Psychiatric/Behavioral: Negative.        Objective:   Physical Exam Vitals:   01/13/20 1538  BP: 124/75  Pulse: 95  Resp: 18  Temp: 98.5 F (36.9 C)  TempSrc: Oral  SpO2: 98%  Height: 5\' 4"  (1.626 m)    Gen: Pleasant, mildly obese, in no distress,  normal affect  ENT: No lesions,  mouth clear,  oropharynx clear, no postnasal drip, There are cold fillings in both upper and lower molars no evidence of current caries and periodontal disease is minimal  Neck: No JVD, no TMG, no carotid bruits  Lungs: No use of accessory  muscles, no dullness to percussion, clear without rales or rhonchi  Cardiovascular: RRR, heart sounds normal, no murmur or gallops, no peripheral edema  Abdomen: soft and NT, no HSM,  BS normal  Musculoskeletal: No deformities, no cyanosis or clubbing, limited flexion to the left knee with tenderness along the patellar tendon and the medial femoral condyle.  There is no subluxation of the patella.  The anterior and posterior cruciate ligaments appear to be intact based on gross physical examination  Neuro: alert, non focal  Skin: Warm, no lesions or rashes  All labs and x-rays were reviewed in the  epic system      Assessment & Plan:  I personally reviewed all images and lab data in the [facility] system as well as any outside material available during this office visit and agree with the  radiology impressions.   HTN (hypertension) The blood pressure has consistently been normal therefore I am going to resolve this problem  The patient is not currently on antihypertensives  Essential thrombocytosis History of essential thrombocytosis last measured in 2018  Plan will be to repeat CBC at this visit  BMI 35.0-35.9,adult BMI of 35 with increased weight gain over the past year  We discussed changes in diet and also I went over the patient potential exercises she can do particularly in relationship her left knee pain and I placed many of these recommendations on the AVS   Hyperlipidemia Last lipid panel was in 2018 with elevated cholesterol, repeat lipid panel at this visit  Menopausal hot flushes History of menopausal hot flashes   Patient is currently off hormonal therapy at this time and is wishing to be reconnected with a new gynecologist as her former gynecologist just retired in December 2020  Been to connect this patient with Dr. 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 Anna Genre was seen today for hot flashes.  Diagnoses and all orders for this visit:  Essential thrombocytosis ([place]) -     CBC with Differential/Platelet; Future -     CBC with Differential/Platelet  Pure hypercholesterolemia -     Comprehensive metabolic panel -     Lipid Panel  Essential hypertension -     Comprehensive metabolic panel  BMI 0000000  Menopausal hot flushes  Chronic pain of left knee  Other orders -     Tdap vaccine greater than or equal to 7yo IM  A Tdap was also given at this visit

## 2020-01-13 NOTE — Assessment & Plan Note (Addendum)
BMI of 35 with increased weight gain over the past year  We discussed changes in diet and also I went over the patient potential exercises she can do particularly in relationship her left knee pain and I placed many of these recommendations on the AVS

## 2020-01-13 NOTE — Assessment & Plan Note (Signed)
History of essential thrombocytosis last measured in 2018  Plan will be to repeat CBC at this visit

## 2020-01-13 NOTE — Assessment & Plan Note (Signed)
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 Suzi Roots

## 2020-01-13 NOTE — Assessment & Plan Note (Signed)
Last lipid panel was in 2018 with elevated cholesterol, repeat lipid panel at this visit

## 2020-01-13 NOTE — Patient Instructions (Signed)
Labs today blood counts, metabolic panel, lipid panel  Tdap vaccine given  Please obtain an appointment with Lyman Speller MD for gynecology  Try knee exercises below  Journal for Nurse Practitioners, 15(4), [phone]. Retrieved August 13, 2018 from [email or web address]  Knee Exercises Ask your health care provider which exercises are safe for you. Do exercises exactly as told by your health care provider and adjust them as directed. It is normal to feel mild stretching, pulling, tightness, or discomfort as you do these exercises. Stop right away if you feel sudden pain or your pain gets worse. Do not begin these exercises until told by your health care provider. Stretching and range-of-motion exercises These exercises warm up your muscles and joints and improve the movement and flexibility of your knee. These exercises also help to relieve pain and swelling. Knee extension, prone 1. Lie on your abdomen (prone position) on a bed. 2. Place your left / right knee just beyond the edge of the surface so your knee is not on the bed. You can put a towel under your left / right thigh just above your kneecap for comfort. 3. Relax your leg muscles and allow gravity to straighten your knee (extension). You should feel a stretch behind your left / right knee. 4. Hold this position for __________ seconds. 5. Scoot up so your knee is supported between repetitions. Repeat __________ times. Complete this exercise __________ times a day. Knee flexion, active  1. Lie on your back with both legs straight. If this causes back discomfort, bend your left / right knee so your foot is flat on the floor. 2. Slowly slide your left / right heel back toward your buttocks. Stop when you feel a gentle stretch in the front of your knee or thigh (flexion). 3. Hold this position for __________ seconds. 4. Slowly slide your left / right heel back to the starting position. Repeat __________ times. Complete  this exercise __________ times a day. Quadriceps stretch, prone  1. Lie on your abdomen on a firm surface, such as a bed or padded floor. 2. Bend your left / right knee and hold your ankle. If you cannot reach your ankle or pant leg, loop a belt around your foot and grab the belt instead. 3. Gently pull your heel toward your buttocks. Your knee should not slide out to the side. You should feel a stretch in the front of your thigh and knee (quadriceps). 4. Hold this position for __________ seconds. Repeat __________ times. Complete this exercise __________ times a day. Hamstring, supine 1. Lie on your back (supine position). 2. Loop a belt or towel over the ball of your left / right foot. The ball of your foot is on the walking surface, right under your toes. 3. Straighten your left / right knee and slowly pull on the belt to raise your leg until you feel a gentle stretch behind your knee (hamstring). ? Do not let your knee bend while you do this. ? Keep your other leg flat on the floor. 4. Hold this position for __________ seconds. Repeat __________ times. Complete this exercise __________ times a day. Strengthening exercises These exercises build strength and endurance in your knee. Endurance is the ability to use your muscles for a long time, even after they get tired. Quadriceps, isometric This exercise stretches the muscles in front of your thigh (quadriceps) without moving your knee joint (isometric). 1. Lie on your back with your left / right leg extended and your other knee  bent. Put a rolled towel or small pillow under your knee if told by your health care provider. 2. Slowly tense the muscles in the front of your left / right thigh. You should see your kneecap slide up toward your hip or see increased dimpling just above the knee. This motion will push the back of the knee toward the floor. 3. For __________ seconds, hold the muscle as tight as you can without increasing your  pain. 4. Relax the muscles slowly and completely. Repeat __________ times. Complete this exercise __________ times a day. Straight leg raises This exercise stretches the muscles in front of your thigh (quadriceps) and the muscles that move your hips (hip flexors). 1. Lie on your back with your left / right leg extended and your other knee bent. 2. Tense the muscles in the front of your left / right thigh. You should see your kneecap slide up or see increased dimpling just above the knee. Your thigh may even shake a bit. 3. Keep these muscles tight as you raise your leg 4-6 inches (10-15 cm) off the floor. Do not let your knee bend. 4. Hold this position for __________ seconds. 5. Keep these muscles tense as you lower your leg. 6. Relax your muscles slowly and completely after each repetition. Repeat __________ times. Complete this exercise __________ times a day. Hamstring, isometric 1. Lie on your back on a firm surface. 2. Bend your left / right knee about __________ degrees. 3. Dig your left / right heel into the surface as if you are trying to pull it toward your buttocks. Tighten the muscles in the back of your thighs (hamstring) to "dig" as hard as you can without increasing any pain. 4. Hold this position for __________ seconds. 5. Release the tension gradually and allow your muscles to relax completely for __________ seconds after each repetition. Repeat __________ times. Complete this exercise __________ times a day. Hamstring curls If told by your health care provider, do this exercise while wearing ankle weights. Begin with __________ lb weights. Then increase the weight by 1 lb (0.5 kg) increments. Do not wear ankle weights that are more than __________ lb. 1. Lie on your abdomen with your legs straight. 2. Bend your left / right knee as far as you can without feeling pain. Keep your hips flat against the floor. 3. Hold this position for __________ seconds. 4. Slowly lower your  leg to the starting position. Repeat __________ times. Complete this exercise __________ times a day. Squats This exercise strengthens the muscles in front of your thigh and knee (quadriceps). 1. Stand in front of a table, with your feet and knees pointing straight ahead. You may rest your hands on the table for balance but not for support. 2. Slowly bend your knees and lower your hips like you are going to sit in a chair. ? Keep your weight over your heels, not over your toes. ? Keep your lower legs upright so they are parallel with the table legs. ? Do not let your hips go lower than your knees. ? Do not bend lower than told by your health care provider. ? If your knee pain increases, do not bend as low. 3. Hold the squat position for __________ seconds. 4. Slowly push with your legs to return to standing. Do not use your hands to pull yourself to standing. Repeat __________ times. Complete this exercise __________ times a day. Wall slides This exercise strengthens the muscles in front of your thigh and  knee (quadriceps). 1. Lean your back against a smooth wall or door, and walk your feet out 18-24 inches (46-61 cm) from it. 2. Place your feet hip-width apart. 3. Slowly slide down the wall or door until your knees bend __________ degrees. Keep your knees over your heels, not over your toes. Keep your knees in line with your hips. 4. Hold this position for __________ seconds. Repeat __________ times. Complete this exercise __________ times a day. Straight leg raises This exercise strengthens the muscles that rotate the leg at the hip and move it away from your body (hip abductors). 1. Lie on your side with your left / right leg in the top position. Lie so your head, shoulder, knee, and hip line up. You may bend your bottom knee to help you keep your balance. 2. Roll your hips slightly forward so your hips are stacked directly over each other and your left / right knee is facing  forward. 3. Leading with your heel, lift your top leg 4-6 inches (10-15 cm). You should feel the muscles in your outer hip lifting. ? Do not let your foot drift forward. ? Do not let your knee roll toward the ceiling. 4. Hold this position for __________ seconds. 5. Slowly return your leg to the starting position. 6. Let your muscles relax completely after each repetition. Repeat __________ times. Complete this exercise __________ times a day. Straight leg raises This exercise stretches the muscles that move your hips away from the front of the pelvis (hip extensors). 1. Lie on your abdomen on a firm surface. You can put a pillow under your hips if that is more comfortable. 2. Tense the muscles in your buttocks and lift your left / right leg about 4-6 inches (10-15 cm). Keep your knee straight as you lift your leg. 3. Hold this position for __________ seconds. 4. Slowly lower your leg to the starting position. 5. Let your leg relax completely after each repetition. Repeat __________ times. Complete this exercise __________ times a day. This information is not intended to replace advice given to you by your health care provider. Make sure you discuss any questions you have with your health care provider. Document Revised: 08/14/2018 Document Reviewed: 08/14/2018 Elsevier Patient Education  2020 [employer].

## 2020-01-13 NOTE — Assessment & Plan Note (Signed)
The blood pressure has consistently been normal therefore I am going to resolve this problem  The patient is not currently on antihypertensives

## 2020-01-14 LAB — CBC WITH DIFFERENTIAL/PLATELET
Basophils Absolute: 0.1 10*3/uL (ref 0.0–0.2)
Basos: 1 %
EOS (ABSOLUTE): 0.1 10*3/uL (ref 0.0–0.4)
Eos: 1 %
Hematocrit: 42.5 % (ref 34.0–46.6)
Hemoglobin: 14 g/dL (ref 11.1–15.9)
Immature Grans (Abs): 0 10*3/uL (ref 0.0–0.1)
Immature Granulocytes: 0 %
Lymphocytes Absolute: 4.4 10*3/uL — ABNORMAL HIGH (ref 0.7–3.1)
Lymphs: 42 %
MCH: 29.6 pg (ref 26.6–33.0)
MCHC: 32.9 g/dL (ref 31.5–35.7)
MCV: 90 fL (ref 79–97)
Monocytes Absolute: 0.7 10*3/uL (ref 0.1–0.9)
Monocytes: 7 %
Neutrophils Absolute: 5.2 10*3/uL (ref 1.4–7.0)
Neutrophils: 49 %
Platelets: 484 10*3/uL — ABNORMAL HIGH (ref 150–450)
RBC: 4.73 x10E6/uL (ref 3.77–5.28)
RDW: 13.1 % (ref 11.7–15.4)
WBC: 10.4 10*3/uL (ref 3.4–10.8)

## 2020-01-14 LAB — LIPID PANEL
Chol/HDL Ratio: 2.8 ratio (ref 0.0–4.4)
Cholesterol, Total: 189 mg/dL (ref 100–199)
HDL: 68 mg/dL (ref >39)
LDL Chol Calc (NIH): 106 mg/dL — ABNORMAL HIGH (ref 0–99)
Triglycerides: 85 mg/dL (ref 0–149)
VLDL Cholesterol Cal: 15 mg/dL (ref 5–40)

## 2020-01-14 LAB — COMPREHENSIVE METABOLIC PANEL
ALT: 12 IU/L (ref 0–32)
AST: 21 IU/L (ref 0–40)
Albumin/Globulin Ratio: 1.3 (ref 1.2–2.2)
Albumin: 4.2 g/dL (ref 3.8–4.9)
Alkaline Phosphatase: 98 IU/L (ref 39–117)
BUN/Creatinine Ratio: 11 (ref 9–23)
BUN: 8 mg/dL (ref 6–24)
Bilirubin Total: 0.5 mg/dL (ref 0.0–1.2)
CO2: 23 mmol/L (ref 20–29)
Calcium: 10 mg/dL (ref 8.7–10.2)
Chloride: 100 mmol/L (ref 96–106)
Creatinine, Ser: 0.73 mg/dL (ref 0.57–1.00)
GFR calc Af Amer: 106 mL/min/{1.73_m2} (ref >59)
GFR calc non Af Amer: 92 mL/min/{1.73_m2} (ref >59)
Globulin, Total: 3.2 g/dL (ref 1.5–4.5)
Glucose: 97 mg/dL (ref 65–99)
Potassium: 3.7 mmol/L (ref 3.5–5.2)
Sodium: 137 mmol/L (ref 134–144)
Total Protein: 7.4 g/dL (ref 6.0–8.5)

## 2020-02-07 ENCOUNTER — Ambulatory Visit: Payer: Federal, State, Local not specified - PPO | Admitting: Medical-Surgical

## 2020-02-14 ENCOUNTER — Encounter: Payer: Self-pay | Admitting: Medical-Surgical

## 2020-02-14 ENCOUNTER — Ambulatory Visit (INDEPENDENT_AMBULATORY_CARE_PROVIDER_SITE_OTHER): Payer: Federal, State, Local not specified - PPO | Admitting: Medical-Surgical

## 2020-02-14 ENCOUNTER — Other Ambulatory Visit: Payer: Self-pay

## 2020-02-14 VITALS — BP 124/83 | HR 85 | Ht 61.0 in | Wt 192.0 lb

## 2020-02-14 DIAGNOSIS — L309 Dermatitis, unspecified: Secondary | ICD-10-CM | POA: Insufficient documentation

## 2020-02-14 DIAGNOSIS — R635 Abnormal weight gain: Secondary | ICD-10-CM | POA: Insufficient documentation

## 2020-02-14 DIAGNOSIS — M25561 Pain in right knee: Secondary | ICD-10-CM

## 2020-02-14 DIAGNOSIS — M25562 Pain in left knee: Secondary | ICD-10-CM

## 2020-02-14 DIAGNOSIS — G8929 Other chronic pain: Secondary | ICD-10-CM

## 2020-02-14 DIAGNOSIS — N951 Menopausal and female climacteric states: Secondary | ICD-10-CM

## 2020-02-14 HISTORY — DX: Dermatitis, unspecified: L30.9

## 2020-02-14 HISTORY — DX: Abnormal weight gain: R63.5

## 2020-02-14 MED ORDER — TRIAMCINOLONE ACETONIDE 0.1 % EX CREA: 1.0000 "application " | TOPICAL_CREAM | Freq: Two times a day (BID) | CUTANEOUS | 0 refills | Status: DC

## 2020-02-14 MED ORDER — MELOXICAM 15 MG PO TABS: ORAL_TABLET | ORAL | 0 refills | Status: DC

## 2020-02-14 NOTE — Progress Notes (Signed)
New Patient Office Visit  Subjective:  Patient ID: Carol Franklin, female    DOB: December 21, 1960  Age: 59 y.o. MRN: [id number]  CC:  Chief Complaint  Patient presents with  . Establish Care  . Rash    HPI Carol Franklin presents to establish care.   Bilateral knee pain-followed by orthopedics.  Chronic bilateral knee pain worse on the left.  History of arthroscopy in early 20s.  Reports she has been told she needs a total knee replacement on the left but is trying to hold out until she is a little older.  Knee pain affecting every day activities and hindering ability to navigate stairs and exercise.  Previously did physical therapy which she reports helps some but pain has worsened since she stopped.  Currently taking ibuprofen as needed for pain management.  Eczema-reports a history of sensitive skin and eczema.  Currently has a small rash on her right forearm that she has been using a cream she obtained from her friend on.  She feels that the cream helps a little but has not successfully resolve the rash.  Uses moisturizing lotions/creams daily.  Hot flashes-reports menopause onset at age 48.  Has done hormone replacement therapy in the past using patches then transitioning to pills.  Stopped hormone replacement therapy approximately 3 years ago.  Reports she continues to have nightly hot flashes that interfere with her ability to sleep.  This is followed by OB/GYN where she also receives her well woman health care.  Weight concerns-feels that she has gained approximately 20 pounds over the last year.  Not currently exercising and no dietary modifications.  Has an appointment with [facility] in [place] at the end of this month.  Past Medical History:  Diagnosis Date  . Hypertension   . Intertrigo   . Post-operative nausea and vomiting   . Reactive thrombocytosis 2011    Past Surgical History:  Procedure Laterality Date  . CESAREAN SECTION     X2  . DILATION AND CURETTAGE  OF UTERUS    . KNEE SURGERY     left    Family History  Problem Relation Age of Onset  . Hypertension Father   . Heart disease Father   . Breast cancer Maternal Aunt   . Thyroid disease Mother   . Diabetes Maternal Grandmother   . Cancer Paternal Grandmother        colon    Social History   Socioeconomic History  . Marital status: Married    Spouse name: Not on file  . Number of children: Not on file  . Years of education: Not on file  . Highest education level: Not on file  Occupational History  . Not on file  Tobacco Use  . Smoking status: Never Smoker  . Smokeless tobacco: Never Used  Substance and Sexual Activity  . Alcohol use: No    Alcohol/week: 0.0 standard drinks    Comment: rare  . Drug use: No  . Sexual activity: Yes    Birth control/protection: Post-menopausal    Comment: 1st intercourse 72 yo-1 partner  Other Topics Concern  . Not on file  Social History Narrative  . Not on file   Social Determinants of Health   Financial Resource Strain:   . Difficulty of Paying Living Expenses:   Food Insecurity:   . Worried About Charity fundraiser in the Last Year:   . Arboriculturist in the Last Year:   [employer]  Needs:   . Lack of Transportation (Medical):   Marland Kitchen Lack of Transportation (Non-Medical):   Physical Activity:   . Days of Exercise per Week:   . Minutes of Exercise per Session:   Stress:   . Feeling of Stress :   Social Connections:   . Frequency of Communication with Friends and Family:   . Frequency of Social Gatherings with Friends and Family:   . Attends Religious Services:   . Active Member of Clubs or Organizations:   . Attends Archivist Meetings:   Marland Kitchen Marital Status:   Intimate Partner Violence:   . Fear of Current or Ex-Partner:   . Emotionally Abused:   Marland Kitchen Physically Abused:   . Sexually Abused:     ROS Review of Systems  Constitutional: Negative for chills and fever.  Respiratory: Negative for cough, chest  tightness and shortness of breath.   Cardiovascular: Positive for palpitations (With increased caffeine intake). Negative for chest pain and leg swelling.  Neurological: Negative for dizziness and headaches.  Psychiatric/Behavioral: Positive for sleep disturbance (Hot flashes).    Objective:   Today's Vitals: BP 124/83   Pulse 85   Ht 5\' 1"  (1.549 m)   Wt 192 lb (87.1 kg)   LMP 11/17/2015   SpO2 97%   BMI 36.28 kg/m   Physical Exam Vitals reviewed.  Constitutional:      General: She is not in acute distress.    Appearance: Normal appearance.  HENT:     Head: Normocephalic and atraumatic.  Cardiovascular:     Rate and Rhythm: Normal rate and regular rhythm.     Pulses: Normal pulses.     Heart sounds: Normal heart sounds. No murmur. No friction rub. No gallop.   Pulmonary:     Effort: Pulmonary effort is normal. No respiratory distress.     Breath sounds: Normal breath sounds. No wheezing.  Skin:    General: Skin is warm and dry.  Neurological:     Mental Status: She is alert and oriented to person, place, and time.  Psychiatric:        Mood and Affect: Mood normal.        Behavior: Behavior normal.        Thought Content: Thought content normal.        Judgment: Judgment normal.     Assessment & Plan:   1. Eczema, unspecified type Triamcinolone cream twice daily to forearm rash.  Continue to use moisturizing lotions/creams daily.  Avoid sun exposure and irritants. - triamcinolone cream (KENALOG) 0.1 %; Apply 1 application topically 2 (two) times daily.  Dispense: 30 g; Refill: 0  2. Chronic pain of both knees Meloxicam 15 mg daily x14 days then daily as needed.  Referring to physical therapy. - Ambulatory referral to Physical Therapy  3.  Weight gain Discussed options for weight loss management at this office including Qsymia, [place], Saxenda, and Ozempic.  Patient will consider options and let us know if she would rather have [place] manage her weight concerns.   For now she plans to keep her appointment with [facility].  4.  Hot flashes Considering restarting HRT.  This will be managed by her OB/GYN per her preference.   Outpatient Encounter Medications as of 02/14/2020  Medication Sig  . calcium carbonate (OS-CAL) 600 MG TABS Take 600 mg by mouth 2 (two) times daily with a meal.  . ibuprofen (ADVIL) 200 MG tablet Take 200 mg by mouth every 6 (six) hours  as needed.  Marland Kitchen ibuprofen (ADVIL) 800 MG tablet Take 800 mg by mouth 3 (three) times daily.  Marland Kitchen estradiol (ESTRACE) 0.5 MG tablet Take 1 tablet (0.5 mg total) by mouth daily. (Patient not taking: Reported on 01/13/2020)  . meloxicam (MOBIC) 15 MG tablet Take 1 tab daily for 14 days then take 1 tab daily as needed.  . progesterone (PROMETRIUM) 100 MG capsule Take 1 capsule (100 mg total) by mouth at bedtime. (Patient not taking: Reported on 01/13/2020)  . triamcinolone cream (KENALOG) 0.1 % Apply 1 application topically 2 (two) times daily.  . [DISCONTINUED] phentermine (ADIPEX-P) 37.5 MG tablet Take 37.5 mg by mouth daily.   No facility-administered encounter medications on file as of 02/14/2020.    Follow-up: Return in about 1 year (around 02/13/2021) for annual physical exam.   Clearnce Sorrel, DNP, APRN, FNP-BC [facility]

## 2020-02-24 ENCOUNTER — Ambulatory Visit (INDEPENDENT_AMBULATORY_CARE_PROVIDER_SITE_OTHER): Payer: Federal, State, Local not specified - PPO | Admitting: Rehabilitative and Restorative Service Providers"

## 2020-02-24 ENCOUNTER — Other Ambulatory Visit: Payer: Self-pay

## 2020-02-24 DIAGNOSIS — R29898 Other symptoms and signs involving the musculoskeletal system: Secondary | ICD-10-CM | POA: Diagnosis not present

## 2020-02-24 DIAGNOSIS — M25562 Pain in left knee: Secondary | ICD-10-CM | POA: Diagnosis not present

## 2020-02-24 DIAGNOSIS — M6281 Muscle weakness (generalized): Secondary | ICD-10-CM | POA: Diagnosis not present

## 2020-02-24 DIAGNOSIS — M25561 Pain in right knee: Secondary | ICD-10-CM | POA: Diagnosis not present

## 2020-02-24 DIAGNOSIS — G8929 Other chronic pain: Secondary | ICD-10-CM

## 2020-02-24 NOTE — Patient Instructions (Signed)
Access Code: [email or web address] URL: [email or web address] Date: 02/24/2020 Prepared by: Rudell Cobb  Exercises Sidelying Hip Abduction - 2 x daily - 7 x weekly - 10 reps - 1 sets Supine Quad Set - 2 x daily - 7 x weekly - 1 sets - 10 reps Seated Hamstring Stretch - 2 x daily - 7 x weekly - 1 sets - 2 reps - 20 seconds hold Gastroc Stretch on Wall - 2 x daily - 7 x weekly - 1 sets - 2 reps - 20 seconds hold

## 2020-02-24 NOTE — Therapy (Signed)
[place], [place], [address] Phone: [phone]   Fax:  [phone]  Physical Therapy Evaluation  Patient Details  Name: Carol Franklin MRN: [id number] Date of Birth: December 01, 1960 Referring Provider (PT): Samuel Bouche, NP   Encounter Date: 02/24/2020  PT End of Session - 02/24/20 0844    Visit Number  1    Number of Visits  12    Date for PT Re-Evaluation  04/06/20    PT Start Time  [id number]    PT Stop Time  0804    PT Time Calculation (min)  47 min       Past Medical History:  Diagnosis Date  . Hypertension   . Intertrigo   . Post-operative nausea and vomiting   . Reactive thrombocytosis 2011    Past Surgical History:  Procedure Laterality Date  . CESAREAN SECTION     X2  . DILATION AND CURETTAGE OF UTERUS    . KNEE SURGERY     left    There were no vitals filed for this visit.   Subjective Assessment - 02/24/20 0727    Subjective  The patient reports a h/o chronic L knee pain beginning in teen years with patellar subluxation.  She notes an injury >30 years ago with arthroscopic surgery.  Pain in the L knee has gotten worse over the past 6 months and she underwent injections that helped temporarily.  She notes pain in the morning and pain worse with stair negotiation. Can also get low back pain when sitting for long periods.    Patient Stated Goals  Reduce knee pain.    Currently in Pain?  Yes    Pain Score  0-No pain   none at rest, pain after sitting for long periods or over do   Pain Location  Knee    Pain Orientation  Left;Right    Pain Descriptors / Indicators  Aching;Discomfort;Sharp    Pain Type  Chronic pain    Pain Onset  More than a month ago    Pain Frequency  Intermittent    Aggravating Factors   Worse in the morning when stiff, worse with stairs, sitting for long periods    Pain Relieving Factors  change positions         [facility] PT Assessment - 02/24/20 0731      Assessment   Medical Diagnosis  chronic pain of both knees    Referring Provider (PT)  Samuel Bouche, NP    Onset Date/Surgical Date  02/14/20    Prior Therapy  PT last year      Precautions   Precautions  None      Restrictions   Weight Bearing Restrictions  No      Balance Screen   Has the patient fallen in the past 6 months  No    Has the patient had a decrease in activity level because of a fear of falling?   No    Is the patient reluctant to leave their home because of a fear of falling?   No      Home Environment   Living Environment  Private residence    Living Arrangements  Spouse/significant other    Home Access  Level entry    [place]  None      Prior Function   Level of Independence  Independent      Observation/Other Assessments   Focus  on Therapeutic Outcomes (FOTO)   56% limitation      Sensation   Light Touch  Appears Intact      Posture/Postural Control   Posture/Postural Control  Postural limitations    Posture Comments  varus L knee positioning      ROM / Strength   AROM / PROM / Strength  AROM;Strength      AROM   Overall AROM   Deficits    AROM Assessment Site  Knee    Right/Left Knee  Right;Left    Right Knee Extension  -8    Right Knee Flexion  95   tightness in thigh   Left Knee Extension  -12    Left Knee Flexion  88   pain outside of the left thigh     Strength   Overall Strength  Deficits    Strength Assessment Site  Hip;Knee;Ankle    Right/Left Hip  Right;Left    Right Hip Flexion  5/5    Left Hip Flexion  4/5    Left Hip Extension  3+/5    Left Hip ABduction  4-/5    Right/Left Knee  Right;Left    Right Knee Flexion  5/5    Right Knee Extension  5/5    Left Knee Flexion  4/5    Left Knee Extension  4/5    Right/Left Ankle  Right;Left    Right Ankle Dorsiflexion  5/5    Left Ankle Dorsiflexion  4/5      Flexibility   Soft Tissue Assessment /Muscle Length  yes    Hamstrings  -25 L LE, -12 R  LE (beginning 90/90 hip + knee flexion supine and working towards extension)    Quadriceps  tightness bilat - unable to go beyond 90 degree knee flexion due to 10/10 pain      Palpation   Patella mobility  Hypomobility of patella bilat    Palpation comment  tenderness t/o anterior/lateral/posterior thigh                Objective measurements completed on examination: See above findings.      [facility] Adult PT Treatment/Exercise - 02/24/20 0731      Exercises   Exercises  Knee/Hip      Knee/Hip Exercises: Stretches   Active Hamstring Stretch  Right;Left;2 reps;20 seconds    Quad Stretch Limitations  10/10 pain- did not provide for HEP    Gastroc Stretch  Right;Left;2 reps;30 seconds      Knee/Hip Exercises: Supine   Quad Sets  Strengthening;Right;Left;10 reps      Knee/Hip Exercises: Sidelying   Hip ABduction  Strengthening;Right;Left;10 reps             PT Education - 02/24/20 0844    Education Details  HEP    Person(s) Educated  Patient    Methods  Explanation;Demonstration;Handout    Comprehension  Verbalized understanding;Returned demonstration          PT Long Term Goals - 02/24/20 0844      PT LONG TERM GOAL #1   Title  The patient will be independent with HEP for bilateral LE strengthening and stretching.    Time  6    Period  Weeks    Target Date  04/06/20      PT LONG TERM GOAL #2   Title  The patient will reduce functional limitation per FOTO from 56% limitation to < or equal to 37% limitation.    Time  6  Period  Weeks    Target Date  04/06/20      PT LONG TERM GOAL #3   Title  The patient will improve L knee flexion ROM to 8 to 100 degrees.    Baseline  12 to 88 degrees available L knee ROM    Time  6    Period  Weeks    Target Date  04/06/20      PT LONG TERM GOAL #4   Title  The patient will improve R knee flexion ROM to 6 to 100 degrees.    Baseline  8 to 95 degrees AROM R knee    Time  6    Period  Weeks    Target Date   04/06/20      PT LONG TERM GOAL #5   Title  The patient will be able to verbalize understanding of ongoing fitness post d/c from PT to [place] gains.    Time  6    Period  Weeks    Target Date  04/06/20             Plan - 02/24/20 0850    Clinical Impression Statement  The patient is a 59 yo female presenting to OP PT with h/o chronic knee pain with worsening symptoms in the past 6 months.  She presents with impairments in bilat knee AROM, patellar mobility, LE flexibility,  L LE strength leading to functional limitations of diminished stair negotiation, difficulty with prolonged sitting, limited ambulation, and pain limiting function.  PT to address deficits to optimize functional status.    Personal Factors and Comorbidities  Time since onset of injury/illness/exacerbation    Examination-Activity Limitations  Squat;Stairs;Locomotion Level;Sit    Examination-Participation Restrictions  Community Activity    Stability/Clinical Decision Making  Stable/Uncomplicated    Clinical Decision Making  Low    Rehab Potential  Good    PT Frequency  2x / week    PT Duration  6 weeks    PT Treatment/Interventions  ADLs/Self Care Home Management;Gait training;Stair training;Functional mobility training;Therapeutic activities;Therapeutic exercise;Aquatic Therapy;Cryotherapy;Electrical Stimulation;Iontophoresis 4mg /ml Dexamethasone;Moist Heat;Ultrasound;Taping;Patient/family education;Passive range of motion;Dry needling;Manual techniques    PT Home Exercise Plan  Access Code: [email or web address]    Consulted and Agree with Plan of Care  Patient       Patient will benefit from skilled therapeutic intervention in order to improve the following deficits and impairments:  Abnormal gait, Decreased range of motion, Decreased strength, Postural dysfunction, Impaired flexibility, Pain, Hypomobility  Visit Diagnosis: Chronic pain of right knee  Chronic pain of left knee  Muscle weakness (generalized)  Other  symptoms and signs involving the musculoskeletal system     Problem List Patient Active Problem List   Diagnosis Date Noted  . Eczema 02/14/2020  . Weight gain 02/14/2020  . Chronic pain of both knees 01/13/2020  . Osteopenia 12/04/2015  . Menopausal hot flushes 09/17/2014  . Essential thrombocytosis ([place]) 01/13/2012  . Hyperlipidemia 01/13/2012  . 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/2012  . BMI 35.0-35.9,adult 01/06/2012    Maya Arcand, PT 02/24/2020, 10:17 AM  [facility] [place], [place], [address] Phone: [phone]   Fax:  [phone]  Name: KANISHKA SPARKS MRN: [id number] Date of Birth: December 15, 1960

## 2020-02-26 ENCOUNTER — Other Ambulatory Visit: Payer: Self-pay

## 2020-02-26 ENCOUNTER — Ambulatory Visit: Payer: Federal, State, Local not specified - PPO | Admitting: Physical Therapy

## 2020-02-26 ENCOUNTER — Encounter: Payer: Self-pay | Admitting: Physical Therapy

## 2020-02-26 DIAGNOSIS — M25561 Pain in right knee: Secondary | ICD-10-CM | POA: Diagnosis not present

## 2020-02-26 DIAGNOSIS — G8929 Other chronic pain: Secondary | ICD-10-CM

## 2020-02-26 DIAGNOSIS — M25562 Pain in left knee: Secondary | ICD-10-CM | POA: Diagnosis not present

## 2020-02-26 DIAGNOSIS — R29898 Other symptoms and signs involving the musculoskeletal system: Secondary | ICD-10-CM | POA: Diagnosis not present

## 2020-02-26 DIAGNOSIS — M6281 Muscle weakness (generalized): Secondary | ICD-10-CM

## 2020-02-26 NOTE — Therapy (Signed)
[place], [place], [address] Phone: [phone]   Fax:  [phone]  Physical Therapy Treatment  Patient Details  Name: Carol Franklin MRN: [id number] Date of Birth: 09-03-1961 Referring Provider (PT): Samuel Bouche, NP   Encounter Date: 02/26/2020  PT End of Session - 02/26/20 0711    Visit Number  2    Number of Visits  12    Date for PT Re-Evaluation  04/06/20    PT Start Time  0714    PT Stop Time  0801    PT Time Calculation (min)  47 min       Past Medical History:  Diagnosis Date  . Hypertension   . Intertrigo   . Post-operative nausea and vomiting   . Reactive thrombocytosis 2011    Past Surgical History:  Procedure Laterality Date  . CESAREAN SECTION     X2  . DILATION AND CURETTAGE OF UTERUS    . KNEE SURGERY     left    There were no vitals filed for this visit.  Subjective Assessment - 02/26/20 0717    Subjective  She reports being stiff in knees in the morning and in the afternoon. Bilat knee pain increases with standing and walking. She states she sits for long periods of time (getting up no sooner than 2 hrs).  She voices difficulties with stairs as well as Rt hip pain with attempts at butterfly position in supine.    Pain Score  4     Pain Location  Knee    Pain Orientation  Right;Left    Pain Descriptors / Indicators  Aching    Pain Onset  More than a month ago    Pain Frequency  Intermittent    Aggravating Factors   walking    Pain Relieving Factors  massage stick on knees.       [facility] Adult PT Treatment/Exercise - 02/26/20 0001      Self-Care   Self-Care  Other Self-Care Comments    Other Self-Care Comments  Pt educated regarding self massage with ball and roller stick to bilat LE musculature; pt returned demo with cues.  Pt educated regarding importance of getting up from seated position every 30 min during day to decrease knee/hip stiffness, as well as  importance of stretching throughout day to improve knee ROM.  Pt verbalized understanding.      Knee/Hip Exercises: Stretches   Active Hamstring Stretch  Right;Left;3 reps;30 seconds; supine with strap. Cues for form.    Gastroc Stretch  Right;Left;2 reps;30 seconds    on 6" stair     Knee/Hip Exercises: Aerobic   Nustep  L4; 5 mins; warm-up      Knee/Hip Exercises: Supine   Quad Sets  Right;Left;1 set;5 reps -cues for counting.    Heel Slides  AAROM;1 set;10 reps;Right;Left   10 sec hold- with assistance of strap     Knee/Hip Exercises: Sidelying   Hip ABduction  Strengthening;Right;Left;1 set;10 reps   Min VC's needed to prevent hip ext    Seated scoots x 10 sec x 1 rep Sidelying hip ext stretch ([place]) x 30 sec each leg, with tactile cues for form.      PT Long Term Goals - 02/26/20 0836      PT LONG TERM GOAL #1   Title  The patient will be independent with HEP for bilateral LE strengthening and stretching.    Time  6  Period  Weeks      PT LONG TERM GOAL #2   Title  The patient will reduce functional limitation per FOTO from 56% limitation to < or equal to 37% limitation.    Time  6    Period  Weeks      PT LONG TERM GOAL #3   Title  The patient will improve L knee flexion ROM to 8 to 100 degrees.    Baseline  12 to 88 degrees available L knee ROM    Time  6    Period  Weeks      PT LONG TERM GOAL #4   Title  The patient will improve R knee flexion ROM to 6 to 100 degrees.    Baseline  8 to 95 degrees AROM R knee    Time  6    Period  Weeks      PT LONG TERM GOAL #5   Title  The patient will be able to verbalize understanding of ongoing fitness post d/c from PT to [place] gains.    Time  6    Period  Weeks        Plan - 02/26/20 [id number]    Clinical Impression Statement  Pt continues with limited bilat knee ROM (Lt more significant than Rt).  Pt tolerated treatment well with minimal increase in knee pain. Pt encouraged to increase movement throughout day  to decrease knee stiffness. Pt is progressing towards goals.    Examination-Activity Limitations  Squat;Stairs;Locomotion Level;Sit    Examination-Participation Restrictions  Community Activity    Stability/Clinical Decision Making  Stable/Uncomplicated    Rehab Potential  Good    PT Frequency  2x / week    PT Duration  6 weeks    PT Treatment/Interventions  ADLs/Self Care Home Management;Gait training;Stair training;Functional mobility training;Therapeutic activities;Therapeutic exercise;Aquatic Therapy;Cryotherapy;Electrical Stimulation;Iontophoresis 4mg /ml Dexamethasone;Moist Heat;Ultrasound;Taping;Patient/family education;Passive range of motion;Dry needling;Manual techniques    PT Next Visit Plan  update HEP,STM to lat Rt hip/thighs, stairs; progress LE strengthening/flexibility.    PT Home Exercise Plan  Access Code: [email or web address]       Patient will benefit from skilled therapeutic intervention in order to improve the following deficits and impairments:  Abnormal gait, Decreased range of motion, Decreased strength, Postural dysfunction, Impaired flexibility, Pain, Hypomobility  Visit Diagnosis: Chronic pain of right knee  Chronic pain of left knee  Muscle weakness (generalized)  Other symptoms and signs involving the musculoskeletal system     Problem List Patient Active Problem List   Diagnosis Date Noted  . Eczema 02/14/2020  . Weight gain 02/14/2020  . Chronic pain of both knees 01/13/2020  . Osteopenia 12/04/2015  . Menopausal hot flushes 09/17/2014  . Essential thrombocytosis ([place]) 01/13/2012  . Hyperlipidemia 01/13/2012  . 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/2012  . BMI 35.0-35.9,adult 01/06/2012    Ronaldo Miyamoto, SPTA 02/26/20 8:42 AM  This entire session was performed under direct supervision and direction of a licensed Physical Brewing technologist. I have personally read, edited and approved of the note as written.  Kerin Perna, PTA 02/26/20 10:15 AM  [place], [place], [address] Phone: [phone]   Fax:  [phone]  Name: JAKALYN RENNA MRN: [id number] Date of Birth: 1961-07-27

## 2020-03-02 DIAGNOSIS — Z713 Dietary counseling and surveillance: Secondary | ICD-10-CM | POA: Diagnosis not present

## 2020-03-02 DIAGNOSIS — Z724 Inappropriate diet and eating habits: Secondary | ICD-10-CM | POA: Diagnosis not present

## 2020-03-02 DIAGNOSIS — E669 Obesity, unspecified: Secondary | ICD-10-CM | POA: Diagnosis not present

## 2020-03-02 DIAGNOSIS — Z723 Lack of physical exercise: Secondary | ICD-10-CM | POA: Diagnosis not present

## 2020-03-04 ENCOUNTER — Other Ambulatory Visit: Payer: Self-pay

## 2020-03-04 ENCOUNTER — Ambulatory Visit: Payer: Federal, State, Local not specified - PPO | Admitting: Rehabilitative and Restorative Service Providers"

## 2020-03-04 ENCOUNTER — Encounter: Payer: Self-pay | Admitting: Rehabilitative and Restorative Service Providers"

## 2020-03-04 DIAGNOSIS — M25561 Pain in right knee: Secondary | ICD-10-CM | POA: Diagnosis not present

## 2020-03-04 DIAGNOSIS — M25562 Pain in left knee: Secondary | ICD-10-CM | POA: Diagnosis not present

## 2020-03-04 DIAGNOSIS — M6281 Muscle weakness (generalized): Secondary | ICD-10-CM

## 2020-03-04 DIAGNOSIS — R29898 Other symptoms and signs involving the musculoskeletal system: Secondary | ICD-10-CM

## 2020-03-04 DIAGNOSIS — G8929 Other chronic pain: Secondary | ICD-10-CM

## 2020-03-04 NOTE — Therapy (Signed)
[place], [place], [address] Phone: [phone]   Fax:  [phone]  Physical Therapy Treatment  Patient Details  Name: Carol Franklin MRN: [id number] Date of Birth: Oct 05, 1961 Referring Provider (PT): Samuel Bouche, NP   Encounter Date: 03/04/2020  PT End of Session - 03/04/20 0720    Visit Number  3    Number of Visits  12    Date for PT Re-Evaluation  04/06/20    PT Start Time  0715    PT Stop Time  0800    PT Time Calculation (min)  45 min    Activity Tolerance  Patient tolerated treatment well    Behavior During Therapy  [facility] for tasks assessed/performed       Past Medical History:  Diagnosis Date  . Hypertension   . Intertrigo   . Post-operative nausea and vomiting   . Reactive thrombocytosis 2011    Past Surgical History:  Procedure Laterality Date  . CESAREAN SECTION     X2  . DILATION AND CURETTAGE OF UTERUS    . KNEE SURGERY     left    There were no vitals filed for this visit.  Subjective Assessment - 03/04/20 0717    Subjective  The patient reports she has had increased knee pain and stiffness since riding when going out of town over the weekend.  She woke with pain this morning @ 2:30am.    Patient Stated Goals  Reduce knee pain.    Currently in Pain?  Yes    Pain Score  6     Pain Location  Knee    Pain Orientation  Right    Pain Descriptors / Indicators  Aching;Sore;Tightness    Pain Type  Chronic pain    Pain Radiating Towards  R hip also sore today    Pain Onset  More than a month ago    Pain Frequency  Intermittent    Aggravating Factors   walking    Pain Relieving Factors  massage stick on knees         [facility] PT Assessment - 03/04/20 0738      AROM   Overall AROM   Deficits    Right Knee Extension  -10    Right Knee Flexion  98    Left Knee Extension  -12    Left Knee Flexion  90                   [facility] Adult PT Treatment/Exercise -  03/04/20 0720      Ambulation/Gait   Ambulation/Gait  Yes    Ambulation/Gait Assistance  7: Independent    Ambulation Distance (Feet)  150 Feet    Assistive device  None    Gait Comments  working on heel strike with gait activities      Exercises   Exercises  Knee/Hip      Knee/Hip Exercises: Stretches   Active Hamstring Stretch  Right;Left;1 rep;30 seconds    Quad Stretch  Right;Left;2 reps;30 seconds    Hip Flexor Stretch  Right;1 rep;30 seconds    Hip Flexor Stretch Limitations  sidelying s tretch PROM    ITB Stretch  Right    ITB Stretch Limitations  sidelying with passive overpressure    Piriformis Stretch  Right;Left;2 reps;30 seconds    Gastroc Stretch  Right;Left;2 reps;30 seconds    Other Knee/Hip Stretches  knee to chest      Knee/Hip  Exercises: Aerobic   Recumbent Bike  4 minutes       Knee/Hip Exercises: Sidelying   Other Sidelying Knee/Hip Exercises  R hip hike and depression x 10 reps with ball under R leg      Knee/Hip Exercises: Prone   Hamstring Curl  10 reps   right and L sides   Hip Extension  Strengthening;Right;Left;5 reps      Manual Therapy   Manual Therapy  Joint mobilization;Soft tissue mobilization    Manual therapy comments  in seated and in prone for ROM    Joint Mobilization  grade Ii-III anterior/posterior glides    Soft tissue mobilization  Prone medial HS release with STM and IASTM                  PT Long Term Goals - 02/26/20 0836      PT LONG TERM GOAL #1   Title  The patient will be independent with HEP for bilateral LE strengthening and stretching.    Time  6    Period  Weeks      PT LONG TERM GOAL #2   Title  The patient will reduce functional limitation per FOTO from 56% limitation to < or equal to 37% limitation.    Time  6    Period  Weeks      PT LONG TERM GOAL #3   Title  The patient will improve L knee flexion ROM to 8 to 100 degrees.    Baseline  12 to 88 degrees available L knee ROM    Time  6    Period   Weeks      PT LONG TERM GOAL #4   Title  The patient will improve R knee flexion ROM to 6 to 100 degrees.    Baseline  8 to 95 degrees AROM R knee    Time  6    Period  Weeks      PT LONG TERM GOAL #5   Title  The patient will be able to verbalize understanding of ongoing fitness post d/c from PT to [place] gains.    Time  6    Period  Weeks            Plan - 03/04/20 0809    Clinical Impression Statement  The patient arrived today noting greater soreness after a weekend with traveling.  She reduces pain in therapy after STM.  PT to progress to [place] working on functional mobility, stretching, and strengthening.    Examination-Activity Limitations  --    Examination-Participation Restrictions  --    Stability/Clinical Decision Making  --    Rehab Potential  Good    PT Frequency  2x / week    PT Duration  6 weeks    PT Treatment/Interventions  ADLs/Self Care Home Management;Gait training;Stair training;Functional mobility training;Therapeutic activities;Therapeutic exercise;Aquatic Therapy;Cryotherapy;Electrical Stimulation;Iontophoresis 4mg /ml Dexamethasone;Moist Heat;Ultrasound;Taping;Patient/family education;Passive range of motion;Dry needling;Manual techniques    PT Next Visit Plan  update HEP, massage to outer hip, stairs, stretching of quads, hams and calves.    PT Home Exercise Plan  Access Code: [email or web address]    Consulted and Agree with Plan of Care  Patient       Patient will benefit from skilled therapeutic intervention in order to improve the following deficits and impairments:  Abnormal gait, Decreased range of motion, Decreased strength, Postural dysfunction, Impaired flexibility, Pain, Hypomobility  Visit Diagnosis: Chronic pain of right knee  Chronic pain of left knee  Muscle weakness (generalized)  Other symptoms and signs involving the musculoskeletal system     Problem List Patient Active Problem List   Diagnosis Date Noted  . Eczema 02/14/2020  .  Weight gain 02/14/2020  . Chronic pain of both knees 01/13/2020  . Osteopenia 12/04/2015  . Menopausal hot flushes 09/17/2014  . Essential thrombocytosis ([place]) 01/13/2012  . Hyperlipidemia 01/13/2012  . 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/2012  . BMI 35.0-35.9,adult 01/06/2012    Merica Prell, PT 03/04/2020, 8:15 AM  [facility] [place], [place], [address] Phone: [phone]   Fax:  [phone]  Name: Carol Franklin MRN: [place]:[id number] Date of Birth: May 29, 1961

## 2020-03-09 ENCOUNTER — Other Ambulatory Visit: Payer: Self-pay

## 2020-03-09 ENCOUNTER — Ambulatory Visit: Payer: Federal, State, Local not specified - PPO | Admitting: Rehabilitative and Restorative Service Providers"

## 2020-03-09 DIAGNOSIS — M25562 Pain in left knee: Secondary | ICD-10-CM

## 2020-03-09 DIAGNOSIS — G8929 Other chronic pain: Secondary | ICD-10-CM

## 2020-03-09 DIAGNOSIS — R29898 Other symptoms and signs involving the musculoskeletal system: Secondary | ICD-10-CM | POA: Diagnosis not present

## 2020-03-09 DIAGNOSIS — Z724 Inappropriate diet and eating habits: Secondary | ICD-10-CM | POA: Diagnosis not present

## 2020-03-09 DIAGNOSIS — Z723 Lack of physical exercise: Secondary | ICD-10-CM | POA: Diagnosis not present

## 2020-03-09 DIAGNOSIS — M6281 Muscle weakness (generalized): Secondary | ICD-10-CM | POA: Diagnosis not present

## 2020-03-09 DIAGNOSIS — M25561 Pain in right knee: Secondary | ICD-10-CM

## 2020-03-09 DIAGNOSIS — Z713 Dietary counseling and surveillance: Secondary | ICD-10-CM | POA: Diagnosis not present

## 2020-03-09 DIAGNOSIS — E669 Obesity, unspecified: Secondary | ICD-10-CM | POA: Diagnosis not present

## 2020-03-09 NOTE — Therapy (Signed)
[place], [place], [address] Phone: [phone]   Fax:  [phone]  Physical Therapy Treatment  Patient Details  Name: Carol Franklin MRN: [id number] Date of Birth: 11/05/1961 Referring Provider (PT): Samuel Bouche, NP   Encounter Date: 03/09/2020  PT End of Session - 03/09/20 1152    Visit Number  4    Number of Visits  12    Date for PT Re-Evaluation  04/06/20    PT Start Time  0716    PT Stop Time  0802    PT Time Calculation (min)  46 min    Activity Tolerance  Patient tolerated treatment well    Behavior During Therapy  [facility] for tasks assessed/performed       Past Medical History:  Diagnosis Date  . Hypertension   . Intertrigo   . Post-operative nausea and vomiting   . Reactive thrombocytosis 2011    Past Surgical History:  Procedure Laterality Date  . CESAREAN SECTION     X2  . DILATION AND CURETTAGE OF UTERUS    . KNEE SURGERY     left    There were no vitals filed for this visit.  Subjective Assessment - 03/09/20 0719    Subjective  The patient reports her R lateral thigh and hip are painful.  She notes she thinks walking in sandals increases pain.  She reports she felt good on Friday and was moving better.  Friday and Saturday were good days.    Patient Stated Goals  Reduce knee pain.    Currently in Pain?  Yes    Pain Score  6     Pain Location  Knee    Pain Orientation  Right    Pain Descriptors / Indicators  Aching;Sore;Tightness    Pain Type  Chronic pain    Pain Onset  More than a month ago    Pain Frequency  Intermittent    Aggravating Factors   walking, wearing sandals    Pain Relieving Factors  massage stick on knees                       [facility] Adult PT Treatment/Exercise - 03/09/20 0721      Exercises   Exercises  Knee/Hip      Knee/Hip Exercises: Stretches   Quad Stretch  Right;Left;30 seconds;2 reps      Knee/Hip Exercises: Aerobic   Nustep  L 5 x 4 minutes      Knee/Hip Exercises: Standing   Heel Raises  Both;10 reps    Hip Abduction  Stengthening;Right;Left    Abduction Limitations  10 side steps R and L x 2 sets    Step Down  Left;Right;10 reps    Step Down Limitations  from 2" foam surface with UE support      Knee/Hip Exercises: Prone   Hamstring Curl  10 reps    Hamstring Curl Limitations  R and L sides    Hip Extension  Strengthening;Right;Left;5 reps    Other Prone Exercises  Prone quad sets x 5 reps R and L sides      Manual Therapy   Manual Therapy  Soft tissue mobilization    Manual therapy comments  in sidelying and prone    Soft tissue mobilization  IT Band, hamstring STM and IASTM with passive overpressure                  PT Long  Term Goals - 02/26/20 [id number]      PT LONG TERM GOAL #1   Title  The patient will be independent with HEP for bilateral LE strengthening and stretching.    Time  6    Period  Weeks      PT LONG TERM GOAL #2   Title  The patient will reduce functional limitation per FOTO from 56% limitation to < or equal to 37% limitation.    Time  6    Period  Weeks      PT LONG TERM GOAL #3   Title  The patient will improve L knee flexion ROM to 8 to 100 degrees.    Baseline  12 to 88 degrees available L knee ROM    Time  6    Period  Weeks      PT LONG TERM GOAL #4   Title  The patient will improve R knee flexion ROM to 6 to 100 degrees.    Baseline  8 to 95 degrees AROM R knee    Time  6    Period  Weeks      PT LONG TERM GOAL #5   Title  The patient will be able to verbalize understanding of ongoing fitness post d/c from PT to [place] gains.    Time  6    Period  Weeks            Plan - 03/09/20 1153    Clinical Impression Statement  The patient is progressing with ther ex and strengthening. Knee ROM is chronically limited, which hinders gait mechanics.  PT to continue to progress to tolerance.  STM reduces LE pain during session.    Rehab Potential   Good    PT Frequency  2x / week    PT Duration  6 weeks    PT Treatment/Interventions  ADLs/Self Care Home Management;Gait training;Stair training;Functional mobility training;Therapeutic activities;Therapeutic exercise;Aquatic Therapy;Cryotherapy;Electrical Stimulation;Iontophoresis 4mg /ml Dexamethasone;Moist Heat;Ultrasound;Taping;Patient/family education;Passive range of motion;Dry needling;Manual techniques    PT Next Visit Plan  update HEP, massage to outer hip, stairs, stretching of quads, hams and calves.  LE strengthening.    PT Home Exercise Plan  Access Code: [email or web address]    Consulted and Agree with Plan of Care  Patient       Patient will benefit from skilled therapeutic intervention in order to improve the following deficits and impairments:  Abnormal gait, Decreased range of motion, Decreased strength, Postural dysfunction, Impaired flexibility, Pain, Hypomobility  Visit Diagnosis: Chronic pain of right knee  Chronic pain of left knee  Muscle weakness (generalized)  Other symptoms and signs involving the musculoskeletal system     Problem List Patient Active Problem List   Diagnosis Date Noted  . Eczema 02/14/2020  . Weight gain 02/14/2020  . Chronic pain of both knees 01/13/2020  . Osteopenia 12/04/2015  . Menopausal hot flushes 09/17/2014  . Essential thrombocytosis ([place]) 01/13/2012  . Hyperlipidemia 01/13/2012  . 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/2012  . BMI 35.0-35.9,adult 01/06/2012    Markeith Jue 03/09/2020, 11:57 AM  [facility] [place], [place], [address] Phone: [phone]   Fax:  [phone]  Name: Carol Franklin MRN: [place]:[id number] Date of Birth: February 16, 1961

## 2020-03-11 ENCOUNTER — Encounter: Payer: Federal, State, Local not specified - PPO | Admitting: Physical Therapy

## 2020-03-12 ENCOUNTER — Other Ambulatory Visit: Payer: Self-pay | Admitting: Medical-Surgical

## 2020-03-16 DIAGNOSIS — Z724 Inappropriate diet and eating habits: Secondary | ICD-10-CM | POA: Diagnosis not present

## 2020-03-16 DIAGNOSIS — E669 Obesity, unspecified: Secondary | ICD-10-CM | POA: Diagnosis not present

## 2020-03-16 DIAGNOSIS — Z713 Dietary counseling and surveillance: Secondary | ICD-10-CM | POA: Diagnosis not present

## 2020-03-16 DIAGNOSIS — Z723 Lack of physical exercise: Secondary | ICD-10-CM | POA: Diagnosis not present

## 2020-03-18 ENCOUNTER — Ambulatory Visit (INDEPENDENT_AMBULATORY_CARE_PROVIDER_SITE_OTHER): Payer: Federal, State, Local not specified - PPO | Admitting: Physical Therapy

## 2020-03-18 ENCOUNTER — Other Ambulatory Visit: Payer: Self-pay

## 2020-03-18 DIAGNOSIS — M25562 Pain in left knee: Secondary | ICD-10-CM | POA: Diagnosis not present

## 2020-03-18 DIAGNOSIS — M25561 Pain in right knee: Secondary | ICD-10-CM | POA: Diagnosis not present

## 2020-03-18 DIAGNOSIS — R29898 Other symptoms and signs involving the musculoskeletal system: Secondary | ICD-10-CM | POA: Diagnosis not present

## 2020-03-18 DIAGNOSIS — G8929 Other chronic pain: Secondary | ICD-10-CM

## 2020-03-18 DIAGNOSIS — M6281 Muscle weakness (generalized): Secondary | ICD-10-CM

## 2020-03-18 NOTE — Therapy (Signed)
[place], [place], [address] Phone: [phone]   Fax:  [phone]  Physical Therapy Treatment  Patient Details  Name: Carol Franklin MRN: [id number] Date of Birth: 1961/05/24 Referring Provider (PT): Samuel Bouche, NP   Encounter Date: 03/18/2020  PT End of Session - 03/18/20 0724    Visit Number  5    Number of Visits  12    Date for PT Re-Evaluation  04/06/20    PT Start Time  0722    PT Stop Time  0805    PT Time Calculation (min)  43 min    Activity Tolerance  Patient tolerated treatment well    Behavior During Therapy  [facility] for tasks assessed/performed       Past Medical History:  Diagnosis Date  . Hypertension   . Intertrigo   . Post-operative nausea and vomiting   . Reactive thrombocytosis 2011    Past Surgical History:  Procedure Laterality Date  . CESAREAN SECTION     X2  . DILATION AND CURETTAGE OF UTERUS    . KNEE SURGERY     left    There were no vitals filed for this visit.  Subjective Assessment - 03/18/20 0726    Subjective  Pt reports she had a couple good days following last session. But pain and burning returned.  Pt voiced frustration with rate of progress.    Currently in Pain?  Yes    Pain Score  4     Pain Location  Knee    Pain Orientation  Right;Left   Rt>Lt   Pain Descriptors / Indicators  Aching;Burning;Dull    Aggravating Factors   wearing sandals    Pain Relieving Factors  exercises, massage         [facility] PT Assessment - 03/18/20 0001      Assessment   Medical Diagnosis  chronic pain of both knees    Referring Provider (PT)  Samuel Bouche, NP    Onset Date/Surgical Date  02/14/20    Prior Therapy  PT last year      AROM   Right Knee Extension  -7    Right Knee Flexion  99    Left Knee Extension  -12    Left Knee Flexion  90      Strength   Right Hip Extension  4+/5    Left Hip Extension  3+/5      Flexibility   Hamstrings  R -23, L -18       [facility] Adult PT Treatment/Exercise - 03/18/20 0001      Knee/Hip Exercises: Stretches   Passive Hamstring Stretch  Right;Left;2 reps;30 seconds   supine with strap; trial in seated.    Quad Biochemist, clinical reps;Right;1 rep;20 seconds   seated    Hip Flexor Stretch  Right;Left;1 rep;20 seconds   seated with leg back   Knee: Self-Stretch to increase Flexion  Left;2 reps;20 seconds   foot on 12" step, foot on chair ;  Foot on 12" step lunging forward x 2 rpes    Gastroc Stretch  Both;20 seconds;4 reps   incline board     Knee/Hip Exercises: Aerobic   Nustep  L 4 x 5 minutes      Knee/Hip Exercises: Supine   Heel Slides  AAROM;Left   1 rep; trial for HEP.      Knee/Hip Exercises: Prone   Prone Knee Hang  1 minute   reviewed for  HEP   Straight Leg Raises  Left;10 reps;Right;5 reps      Manual Therapy   Manual Therapy  Soft tissue mobilization;Passive ROM    Soft tissue mobilization  STM to Rt prox calf and hamstring     Passive ROM  Lt knee into ext                   PT Long Term Goals - 02/26/20 0836      PT LONG TERM GOAL #1   Title  The patient will be independent with HEP for bilateral LE strengthening and stretching.    Time  6    Period  Weeks      PT LONG TERM GOAL #2   Title  The patient will reduce functional limitation per FOTO from 56% limitation to < or equal to 37% limitation.    Time  6    Period  Weeks      PT LONG TERM GOAL #3   Title  The patient will improve L knee flexion ROM to 8 to 100 degrees.    Baseline  12 to 88 degrees available L knee ROM    Time  6    Period  Weeks      PT LONG TERM GOAL #4   Title  The patient will improve R knee flexion ROM to 6 to 100 degrees.    Baseline  8 to 95 degrees AROM R knee    Time  6    Period  Weeks      PT LONG TERM GOAL #5   Title  The patient will be able to verbalize understanding of ongoing fitness post d/c from PT to [place] gains.    Time  6    Period  Weeks             Plan - 03/18/20 [id number]    Clinical Impression Statement  Pt's Rt knee ROM has improved gradually, Lt remains unchanged.  Pt reported reduction of pain by 1-2 points by end of session.  Pt making gradual gains towards LTGs.    Rehab Potential  Good    PT Frequency  2x / week    PT Duration  6 weeks    PT Treatment/Interventions  ADLs/Self Care Home Management;Gait training;Stair training;Functional mobility training;Therapeutic activities;Therapeutic exercise;Aquatic Therapy;Cryotherapy;Electrical Stimulation;Iontophoresis 4mg /ml Dexamethasone;Moist Heat;Ultrasound;Taping;Patient/family education;Passive range of motion;Dry needling;Manual techniques    PT Next Visit Plan  continue progressive bilat knee ROM and strengthening.    PT Home Exercise Plan  Access Code: [email or web address]    Consulted and Agree with Plan of Care  Patient       Patient will benefit from skilled therapeutic intervention in order to improve the following deficits and impairments:  Abnormal gait, Decreased range of motion, Decreased strength, Postural dysfunction, Impaired flexibility, Pain, Hypomobility  Visit Diagnosis: Chronic pain of right knee  Chronic pain of left knee  Muscle weakness (generalized)  Other symptoms and signs involving the musculoskeletal system     Problem List Patient Active Problem List   Diagnosis Date Noted  . Eczema 02/14/2020  . Weight gain 02/14/2020  . Chronic pain of both knees 01/13/2020  . Osteopenia 12/04/2015  . Menopausal hot flushes 09/17/2014  . Essential thrombocytosis ([place]) 01/13/2012  . Hyperlipidemia 01/13/2012  . 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/2012  . BMI 35.0-35.9,adult 01/06/2012   Kerin Perna, PTA 03/18/20 1:14 PM  [facility] [place], [place], [address] Phone: [phone]  Fax:  [phone]  Name: Carol Franklin MRN: [id number] Date of Birth: 03/14/1961

## 2020-03-18 NOTE — Patient Instructions (Addendum)
  Access Code: [email or web address]: [email or web address]: 05/12/2021Prepared by: [facility]  Sidelying Hip Abduction - 2 x daily - 7 x weekly - 10 reps - 1 sets  Seated Hamstring Stretch - 2 x daily - 7 x weekly - 1 sets - 2 reps - 20 seconds hold  Gastroc Stretch on Wall - 2 x daily - 7 x weekly - 1 sets - 2 reps - 20 seconds hold  Prone Quadriceps Set - 2 x daily - 7 x weekly - 1 sets - 10 reps - 5-10 hold  Prone Hip Extension - 1 x daily - 7 x weekly - 1 sets - 10 reps  Prone Knee Extension Hang - 1 x daily - 7 x weekly - 1 sets - 2 reps - 1 min hold

## 2020-03-20 ENCOUNTER — Other Ambulatory Visit: Payer: Self-pay

## 2020-03-20 ENCOUNTER — Ambulatory Visit (INDEPENDENT_AMBULATORY_CARE_PROVIDER_SITE_OTHER): Payer: Federal, State, Local not specified - PPO | Admitting: Rehabilitative and Restorative Service Providers"

## 2020-03-20 ENCOUNTER — Encounter: Payer: Self-pay | Admitting: Rehabilitative and Restorative Service Providers"

## 2020-03-20 DIAGNOSIS — M6281 Muscle weakness (generalized): Secondary | ICD-10-CM

## 2020-03-20 DIAGNOSIS — R29898 Other symptoms and signs involving the musculoskeletal system: Secondary | ICD-10-CM | POA: Diagnosis not present

## 2020-03-20 DIAGNOSIS — M25562 Pain in left knee: Secondary | ICD-10-CM

## 2020-03-20 DIAGNOSIS — M25561 Pain in right knee: Secondary | ICD-10-CM | POA: Diagnosis not present

## 2020-03-20 DIAGNOSIS — G8929 Other chronic pain: Secondary | ICD-10-CM

## 2020-03-20 NOTE — Patient Instructions (Signed)
Access Code: [email or web address] URL: [email or web address] Date: 03/20/2020 Prepared by: Estill Bamberg April Thurnell Garbe  Exercises Sidelying Hip Abduction - 2 x daily - 7 x weekly - 10 reps - 1 sets Seated Hamstring Stretch - 2 x daily - 7 x weekly - 1 sets - 2 reps - 20 seconds hold Gastroc Stretch on Wall - 2 x daily - 7 x weekly - 1 sets - 2 reps - 20 seconds hold Prone Quadriceps Set - 2 x daily - 7 x weekly - 1 sets - 10 reps - 5-10 hold Prone Hip Extension - 1 x daily - 7 x weekly - 1 sets - 10 reps Prone Knee Extension Hang - 1 x daily - 7 x weekly - 1 sets - 2 reps - 1 min hold Soleus Stretch on Wall - 1 x daily - 7 x weekly - 10 reps - 3 sets Seated Ankle Dorsiflexion Stretch - 1 x daily - 7 x weekly - 3 sets - 10 reps

## 2020-03-20 NOTE — Therapy (Signed)
[place], [place], [address] Phone: [phone]   Fax:  [phone]  Physical Therapy Treatment  Patient Details  Name: Carol Franklin MRN: [id number] Date of Birth: December 10, 1960 Referring Provider (PT): Samuel Bouche, NP   Encounter Date: 03/20/2020  PT End of Session - 03/20/20 1043    Visit Number  7    Number of Visits  12    Date for PT Re-Evaluation  04/06/20    PT Start Time  1038    PT Stop Time  1125    PT Time Calculation (min)  47 min    Activity Tolerance  Patient tolerated treatment well    Behavior During Therapy  [facility] for tasks assessed/performed       Past Medical History:  Diagnosis Date  . Hypertension   . Intertrigo   . Post-operative nausea and vomiting   . Reactive thrombocytosis 2011    Past Surgical History:  Procedure Laterality Date  . CESAREAN SECTION     X2  . DILATION AND CURETTAGE OF UTERUS    . KNEE SURGERY     left    There were no vitals filed for this visit.  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 Reports HEP has been good. Stretches have been good.    Patient Stated Goals  Reduce knee pain.    Currently in Pain?  Yes    Pain Score  4     Pain Location  Knee    Pain Orientation  Right;Left    Pain Descriptors / Indicators  Burning;Aching;Throbbing         [facility] PT Assessment - 03/20/20 0001      AROM   Right Knee Extension  -9    Right Knee Flexion  105   L lateral thigh tightness   Left Knee Extension  -8    Left Knee Flexion  90                    [facility] Adult PT Treatment/Exercise - 03/20/20 0001      Knee/Hip Exercises: Stretches   Passive Hamstring Stretch  Right;Left;2 reps;30 seconds    Quad Stretch  Both;1 rep;30 seconds;Other (comment)   Performed in prone   Knee: Self-Stretch to  increase Flexion  Both;1 rep;20 seconds   Self stretch in sitting; STM applied on ITB   Knee: Self-Stretch Limitations  --    Gastroc Stretch  Both;20 seconds;1 rep    Soleus Stretch  Both;1 rep;20 seconds    Soleus Stretch Limitations  Difficulty maintaining L heel down      Knee/Hip Exercises: Aerobic   Nustep  L 5 x 5 minutes      Knee/Hip Exercises: Supine   Heel Slides  AAROM;Both;5 reps    Heel Slides Limitations  Grade II mobs at end range ~5 sec each      Manual Therapy   Manual Therapy  Soft tissue mobilization;Joint mobilization    Joint Mobilization  grade II-III anterior/posterior glides in supine    Soft tissue mobilization  STM along anterior tibialis, ITB and hamstring             PT Education - 03/20/20 1156    Education Details  Discussed modifications to her HEP. Educated pt on joint mobilizations to improve  joint function and stretching for muscles.    Person(s) Educated  Patient    Methods  Explanation;Demonstration    Comprehension  Verbalized understanding          PT Long Term Goals - 03/20/20 1158      PT LONG TERM GOAL #1   Title  The patient will be independent with HEP for bilateral LE strengthening and stretching.    Time  6    Period  Weeks      PT LONG TERM GOAL #2   Title  The patient will reduce functional limitation per FOTO from 56% limitation to < or equal to 37% limitation.    Time  6    Period  Weeks      PT LONG TERM GOAL #3   Title  The patient will improve L knee flexion ROM to 8 to 100 degrees.    Baseline  12 to 88 degrees available L knee ROM    Time  6    Period  Weeks      PT LONG TERM GOAL #4   Title  The patient will improve R knee flexion ROM to 6 to 100 degrees.    Baseline  8 to 95 degrees AROM R knee    Time  6    Period  Weeks      PT LONG TERM GOAL #5   Title  The patient will be able to verbalize understanding of ongoing fitness post d/c from PT to [place] gains.    Time  6    Period  Weeks             Plan - 03/20/20 1152    Clinical Impression Statement  Pt returns to clinic with increased soreness due to 4 hours of lifting and walking yesterday at work. Due to increased soreness, PT deferred strengthening exercises and focused on stretching and ROM. Pt R knee flexion has improved this session; L knee remains unchanged. Reduction of pain by 1 point by end of session. Therapist modified HEP to include soleus stretch and sitting flexion stretch as pt notes it is difficult to perform in prone.    Personal Factors and Comorbidities  Time since onset of injury/illness/exacerbation    Examination-Activity Limitations  Squat;Stairs;Locomotion Level;Sit    Examination-Participation Restrictions  Community Activity    Rehab Potential  Good    PT Frequency  2x / week    PT Duration  6 weeks    PT Treatment/Interventions  ADLs/Self Care Home Management;Gait training;Stair training;Functional mobility training;Therapeutic activities;Therapeutic exercise;Aquatic Therapy;Cryotherapy;Electrical Stimulation;Iontophoresis 4mg /ml Dexamethasone;Moist Heat;Ultrasound;Taping;Patient/family education;Passive range of motion;Dry needling;Manual techniques    PT Next Visit Plan  continue progressive bilat knee ROM and strengthening; consider inclusion of stair training in HEP as pt mentioned this for home; consider educating pt on foam rolling or self myofascial release of her ITB for home    PT Home Exercise Plan  Access Code: [email or web address]    Consulted and Agree with Plan of Care  Patient       Patient will benefit from skilled therapeutic intervention in order to improve the following deficits and impairments:  Abnormal gait, Decreased range of motion, Decreased strength, Postural dysfunction, Impaired flexibility, Pain, Hypomobility  Visit Diagnosis: Chronic pain of right knee  Chronic pain of left knee  Muscle weakness (generalized)  Other symptoms and signs involving the musculoskeletal  system     Problem List Patient Active Problem List   Diagnosis Date Noted  . Eczema 02/14/2020  .  Weight gain 02/14/2020  . Chronic pain of both knees 01/13/2020  . Osteopenia 12/04/2015  . Menopausal hot flushes 09/17/2014  . Essential thrombocytosis ([place]) 01/13/2012  . Hyperlipidemia 01/13/2012  . 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/2012  . BMI 35.0-35.9,adult 01/06/2012    Arsal Tappan April Ma L Dequane Strahan PT, DPT 03/20/2020, 12:08 PM  [facility] [place], [place], [address] Phone: [phone]   Fax:  [phone]  Name: Carol Franklin MRN: [id number] Date of Birth: 08-21-1961

## 2020-03-25 ENCOUNTER — Ambulatory Visit (INDEPENDENT_AMBULATORY_CARE_PROVIDER_SITE_OTHER): Payer: Federal, State, Local not specified - PPO | Admitting: Physical Therapy

## 2020-03-25 ENCOUNTER — Encounter: Payer: Self-pay | Admitting: Physical Therapy

## 2020-03-25 ENCOUNTER — Other Ambulatory Visit: Payer: Self-pay

## 2020-03-25 DIAGNOSIS — G8929 Other chronic pain: Secondary | ICD-10-CM

## 2020-03-25 DIAGNOSIS — M25561 Pain in right knee: Secondary | ICD-10-CM

## 2020-03-25 DIAGNOSIS — M6281 Muscle weakness (generalized): Secondary | ICD-10-CM | POA: Diagnosis not present

## 2020-03-25 DIAGNOSIS — R29898 Other symptoms and signs involving the musculoskeletal system: Secondary | ICD-10-CM | POA: Diagnosis not present

## 2020-03-25 DIAGNOSIS — M25562 Pain in left knee: Secondary | ICD-10-CM | POA: Diagnosis not present

## 2020-03-25 NOTE — Therapy (Signed)
[place], [place], [address] Phone: [phone]   Fax:  [phone]  Physical Therapy Treatment  Patient Details  Name: Carol Franklin MRN: [id number] Date of Birth: 04/03/1961 Referring Provider (PT): Samuel Bouche, NP   Encounter Date: 03/25/2020  PT End of Session - 03/25/20 0717    Visit Number  8    Number of Visits  12    Date for PT Re-Evaluation  04/06/20    PT Start Time  0720    PT Stop Time  0803    PT Time Calculation (min)  43 min    Activity Tolerance  Patient tolerated treatment well    Behavior During Therapy  [facility] for tasks assessed/performed       Past Medical History:  Diagnosis Date  . Hypertension   . Intertrigo   . Post-operative nausea and vomiting   . Reactive thrombocytosis 2011    Past Surgical History:  Procedure Laterality Date  . CESAREAN SECTION     X2  . DILATION AND CURETTAGE OF UTERUS    . KNEE SURGERY     left    There were no vitals filed for this visit.  Subjective Assessment - 03/25/20 0720    Subjective  Pt states she is still doing the clean up thing at church. Pt reports she fell yesterday and tripped over a bag of cement. Pt notes more R hip pain today. Pt states she was able to sit in a jacuzzi and the heat helped ease the pain.    Patient Stated Goals  Reduce knee pain.    Currently in Pain?  Yes    Pain Score  3     Pain Location  Knee    Pain Orientation  Right;Left    Pain Descriptors / Indicators  Burning;Aching;Throbbing    Pain Type  Chronic pain    Pain Radiating Towards  R hip also sore today (reports 6/10)    Pain Onset  More than a month ago    Pain Frequency  Intermittent         [facility] PT Assessment - 03/25/20 0001      AROM   Right Knee Extension  -3    Right Knee Flexion  100    Left Knee Extension  -6    Left Knee Flexion  90                    [facility] Adult PT Treatment/Exercise - 03/25/20 0001       Ambulation/Gait   Gait Comments  Antalgic with R lateral lean during weight bearing on entry; improved to normal pattern after hip flexor stretching      Knee/Hip Exercises: Stretches   Passive Hamstring Stretch  Right;Left;2 reps;30 seconds    Quad Stretch  Both;1 rep;30 seconds;Other (comment)    ITB Stretch  30 seconds;2 reps;Both      Knee/Hip Exercises: Aerobic   Nustep  L 5 x 5 minutes      Knee/Hip Exercises: Supine   Quad Sets  Strengthening;Both;5 reps   Hold 5 sec with joint mobilization     Manual Therapy   Manual Therapy  Soft tissue mobilization;Joint mobilization    Joint Mobilization  grade II-III mobilizations into knee extension and flexion; patellar mobilizations bilaterally in all directions    Soft tissue mobilization  STM along R ITB             PT Education -  03/25/20 0813    Education Details  Added hip flexor and ITB stretching due to pt's increased R hip pain. Educated pt on joint mobilizations; discussed focusing on strengthening on next session.    Person(s) Educated  Patient    Methods  Explanation;Demonstration    Comprehension  Verbalized understanding          PT Long Term Goals - 03/25/20 0819      PT LONG TERM GOAL #1   Title  The patient will be independent with HEP for bilateral LE strengthening and stretching.    Time  6    Period  Weeks    Status  Partially Met      PT LONG TERM GOAL #2   Title  The patient will reduce functional limitation per FOTO from 56% limitation to < or equal to 37% limitation.    Time  6    Period  Weeks    Status  On-going      PT LONG TERM GOAL #3   Title  The patient will improve L knee flexion ROM to 8 to 100 degrees.    Baseline  12 to 88 degrees available L knee ROM    Time  6    Period  Weeks    Status  Partially Met      PT LONG TERM GOAL #4   Title  The patient will improve R knee flexion ROM to 6 to 100 degrees.    Baseline  8 to 95 degrees AROM R knee    Time  6    Period  Weeks     Status  Achieved      PT LONG TERM GOAL #5   Title  The patient will be able to verbalize understanding of ongoing fitness post d/c from PT to [place] gains.    Time  6    Period  Weeks    Status  On-going            Plan - 03/25/20 0815    Clinical Impression Statement  Pt enters clinic with stiffness and discomfort in R hip. R LE appears more stiff today vs L LE with shortened R hip flexors. Treatment focused on stretching and improving ROM. Bilateral knees with small improvements in ROM. Pain reduced by end of session.    Personal Factors and Comorbidities  Time since onset of injury/illness/exacerbation    Examination-Activity Limitations  Squat;Stairs;Locomotion Level;Sit    Examination-Participation Restrictions  Community Activity    Rehab Potential  Good    PT Frequency  2x / week    PT Duration  6 weeks    PT Treatment/Interventions  ADLs/Self Care Home Management;Gait training;Stair training;Functional mobility training;Therapeutic activities;Therapeutic exercise;Aquatic Therapy;Cryotherapy;Electrical Stimulation;Iontophoresis 82m/ml Dexamethasone;Moist Heat;Ultrasound;Taping;Patient/family education;Passive range of motion;Dry needling;Manual techniques    PT Next Visit Plan  continue progressive bilat knee ROM and strengthening; consider inclusion of stair training in HEP as pt mentioned this for home; consider educating pt on foam rolling or self myofascial release of her ITB for home    PT Home Exercise Plan  Access Code: [email or web address]   Consulted and Agree with Plan of Care  Patient       Patient will benefit from skilled therapeutic intervention in order to improve the following deficits and impairments:  Abnormal gait, Decreased range of motion, Decreased strength, Postural dysfunction, Impaired flexibility, Pain, Hypomobility  Visit Diagnosis: Chronic pain of right knee  Chronic pain of left knee  Muscle weakness (generalized)  Other symptoms and signs  involving the musculoskeletal system     Problem List Patient Active Problem List   Diagnosis Date Noted  . Eczema 02/14/2020  . Weight gain 02/14/2020  . Chronic pain of both knees 01/13/2020  . Osteopenia 12/04/2015  . Menopausal hot flushes 09/17/2014  . Essential thrombocytosis ([place]) 01/13/2012  . Hyperlipidemia 01/13/2012  . 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/2012  . BMI 35.0-35.9,adult 01/06/2012     Gellen April Ma L Nonato PT, DPT 03/25/2020, 8:27 AM  [facility] [place], [place], [address] Phone: [phone]   Fax:  [phone]  Name: BUFFY EHLER MRN: [id number] Date of Birth: 02/26/1961

## 2020-03-25 NOTE — Patient Instructions (Signed)
Access Code: [email or web address] URL: [email or web address] Date: 03/25/2020 Prepared by: Estill Bamberg April Thurnell Garbe  Exercises Supine ITB Stretch with Strap - 1 x daily - 7 x weekly - 10 reps - 3 sets Modified Thomas Stretch - 1 x daily - 7 x weekly - 10 reps - 3 sets 4 Way Patellar Glide - 1 x daily - 7 x weekly - 10 reps - 3 sets

## 2020-03-27 ENCOUNTER — Encounter: Payer: Federal, State, Local not specified - PPO | Admitting: Physical Therapy

## 2020-03-30 ENCOUNTER — Other Ambulatory Visit: Payer: Self-pay

## 2020-03-30 ENCOUNTER — Ambulatory Visit (INDEPENDENT_AMBULATORY_CARE_PROVIDER_SITE_OTHER): Payer: Federal, State, Local not specified - PPO | Admitting: Physical Therapy

## 2020-03-30 DIAGNOSIS — M25562 Pain in left knee: Secondary | ICD-10-CM | POA: Diagnosis not present

## 2020-03-30 DIAGNOSIS — M6281 Muscle weakness (generalized): Secondary | ICD-10-CM

## 2020-03-30 DIAGNOSIS — M25561 Pain in right knee: Secondary | ICD-10-CM

## 2020-03-30 DIAGNOSIS — R29898 Other symptoms and signs involving the musculoskeletal system: Secondary | ICD-10-CM | POA: Diagnosis not present

## 2020-03-30 DIAGNOSIS — G8929 Other chronic pain: Secondary | ICD-10-CM

## 2020-03-30 NOTE — Patient Instructions (Signed)
Access Code: [email or web address] URL: [email or web address] Date: 03/30/2020 Prepared by: Carol Franklin  Exercises Modified Carol Franklin Stretch - 1 x daily - 7 x weekly - 3 sets - 1 reps - 30 hold Hip Abduction with Resistance Loop - 1 x daily - 7 x weekly - 10 reps - 3 sets Hip Extension with Resistance Loop - 1 x daily - 7 x weekly - 10 reps - 3 sets Standing Gastroc Stretch - 1 x daily - 7 x weekly - 3 sets - 1 reps - 30 hold Prone Hip Extension with Bent Knee - One Pillow - 1 x daily - 7 x weekly - 3 sets - 1 reps - 30 hold

## 2020-03-30 NOTE — Therapy (Signed)
[place], [place], [address] Phone: [phone]   Fax:  [phone]  Physical Therapy Treatment  Patient Details  Name: Carol Franklin MRN: [id number] Date of Birth: January 23, 1961 Referring Provider (PT): Samuel Bouche, NP   Encounter Date: 03/30/2020  PT End of Session - 03/30/20 0908    Visit Number  9    Number of Visits  12    Date for PT Re-Evaluation  04/06/20    PT Start Time  0809    PT Stop Time  0900    PT Time Calculation (min)  51 min    Activity Tolerance  Patient tolerated treatment well    Behavior During Therapy  [facility] for tasks assessed/performed       Past Medical History:  Diagnosis Date  . Hypertension   . Intertrigo   . Post-operative nausea and vomiting   . Reactive thrombocytosis 2011    Past Surgical History:  Procedure Laterality Date  . CESAREAN SECTION     X2  . DILATION AND CURETTAGE OF UTERUS    . KNEE SURGERY     left    There were no vitals filed for this visit.  Subjective Assessment - 03/30/20 0810    Subjective  Pt states she went to [place], [place] to visit family and did a lot of walking. She says she was able to stand from 915 to 1030 which she has been unable to do. Performed yoga with daughter and felt good. R hip is still aching and feels like it has a kink. Pt states she will be traveling to [place] next week and anticipates a lot of walking there as well    Patient Stated Goals  Reduce knee pain.    Currently in Pain?  Yes    Pain Score  3     Pain Location  Knee    Pain Orientation  Right;Left    Pain Descriptors / Indicators  Throbbing;Burning;Aching    Pain Type  Chronic pain         [facility] PT Assessment - 03/30/20 0001      AROM   Right Knee Extension  -6    Right Knee Flexion  108    Left Knee Extension  -6    Left Knee Flexion  93                    [facility] Adult PT Treatment/Exercise - 03/30/20 0001      Ambulation/Gait    Gait Comments  Antalgic with R lateral lean during weight bearing on entry; improved to normal pattern after hip flexor stretching      Knee/Hip Exercises: Stretches   Sports administrator  Both;1 rep;30 seconds   Performed in standing and prone   Gastroc Stretch  Both;30 seconds;2 reps   standing    Other Knee/Hip Stretches  R hip flexor/adductor stretch off table x 30 sec    Other Knee/Hip Stretches  R hip flexor/adductor stretch in prone with pillow under knee x 30 sec      Knee/Hip Exercises: Aerobic   Nustep  L 5 x 5 minutes      Knee/Hip Exercises: Standing   Hip Abduction  10 reps;Stengthening;Both;2 sets   Standing with blue t-band   Hip Extension  10 reps;Both;Stengthening;2 sets   Standing with blue t-band; endrange knee extension mobes     Manual Therapy   Manual Therapy  Soft tissue mobilization;Joint mobilization  Joint Mobilization  grade II-III mobilizations into knee extension and flexion; patellar mobilizations bilaterally in all directions    Soft tissue mobilization  STM along hip adductor/flexors             PT Education - 03/30/20 0911    Education Details  Discussed adding hip adductor stretching and hip strengthening.          PT Long Term Goals - 03/25/20 0819      PT LONG TERM GOAL #1   Title  The patient will be independent with HEP for bilateral LE strengthening and stretching.    Time  6    Period  Weeks    Status  Partially Met      PT LONG TERM GOAL #2   Title  The patient will reduce functional limitation per FOTO from 56% limitation to < or equal to 37% limitation.    Time  6    Period  Weeks    Status  On-going      PT LONG TERM GOAL #3   Title  The patient will improve L knee flexion ROM to 8 to 100 degrees.    Baseline  12 to 88 degrees available L knee ROM    Time  6    Period  Weeks    Status  Partially Met      PT LONG TERM GOAL #4   Title  The patient will improve R knee flexion ROM to 6 to 100 degrees.    Baseline  8  to 95 degrees AROM R knee    Time  6    Period  Weeks    Status  Achieved      PT LONG TERM GOAL #5   Title  The patient will be able to verbalize understanding of ongoing fitness post d/c from PT to [place] gains.    Time  6    Period  Weeks    Status  On-going            Plan - 03/30/20 0909    Clinical Impression Statement  Pt presents with continued R hip pain/discomfort -- treated with stretching, STM to hip flexors/adductors, and addition of R hip strengthening exercises. Provided pt with stretches to address this further at home. Treatment focused on stretching, ROM, and strengthening. Reports improved hip pain by end of session. Pt continues with minimal improvements in knee ROM.    Personal Factors and Comorbidities  Time since onset of injury/illness/exacerbation    Examination-Activity Limitations  Squat;Stairs;Locomotion Level;Sit    Examination-Participation Restrictions  Community Activity    Rehab Potential  Good    PT Frequency  2x / week    PT Duration  6 weeks    PT Treatment/Interventions  ADLs/Self Care Home Management;Gait training;Stair training;Functional mobility training;Therapeutic activities;Therapeutic exercise;Aquatic Therapy;Cryotherapy;Electrical Stimulation;Iontophoresis '4mg'$ /ml Dexamethasone;Moist Heat;Ultrasound;Taping;Patient/family education;Passive range of motion;Dry needling;Manual techniques    PT Next Visit Plan  continue progressive bilat knee ROM and strengthening; consider inclusion of stair training in HEP as pt mentioned this for home; consider educating pt on foam rolling or self myofascial release of her ITB for home    PT Home Exercise Plan  Access code: [email or web address]    Consulted and Agree with Plan of Care  Patient       Patient will benefit from skilled therapeutic intervention in order to improve the following deficits and impairments:  Abnormal gait, Decreased range of motion, Decreased strength, Postural dysfunction, Impaired  flexibility, Pain, Hypomobility  Visit Diagnosis: Chronic pain of right knee  Chronic pain of left knee  Muscle weakness (generalized)  Other symptoms and signs involving the musculoskeletal system     Problem List Patient Active Problem List   Diagnosis Date Noted  . Eczema 02/14/2020  . Weight gain 02/14/2020  . Chronic pain of both knees 01/13/2020  . Osteopenia 12/04/2015  . Menopausal hot flushes 09/17/2014  . Essential thrombocytosis ([place]) 01/13/2012  . Hyperlipidemia 01/13/2012  . 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/2012  . BMI 35.0-35.9,adult 01/06/2012    Miho Monda April Ma L Maloni Musleh PT, DPT 03/30/2020, 9:18 AM  [facility] [place], [place], [address] Phone: [phone]   Fax:  [phone]  Name: Carol Franklin MRN: [id number] Date of Birth: Oct 23, 1961

## 2020-03-31 DIAGNOSIS — Z713 Dietary counseling and surveillance: Secondary | ICD-10-CM | POA: Diagnosis not present

## 2020-03-31 DIAGNOSIS — E669 Obesity, unspecified: Secondary | ICD-10-CM | POA: Diagnosis not present

## 2020-03-31 DIAGNOSIS — Z723 Lack of physical exercise: Secondary | ICD-10-CM | POA: Diagnosis not present

## 2020-03-31 DIAGNOSIS — Z724 Inappropriate diet and eating habits: Secondary | ICD-10-CM | POA: Diagnosis not present

## 2020-04-01 ENCOUNTER — Encounter: Payer: Federal, State, Local not specified - PPO | Admitting: Physical Therapy

## 2020-04-02 ENCOUNTER — Encounter: Payer: Self-pay | Admitting: Physical Therapy

## 2020-04-02 ENCOUNTER — Ambulatory Visit (INDEPENDENT_AMBULATORY_CARE_PROVIDER_SITE_OTHER): Payer: Federal, State, Local not specified - PPO | Admitting: Physical Therapy

## 2020-04-02 ENCOUNTER — Other Ambulatory Visit: Payer: Self-pay

## 2020-04-02 DIAGNOSIS — G8929 Other chronic pain: Secondary | ICD-10-CM

## 2020-04-02 DIAGNOSIS — M6281 Muscle weakness (generalized): Secondary | ICD-10-CM | POA: Diagnosis not present

## 2020-04-02 DIAGNOSIS — M25561 Pain in right knee: Secondary | ICD-10-CM

## 2020-04-02 DIAGNOSIS — M25562 Pain in left knee: Secondary | ICD-10-CM | POA: Diagnosis not present

## 2020-04-02 DIAGNOSIS — R29898 Other symptoms and signs involving the musculoskeletal system: Secondary | ICD-10-CM | POA: Diagnosis not present

## 2020-04-02 NOTE — Therapy (Addendum)
[place], [place], [address] Phone: [phone]   Fax:  [phone]  Physical Therapy Treatment and Renewal Summary  Patient Details  Name: [name] MRN: [id number] Date of Birth: 09/28/1961 Referring Provider (PT): Samuel Bouche, NP   Encounter Date: 04/02/2020  PT End of Session - 04/02/20 0850    Visit Number  9   Number of Visits  12    Date for PT Re-Evaluation  04/06/20    PT Start Time  0803    PT Stop Time  0845    PT Time Calculation (min)  42 min    Activity Tolerance  Patient tolerated treatment well    Behavior During Therapy  [facility] for tasks assessed/performed       Past Medical History:  Diagnosis Date  . Hypertension   . Intertrigo   . Post-operative nausea and vomiting   . Reactive thrombocytosis 2011    Past Surgical History:  Procedure Laterality Date  . CESAREAN SECTION     X2  . DILATION AND CURETTAGE OF UTERUS    . KNEE SURGERY     left    There were no vitals filed for this visit.  Subjective Assessment - 04/02/20 0806    Subjective  "I think I'm doing better".  Pt reports she was able to rest better better.  Doing more exercises during the day. Overall pain has decreased; not as intense.  She reports 50% improvement since starting therapy.    Currently in Pain?  Yes    Pain Score  3     Pain Location  Knee    Pain Orientation  Left;Right   Rt>Lt   Pain Descriptors / Indicators  Aching;Tightness    Aggravating Factors   first thing in AM    Pain Relieving Factors  massage, exercises         [facility] PT Assessment - 04/02/20 0001      Assessment   Medical Diagnosis  chronic pain of both knees    Referring Provider (PT)  Samuel Bouche, NP    Onset Date/Surgical Date  02/14/20    Next MD Visit  PRN    Prior Therapy  PT last year      Strength   Right Hip Extension  4+/5    Right Hip ABduction  --   5-/5   Left Hip Flexion  4/5    Left Hip Extension   4-/5      Flexibility   Quadriceps  Rt 94 deg; Lt 77 deg       [facility] Adult PT Treatment/Exercise - 04/02/20 0001      Knee/Hip Exercises: Stretches   Quad Stretch  Right;Left;2 reps;20 seconds;60 seconds   prone with strap   Hip Flexor Stretch  Right;Left;2 reps;20 seconds   Thomas position    ITB Stretch  Right;Left;2 reps;20 seconds   supine with strap   Gastroc Stretch  Both;2 reps;20 seconds   incline board   Other Knee/Hip Stretches  Rt/Lt adductor stretch with strap in supine x 20 sec       Knee/Hip Exercises: Aerobic   Nustep  L4: 5 min (legs only)      Knee/Hip Exercises: Supine   Straight Leg Raises  Strengthening;Left;1 set;15 reps      Knee/Hip Exercises: Sidelying   Hip ABduction  Strengthening;Right;Left;1 set;15 reps      Knee/Hip Exercises: Prone   Hip Extension  Left;1 set;10 reps  Other Prone Exercises  opp arm/ leg x 5 reps x 2 sets       Manual Therapy   Soft tissue mobilization  STM to bilat hamstrings while pt in prone hang to decrease fascial restrictions and improve ROM     trial of piriformis in supine; limited due to decreased knee flexion.     PT Long Term Goals - 04/02/20 0817      PT LONG TERM GOAL #1   Title  The patient will be independent with HEP for bilateral LE strengthening and stretching.    Time  6    Period  Weeks    Status  Partially Met      PT LONG TERM GOAL #2   Title  The patient will reduce functional limitation per FOTO from 56% limitation to < or equal to 37% limitation.    Time  6    Period  Weeks    Status  On-going      PT LONG TERM GOAL #3   Title  The patient will improve L knee flexion ROM to 8 to 100 degrees.    Baseline  6-93 degrees available L knee ROM    Time  6    Period  Weeks    Status  Partially Met      PT LONG TERM GOAL #4   Title  The patient will improve R knee flexion ROM to 6 to 100 degrees.    Baseline  6-108 degrees AROM R knee    Time  6    Period  Weeks    Status  Achieved       PT LONG TERM GOAL #5   Title  The patient will be able to verbalize understanding of ongoing fitness post d/c from PT to [place] gains.    Time  6    Period  Weeks    Status  On-going       UPDATED LONG TERM GOALS:  PT Long Term Goals - 04/09/20 1420      PT LONG TERM GOAL #1   Title  The patient will be independent with HEP for bilateral LE strengthening and stretching.    Time  6    Period  Weeks    Status  Revised    Target Date  05/18/20      PT LONG TERM GOAL #2   Title  The patient will reduce functional limitation per FOTO from 56% limitation to < or equal to 37% limitation.    Time  6    Period  Weeks    Status  Revised    Target Date  05/18/20      PT LONG TERM GOAL #3   Title  The patient will improve L knee flexion ROM to 8 to 100 degrees.    Baseline  6-93 degrees available L knee ROM    Time  6    Period  Weeks    Status  Revised    Target Date  05/18/20      PT LONG TERM GOAL #4   Title  The patient will improve R knee flexion ROM to 6 to 100 degrees.    Baseline  6-108 degrees AROM R knee    Time  6    Period  Weeks    Status  Achieved      PT LONG TERM GOAL #5   Title  The patient will be able to verbalize understanding of aquatic program for ongoing  fitness post d/c from PT    Time  6    Period  Weeks    Status  Revised    Target Date  05/18/20           Plan - 04/02/20 0842    Clinical Impression Statement  Pt presents with overall reduction of pain in bilat knees; verbalizes improved mobility with compliance with HEP and moving more during day.  Continued focus on stretching LE, strengthening weak areas of hips, and ROM of knees.  Progressing gradually towards goals.    Personal Factors and Comorbidities  Time since onset of injury/illness/exacerbation    Examination-Activity Limitations  Squat;Stairs;Locomotion Level;Sit    Examination-Participation Restrictions  Community Activity    Rehab Potential  Good    PT Frequency  2x / week     PT Duration  6 weeks    PT Treatment/Interventions  ADLs/Self Care Home Management;Gait training;Stair training;Functional mobility training;Therapeutic activities;Therapeutic exercise;Aquatic Therapy;Cryotherapy;Electrical Stimulation;Iontophoresis 9m/ml Dexamethasone;Moist Heat;Ultrasound;Taping;Patient/family education;Passive range of motion;Dry needling;Manual techniques    PT Next Visit Plan  continue progressive bilat knee ROM and strengthening    PT Home Exercise Plan  Access code: [email or web address]   Consulted and Agree with Plan of Care  Patient       Patient will benefit from skilled therapeutic intervention in order to improve the following deficits and impairments:  Abnormal gait, Decreased range of motion, Decreased strength, Postural dysfunction, Impaired flexibility, Pain, Hypomobility  Visit Diagnosis: Chronic pain of right knee  Chronic pain of left knee  Muscle weakness (generalized)  Other symptoms and signs involving the musculoskeletal system     Problem List Patient Active Problem List   Diagnosis Date Noted  . Eczema 02/14/2020  . Weight gain 02/14/2020  . Chronic pain of both knees 01/13/2020  . Osteopenia 12/04/2015  . Menopausal hot flushes 09/17/2014  . Essential thrombocytosis ([place] 01/13/2012  . Hyperlipidemia 01/13/2012  . 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/2012  . BMI 35.0-35.9,adult 01/06/2012    WRudell Cobb PBethania PTA 04/02/20 6:15 PM  [place] [address]Phone: [phone]  Fax:  [phone] Name: Carol HALLYMRN: [id number] of Birth: 612-14-1962

## 2020-04-08 ENCOUNTER — Encounter: Payer: Federal, State, Local not specified - PPO | Admitting: Physical Therapy

## 2020-04-09 ENCOUNTER — Encounter: Payer: Self-pay | Admitting: Rehabilitative and Restorative Service Providers"

## 2020-04-09 NOTE — Addendum Note (Signed)
Addended by: Rudell Cobb M on: 04/09/2020 02:24 PM   Modules accepted: Orders

## 2020-04-13 ENCOUNTER — Encounter: Payer: Self-pay | Admitting: Physical Therapy

## 2020-04-13 ENCOUNTER — Encounter: Payer: Federal, State, Local not specified - PPO | Admitting: Physical Therapy

## 2020-04-13 ENCOUNTER — Other Ambulatory Visit: Payer: Self-pay

## 2020-04-13 ENCOUNTER — Ambulatory Visit: Payer: Federal, State, Local not specified - PPO | Attending: Medical-Surgical | Admitting: Physical Therapy

## 2020-04-13 DIAGNOSIS — G8929 Other chronic pain: Secondary | ICD-10-CM | POA: Insufficient documentation

## 2020-04-13 DIAGNOSIS — M6281 Muscle weakness (generalized): Secondary | ICD-10-CM | POA: Insufficient documentation

## 2020-04-13 DIAGNOSIS — M25562 Pain in left knee: Secondary | ICD-10-CM | POA: Insufficient documentation

## 2020-04-13 DIAGNOSIS — R29898 Other symptoms and signs involving the musculoskeletal system: Secondary | ICD-10-CM | POA: Diagnosis not present

## 2020-04-13 DIAGNOSIS — M25561 Pain in right knee: Secondary | ICD-10-CM | POA: Insufficient documentation

## 2020-04-14 NOTE — Therapy (Signed)
[facility] [place] [address] [place], [place], [address] Phone: [phone]   Fax:  [phone]  Physical Therapy Treatment  Patient Details  Name: Carol Franklin MRN: [id number] Date of Birth: Jun 23, 1961 Referring Provider (PT): Samuel Bouche, NP   Encounter Date: 04/13/2020  PT End of Session - 04/13/20 1905    Visit Number  10    Number of Visits  20    Date for PT Re-Evaluation  05/18/20    PT Start Time  1550    PT Stop Time  1630    PT Time Calculation (min)  40 min    Equipment Utilized During Treatment  Other (comment)   ankle buoyancy cuffs, hand bar bells   Activity Tolerance  Patient tolerated treatment well    Behavior During Therapy  [facility] for tasks assessed/performed       Past Medical History:  Diagnosis Date  . Hypertension   . Intertrigo   . Post-operative nausea and vomiting   . Reactive thrombocytosis 2011    Past Surgical History:  Procedure Laterality Date  . CESAREAN SECTION     X2  . DILATION AND CURETTAGE OF UTERUS    . KNEE SURGERY     left    There were no vitals filed for this visit.  Subjective Assessment - 04/13/20 1902    Subjective  Pt states she has participated in aquatic classes at [facility] approx 5 years ago and would like to return to being able to take classes again.  Feels like her joints get "stiffer" than they used to.    Currently in Pain?  Yes    Pain Score  4     Pain Location  Knee    Pain Orientation  Right;Left    Pain Descriptors / Indicators  Aching    Pain Type  Chronic pain    Pain Onset  More than a month ago       Aquatic therapy at [facility] - pool temp 86.7degrees  Patient seen for aquatic therapy today. Treatment took place in water 3.5-4 feet deep depending upon activity. Pt entered and exited the pool viasteps withsupervision:   Runners stretch bil LE's x 30 seconds eachand toes up wall x 30 sec.  Hamstring stretch at pool rail x 30 sec each  side.  Sit<>stand x 10 reps from bench with min-supervision. Needed cues for forward lean to prevent leaning back against bench with LE's.  Pt performed gait training in pool forwards 58m x2 reps then 2repsworking on hands underwater for armswing, 31mx 2 reps backwards,4m x 2 repsside stepping, 60m x 2 performing side step squat withhand bar bells.  Performedbil LE marching, hip extension, hip abd, hamstring curl, SLR with buoyancy cuffs. Pt able to perform 15repsalternating sides. Heel raises x 15.Pt using pool edge for UE support for most of exercises.  Facing away from pool edge and performed bil UE shoulder flexion/extension with arm bar bells x 15 reps.  Ai Chi posture offloating,enclosing,soothingin semi squat positon x 10 reps.     Pt requires buoyancy for support with balance and viscosity for resistance for strengthening exercises; buoyancy is also needed for off loading body to assist with exercises.      PT Long Term Goals - 04/09/20 1420      PT LONG TERM GOAL #1   Title  The patient will be independent with HEP for bilateral LE strengthening and stretching.    Time  6  Period  Weeks    Status  Revised    Target Date  05/18/20      PT LONG TERM GOAL #2   Title  The patient will reduce functional limitation per FOTO from 56% limitation to < or equal to 37% limitation.    Time  6    Period  Weeks    Status  Revised    Target Date  05/18/20      PT LONG TERM GOAL #3   Title  The patient will improve L knee flexion ROM to 8 to 100 degrees.    Baseline  6-93 degrees available L knee ROM    Time  6    Period  Weeks    Status  Revised    Target Date  05/18/20      PT LONG TERM GOAL #4   Title  The patient will improve R knee flexion ROM to 6 to 100 degrees.    Baseline  6-108 degrees AROM R knee    Time  6    Period  Weeks    Status  Achieved      PT LONG TERM GOAL #5   Title  The patient will be able to verbalize understanding  of aquatic program for ongoing fitness post d/c from PT    Time  6    Period  Weeks    Status  Revised    Target Date  05/18/20            Plan - 04/13/20 1906    Clinical Impression Statement  Pt reports decrease in stiffness after session today.  Was challenged with balance activities in pool as well.  Pt hoping to be able to return to taking classes at [facility] on her own and reports feeling like it would be very beneficial for her.  Cont PT per POC.    Personal Factors and Comorbidities  Time since onset of injury/illness/exacerbation    Examination-Activity Limitations  Squat;Stairs;Locomotion Level;Sit    Examination-Participation Restrictions  Community Activity    Rehab Potential  Good    PT Frequency  2x / week    PT Duration  6 weeks    PT Treatment/Interventions  ADLs/Self Care Home Management;Gait training;Stair training;Functional mobility training;Therapeutic activities;Therapeutic exercise;Aquatic Therapy;Cryotherapy;Electrical Stimulation;Iontophoresis 4mg /ml Dexamethasone;Moist Heat;Ultrasound;Taping;Patient/family education;Passive range of motion;Dry needling;Manual techniques    PT Next Visit Plan  continue progressive bilat knee ROM and strengthening    PT Home Exercise Plan  Access code: [email or web address]    Consulted and Agree with Plan of Care  Patient       Patient will benefit from skilled therapeutic intervention in order to improve the following deficits and impairments:  Abnormal gait, Decreased range of motion, Decreased strength, Postural dysfunction, Impaired flexibility, Pain, Hypomobility  Visit Diagnosis: Chronic pain of right knee  Chronic pain of left knee  Muscle weakness (generalized)  Other symptoms and signs involving the musculoskeletal system     Problem List Patient Active Problem List   Diagnosis Date Noted  . Eczema 02/14/2020  . Weight gain 02/14/2020  . Chronic pain of both knees 01/13/2020  . Osteopenia 12/04/2015  . Menopausal hot  flushes 09/17/2014  . Essential thrombocytosis ([place]) 01/13/2012  . Hyperlipidemia 01/13/2012  . 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/2012  . BMI 35.0-35.9,adult 01/06/2012    Narda Bonds, PTA [place] 04/14/20 2:36 PM Phone: [phone] Fax: [place] [address] [place], [place], [address]  Phone: [phone]   Fax:  [phone]  Name: ZANIYA MCAULAY MRN: [id number] Date of Birth: 1961/09/18

## 2020-04-15 ENCOUNTER — Ambulatory Visit: Payer: Federal, State, Local not specified - PPO | Admitting: Physical Therapy

## 2020-04-15 ENCOUNTER — Other Ambulatory Visit: Payer: Self-pay

## 2020-04-15 DIAGNOSIS — R29898 Other symptoms and signs involving the musculoskeletal system: Secondary | ICD-10-CM | POA: Diagnosis not present

## 2020-04-15 DIAGNOSIS — M25562 Pain in left knee: Secondary | ICD-10-CM

## 2020-04-15 DIAGNOSIS — M6281 Muscle weakness (generalized): Secondary | ICD-10-CM

## 2020-04-15 DIAGNOSIS — G8929 Other chronic pain: Secondary | ICD-10-CM

## 2020-04-15 DIAGNOSIS — M25561 Pain in right knee: Secondary | ICD-10-CM

## 2020-04-15 NOTE — Therapy (Signed)
[place], [place], [address] Phone: [phone]   Fax:  [phone]  Physical Therapy Treatment  Patient Details  Name: Carol Franklin MRN: [id number] Date of Birth: 08-Aug-1961 Referring Provider (PT): Samuel Bouche, NP   Encounter Date: 04/15/2020  PT End of Session - 04/15/20 0714    Visit Number  11    Number of Visits  20    Date for PT Re-Evaluation  05/18/20    PT Start Time  4174    PT Stop Time  0747   pt had to leave early for another appt.   PT Time Calculation (min)  34 min    Equipment Utilized During Treatment  Other (comment)   ankle buoyancy cuffs, hand bar bells   Activity Tolerance  Patient tolerated treatment well    Behavior During Therapy  [facility] for tasks assessed/performed       Past Medical History:  Diagnosis Date  . Hypertension   . Intertrigo   . Post-operative nausea and vomiting   . Reactive thrombocytosis 2011    Past Surgical History:  Procedure Laterality Date  . CESAREAN SECTION     X2  . DILATION AND CURETTAGE OF UTERUS    . KNEE SURGERY     left    There were no vitals filed for this visit.  Subjective Assessment - 04/15/20 0715    Subjective  Pt reports she walked 20 min last night; knees throbbed afterward. She has a goal to walk every day.  "I can feel I did some work" in the water.    Currently in Pain?  Yes    Pain Score  3     Pain Location  Hip    Pain Orientation  Right    Aggravating Factors   prolonged sitting    Pain Relieving Factors  exercises         [facility] PT Assessment - 04/15/20 0001      Assessment   Medical Diagnosis  chronic pain of both knees    Referring Provider (PT)  Samuel Bouche, NP    Onset Date/Surgical Date  02/14/20    Next MD Visit  PRN    Prior Therapy  PT last year      AROM   Right Knee Flexion  98   106 after IASTM   Left Knee Flexion  27       [facility] Adult PT Treatment/Exercise - 04/15/20 0001      Knee/Hip Exercises: Stretches   Passive Hamstring Stretch  Right;2 reps;20 seconds   seated, hip hinge   Quad Stretch  Left;3 reps;Right;2 reps;20 seconds   prone with strap, small noodle under knee   Knee: Self-Stretch to increase Flexion  Right;Left;5 reps;10 seconds   forward lunge on 12" step   Piriformis Stretch  Right;1 rep;20 seconds   modified pigeon pose   Gastroc Stretch  Both;3 reps;30 seconds    Other Knee/Hip Stretches  seated scoots x 2 reps of 10 sec each leg      Knee/Hip Exercises: Aerobic   Nustep  L 5 x 4 minutes (arms/legs)      Manual Therapy   Soft tissue mobilization  IASTM to bilat mid-distal quad and STM to Rt hamstring to decrease fascial restrictions and improve mobility.          PT Long Term Goals - 04/09/20 1420      PT LONG TERM GOAL #1   Title  The patient will be independent with HEP for bilateral LE strengthening and stretching.    Time  6    Period  Weeks    Status  Revised    Target Date  05/18/20      PT LONG TERM GOAL #2   Title  The patient will reduce functional limitation per FOTO from 56% limitation to < or equal to 37% limitation.    Time  6    Period  Weeks    Status  Revised    Target Date  05/18/20      PT LONG TERM GOAL #3   Title  The patient will improve L knee flexion ROM to 8 to 100 degrees.    Baseline  6-93 degrees available L knee ROM    Time  6    Period  Weeks    Status  Revised    Target Date  05/18/20      PT LONG TERM GOAL #4   Title  The patient will improve R knee flexion ROM to 6 to 100 degrees.    Baseline  6-108 degrees AROM R knee    Time  6    Period  Weeks    Status  Achieved      PT LONG TERM GOAL #5   Title  The patient will be able to verbalize understanding of aquatic program for ongoing fitness post d/c from PT    Time  6    Period  Weeks    Status  Revised    Target Date  05/18/20            Plan - 04/15/20 0719    Clinical Impression Statement  Positive response to aquatic  session; will continue to progress in water exercises weekly. Slight decrease in knee flexion ROM since last visit, however improved with IASTM and stretches. Pt had difficulty tolerating piriformis stretch due to increase in discomfort in groin.  Pt continues to make gradual progress towards LTGs. Will benefit from continued PT intervention to improve mobility.    Personal Factors and Comorbidities  Time since onset of injury/illness/exacerbation    Examination-Activity Limitations  Squat;Stairs;Locomotion Level;Sit    Examination-Participation Restrictions  Community Activity    Rehab Potential  Good    PT Frequency  2x / week    PT Duration  6 weeks    PT Treatment/Interventions  ADLs/Self Care Home Management;Gait training;Stair training;Functional mobility training;Therapeutic activities;Therapeutic exercise;Aquatic Therapy;Cryotherapy;Electrical Stimulation;Iontophoresis 4mg /ml Dexamethasone;Moist Heat;Ultrasound;Taping;Patient/family education;Passive range of motion;Dry needling;Manual techniques    PT Next Visit Plan  continue progressive bilat knee ROM and strengthening    PT Home Exercise Plan  Access code: [email or web address]    Consulted and Agree with Plan of Care  Patient       Patient will benefit from skilled therapeutic intervention in order to improve the following deficits and impairments:  Abnormal gait, Decreased range of motion, Decreased strength, Postural dysfunction, Impaired flexibility, Pain, Hypomobility  Visit Diagnosis: Chronic pain of left knee  Chronic pain of right knee  Muscle weakness (generalized)  Other symptoms and signs involving the musculoskeletal system     Problem List Patient Active Problem List   Diagnosis Date Noted  . Eczema 02/14/2020  . Weight gain 02/14/2020  . Chronic pain of both knees 01/13/2020  . Osteopenia 12/04/2015  . Menopausal hot flushes 09/17/2014  . Essential thrombocytosis ([place]) 01/13/2012  . Hyperlipidemia 01/13/2012  .  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/2012  . BMI 35.0-35.9,adult 01/06/2012   [place]  [place], PTA 04/15/20 8:00 AM  [place], [place], [address] Phone: [phone]   Fax:  [phone]  Name: Carol Franklin MRN: [id number] Date of Birth: [birth year]

## 2020-04-20 ENCOUNTER — Ambulatory Visit: Payer: Federal, State, Local not specified - PPO | Admitting: Physical Therapy

## 2020-04-20 DIAGNOSIS — E669 Obesity, unspecified: Secondary | ICD-10-CM | POA: Diagnosis not present

## 2020-04-20 DIAGNOSIS — Z713 Dietary counseling and surveillance: Secondary | ICD-10-CM | POA: Diagnosis not present

## 2020-04-20 DIAGNOSIS — Z723 Lack of physical exercise: Secondary | ICD-10-CM | POA: Diagnosis not present

## 2020-04-20 DIAGNOSIS — Z724 Inappropriate diet and eating habits: Secondary | ICD-10-CM | POA: Diagnosis not present

## 2020-04-22 ENCOUNTER — Other Ambulatory Visit: Payer: Self-pay

## 2020-04-22 ENCOUNTER — Encounter: Payer: Self-pay | Admitting: Physical Therapy

## 2020-04-22 ENCOUNTER — Ambulatory Visit (INDEPENDENT_AMBULATORY_CARE_PROVIDER_SITE_OTHER): Payer: Federal, State, Local not specified - PPO | Admitting: Physical Therapy

## 2020-04-22 DIAGNOSIS — M6281 Muscle weakness (generalized): Secondary | ICD-10-CM | POA: Diagnosis not present

## 2020-04-22 DIAGNOSIS — G8929 Other chronic pain: Secondary | ICD-10-CM

## 2020-04-22 DIAGNOSIS — R29898 Other symptoms and signs involving the musculoskeletal system: Secondary | ICD-10-CM | POA: Diagnosis not present

## 2020-04-22 DIAGNOSIS — M25562 Pain in left knee: Secondary | ICD-10-CM | POA: Diagnosis not present

## 2020-04-22 DIAGNOSIS — M25561 Pain in right knee: Secondary | ICD-10-CM

## 2020-04-22 NOTE — Therapy (Signed)
[place], [place], [address] Phone: [phone]   Fax:  [phone]  Physical Therapy Treatment  Patient Details  Name: Carol Franklin MRN: [id number] Date of Birth: [birth year] Referring Provider (PT): Samuel Bouche, NP   Encounter Date: 04/22/2020   PT End of Session - 04/22/20 0716    Visit Number 12    Number of Visits 20    Date for PT Re-Evaluation 05/18/20    PT Start Time 0714    PT Stop Time 0803   MHP last 10 min   PT Time Calculation (min) 49 min    Equipment Utilized During Treatment Other (comment)   ankle buoyancy cuffs, hand bar bells   Activity Tolerance Patient tolerated treatment well    Behavior During Therapy [facility] for tasks assessed/performed           Past Medical History:  Diagnosis Date  . Hypertension   . Intertrigo   . Post-operative nausea and vomiting   . Reactive thrombocytosis 2011    Past Surgical History:  Procedure Laterality Date  . CESAREAN SECTION     X2  . DILATION AND CURETTAGE OF UTERUS    . KNEE SURGERY     left    There were no vitals filed for this visit.   Subjective Assessment - 04/22/20 0717    Subjective Pt reports she missed her aquatic session due to a schedule conflict at work.  She continues to walk and do her stretches.  She feels like she's making steady progress.  Knees feel stiff this morning.    Currently in Pain? Yes    Pain Score 3     Pain Location Hip    Pain Orientation Right    Pain Descriptors / Indicators Aching    Aggravating Factors  prolonged sitting    Pain Relieving Factors ?              [facility] PT Assessment - 04/22/20 0001      Assessment   Medical Diagnosis chronic pain of both knees    Referring Provider (PT) Samuel Bouche, NP    Onset Date/Surgical Date 02/14/20    Next MD Visit PRN    Prior Therapy PT last year      Observation/Other Assessments   Focus on Therapeutic Outcomes (FOTO)  41% limitation  (goal 37%)      AROM   Right Knee Extension -7    Right Knee Flexion 108    Left Knee Extension -10    Left Knee Flexion 93             [facility] Adult PT Treatment/Exercise - 04/22/20 0001      Knee/Hip Exercises: Stretches   Passive Hamstring Stretch Left;Right;2 reps;20 seconds   foot on 12" step   Passive Hamstring Stretch Limitations overpressure by pt.     Hip Flexor Stretch Right;2 reps;20 seconds   Thomas position    Hip Flexor Stretch Limitations holding Lt knee towards chest.     Knee: Self-Stretch to increase Flexion Right;Left;5 reps;10 seconds   forward lunge on 12" step   Knee: Self-Stretch Limitations cues to slow counting     ITB Stretch Right;Left;2 reps;20 seconds    Piriformis Stretch Right;2 reps;20 seconds   PTA assist in supine   Other Knee/Hip Stretches deep squat (buttocks to 10" step) UE support on metal rail x 5 reps to increase knee/hip flexion    Other Knee/Hip Stretches modified downward  dog with hands on black mat table x 25 sec x 2      Knee/Hip Exercises: Aerobic   Nustep L4: 4 min      Modalities   Modalities Moist Heat      Moist Heat Therapy   Number Minutes Moist Heat 10 Minutes    Moist Heat Location Knee;Hip   bilat post knees; Rt groin     Manual Therapy   Joint Mobilization grade II-III mobilizations into knee extension and flexion    Soft tissue mobilization TPR to Rt adductor     Passive ROM Lt/Rt knee into extension                       PT Long Term Goals - 04/09/20 1420      PT LONG TERM GOAL #1   Title The patient will be independent with HEP for bilateral LE strengthening and stretching.    Time 6    Period Weeks    Status Revised    Target Date 05/18/20      PT LONG TERM GOAL #2   Title The patient will reduce functional limitation per FOTO from 56% limitation to < or equal to 37% limitation.    Time 6    Period Weeks    Status Revised    Target Date 05/18/20      PT LONG TERM GOAL #3   Title The  patient will improve L knee flexion ROM to 8 to 100 degrees.    Baseline    Time 6    Period Weeks    Status Revised    Target Date 05/18/20      PT LONG TERM GOAL #4   Title The patient will improve R knee flexion ROM to 6 to 100 degrees.    Baseline 6-108 degrees AROM R knee    Time 6    Period Weeks    Status Achieved      PT LONG TERM GOAL #5   Title The patient will be able to verbalize understanding of aquatic program for ongoing fitness post d/c from PT    Time 6    Period Weeks    Status Revised    Target Date 05/18/20                 Plan - 04/22/20 0828    Clinical Impression Statement Knee ROM measurements have returned to what they were before her vacation.  Pain in Rt groin with attempts at piriformis stretch.  FOTO has improved and pt is reporting improved ability to stand for longer periods.  Progressing towards goals. Pt has goal to walk 5k in Sept.    Personal Factors and Comorbidities Time since onset of injury/illness/exacerbation    Examination-Activity Limitations Squat;Stairs;Locomotion Level;Sit    Examination-Participation Restrictions Community Activity    Rehab Potential Good    PT Frequency 2x / week    PT Duration 6 weeks    PT Treatment/Interventions ADLs/Self Care Home Management;Gait training;Stair training;Functional mobility training;Therapeutic activities;Therapeutic exercise;Aquatic Therapy;Cryotherapy;Electrical Stimulation;Iontophoresis 4mg /ml Dexamethasone;Moist Heat;Ultrasound;Taping;Patient/family education;Passive range of motion;Dry needling;Manual techniques    PT Next Visit Plan continue progressive bilat knee ROM and strengthening    PT Home Exercise Plan Access code: [email or web address]    Consulted and Agree with Plan of Care Patient           Patient will benefit from skilled therapeutic intervention in order to improve the following deficits and impairments:  Abnormal gait, Decreased  range of motion, Decreased strength, Postural  dysfunction, Impaired flexibility, Pain, Hypomobility  Visit Diagnosis: Chronic pain of left knee  Chronic pain of right knee  Muscle weakness (generalized)  Other symptoms and signs involving the musculoskeletal system     Problem List Patient Active Problem List   Diagnosis Date Noted  . Eczema 02/14/2020  . Weight gain 02/14/2020  . Chronic pain of both knees 01/13/2020  . Osteopenia 12/04/2015  . Menopausal hot flushes 09/17/2014  . Essential thrombocytosis ([place]) 01/13/2012  . Hyperlipidemia 01/13/2012  . 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/2012  . BMI 35.0-35.9,adult 01/06/2012   Kerin Perna, PTA 04/22/20 8:52 AM  [facility] [place], [place], [address] Phone: [phone]   Fax:  [phone]  Name: Carol Franklin MRN: [id number] Date of Birth: [birth year]

## 2020-04-23 ENCOUNTER — Encounter: Payer: Federal, State, Local not specified - PPO | Admitting: Physical Therapy

## 2020-04-27 ENCOUNTER — Encounter: Payer: Self-pay | Admitting: Physical Therapy

## 2020-04-27 ENCOUNTER — Ambulatory Visit: Payer: Federal, State, Local not specified - PPO | Admitting: Physical Therapy

## 2020-04-27 ENCOUNTER — Other Ambulatory Visit: Payer: Self-pay

## 2020-04-27 DIAGNOSIS — G8929 Other chronic pain: Secondary | ICD-10-CM | POA: Diagnosis not present

## 2020-04-27 DIAGNOSIS — R29898 Other symptoms and signs involving the musculoskeletal system: Secondary | ICD-10-CM | POA: Diagnosis not present

## 2020-04-27 DIAGNOSIS — M25562 Pain in left knee: Secondary | ICD-10-CM

## 2020-04-27 DIAGNOSIS — M6281 Muscle weakness (generalized): Secondary | ICD-10-CM | POA: Diagnosis not present

## 2020-04-27 DIAGNOSIS — M25561 Pain in right knee: Secondary | ICD-10-CM | POA: Diagnosis not present

## 2020-04-27 NOTE — Therapy (Signed)
[facility] [address] [place], [place], [address] Phone: [phone]   Fax:  [phone]  Physical Therapy Treatment  Patient Details  Name: Carol Franklin MRN: [id number] Date of Birth: 1961/03/07 Referring Provider (PT): Samuel Bouche, NP   Encounter Date: 04/27/2020   PT End of Session - 04/27/20 2001    Visit Number 13    Number of Visits 20    Date for PT Re-Evaluation 05/18/20    PT Start Time 6579    PT Stop Time 1630    PT Time Calculation (min) 45 min    Equipment Utilized During Treatment Other (comment)   ankle buoyancy cuffs, hand bar bells   Activity Tolerance Patient tolerated treatment well    Behavior During Therapy [facility] for tasks assessed/performed           Past Medical History:  Diagnosis Date  . Hypertension   . Intertrigo   . Post-operative nausea and vomiting   . Reactive thrombocytosis 2011    Past Surgical History:  Procedure Laterality Date  . CESAREAN SECTION     X2  . DILATION AND CURETTAGE OF UTERUS    . KNEE SURGERY     left    There were no vitals filed for this visit.   Subjective Assessment - 04/27/20 2000    Subjective Still having stiffness in her knees and feels like they might "give way" sometimes so she tends to keep them extended when walking.    Currently in Pain? No/denies   Stiffness           Aquatic therapy at [facility] - pool temp 87.4degrees  Patient seen for aquatic therapy today. Treatment took place in water 3.5-4 feet deep depending upon activity. Pt entered and exited the pool viarampwithsupervision:   Runners stretch bil LE's x 30 seconds eachand toes up wall x 30 sec. Hamstring stretch at pool rail x 30 sec each side.  Sit<>stand x 10 reps from bench with min-supervision. Needed cues for forward lean to prevent leaning back against bench with LE's.    Pt performed gait training in pool forwards 92m x2 reps then 2repsworking on hands  underwater for armswing, 30mx 2 reps backwards,25m x 2 repsside stepping, 66m x 2 performing side step squat withhand bar bells.  Performedbil LE exercises with ankle buoyancy cuffs-marching, hip extension, hip abd, hamstring curl, hip flexion moving into hip extension, hip add across midline using wall as target Pt able to perform 15repsalternating sides. Pt using pool edge for UE supportfor most of exercises.  Removed cuffs and performed bil hip external rotation with flexed knee bil sides x 15 reps and straight leg circles both clockwise and counterclock wise directions x 15 reps.  Ai Chi posture offloating,enclosing,soothingin semi squat positon x 10 reps.   Tandem gait 55m x 1 and braiding 20 m x 1  Pt requires buoyancy for support with balance and viscosity for resistance for strengthening exercises; buoyancy is also needed for off loading body to assist with exercises.     PT Long Term Goals - 04/09/20 1420      PT LONG TERM GOAL #1   Title The patient will be independent with HEP for bilateral LE strengthening and stretching.    Time 6    Period Weeks    Status Revised    Target Date 05/18/20      PT LONG TERM GOAL #2   Title The patient will reduce functional limitation per [facility]  from 56% limitation to < or equal to 37% limitation.    Time 6    Period Weeks    Status Revised    Target Date 05/18/20      PT LONG TERM GOAL #3   Title The patient will improve L knee flexion ROM to 8 to 100 degrees.    Baseline 6-93 degrees available L knee ROM    Time 6    Period Weeks    Status Revised    Target Date 05/18/20      PT LONG TERM GOAL #4   Title The patient will improve R knee flexion ROM to 6 to 100 degrees.    Baseline 6-108 degrees AROM R knee    Time 6    Period Weeks    Status Achieved      PT LONG TERM GOAL #5   Title The patient will be able to verbalize understanding of aquatic program for ongoing fitness post d/c from PT    Time 6     Period Weeks    Status Revised    Target Date 05/18/20                 Plan - 04/27/20 2002    Clinical Impression Statement Pt continues to report stiffness in knees and groin but feels like it is improving.  Pt tolerated increased activity today in pool.  Challenged with balance with Ai Chi postures and backwards ambulation.  Cont per poc.    Personal Factors and Comorbidities Time since onset of injury/illness/exacerbation    Examination-Activity Limitations Squat;Stairs;Locomotion Level;Sit    Examination-Participation Restrictions Community Activity    Rehab Potential Good    PT Frequency 2x / week    PT Duration 6 weeks    PT Treatment/Interventions ADLs/Self Care Home Management;Gait training;Stair training;Functional mobility training;Therapeutic activities;Therapeutic exercise;Aquatic Therapy;Cryotherapy;Electrical Stimulation;Iontophoresis 4mg /ml Dexamethasone;Moist Heat;Ultrasound;Taping;Patient/family education;Passive range of motion;Dry needling;Manual techniques    PT Next Visit Plan continue progressive bilat knee ROM and strengthening    PT Home Exercise Plan Access code: [email or web address]    Consulted and Agree with Plan of Care Patient           Patient will benefit from skilled therapeutic intervention in order to improve the following deficits and impairments:  Abnormal gait, Decreased range of motion, Decreased strength, Postural dysfunction, Impaired flexibility, Pain, Hypomobility  Visit Diagnosis: Chronic pain of left knee  Chronic pain of right knee  Muscle weakness (generalized)  Other symptoms and signs involving the musculoskeletal system     Problem List Patient Active Problem List   Diagnosis Date Noted  . Eczema 02/14/2020  . Weight gain 02/14/2020  . Chronic pain of both knees 01/13/2020  . Osteopenia 12/04/2015  . Menopausal hot flushes 09/17/2014  . Essential thrombocytosis ([place]) 01/13/2012  . Hyperlipidemia 01/13/2012  . Menopause  syndrome 01/13/2012  . BMI 35.0-35.9,adult 01/06/2012   Narda Bonds, PTA [place] 04/27/20 8:10 PM Phone: [phone] Fax: [facility] [address] [place], [place], [address] Phone: [phone]   Fax:  [phone]  Name: Carol Franklin MRN: [id number] Date of Birth: May 26, 1961

## 2020-04-28 DIAGNOSIS — Z723 Lack of physical exercise: Secondary | ICD-10-CM | POA: Diagnosis not present

## 2020-04-28 DIAGNOSIS — Z724 Inappropriate diet and eating habits: Secondary | ICD-10-CM | POA: Diagnosis not present

## 2020-04-28 DIAGNOSIS — E669 Obesity, unspecified: Secondary | ICD-10-CM | POA: Diagnosis not present

## 2020-04-28 DIAGNOSIS — Z713 Dietary counseling and surveillance: Secondary | ICD-10-CM | POA: Diagnosis not present

## 2020-04-30 ENCOUNTER — Encounter: Payer: Federal, State, Local not specified - PPO | Admitting: Rehabilitative and Restorative Service Providers"

## 2020-04-30 ENCOUNTER — Other Ambulatory Visit: Payer: Self-pay

## 2020-05-04 ENCOUNTER — Ambulatory Visit: Payer: Federal, State, Local not specified - PPO | Admitting: Physical Therapy

## 2020-05-04 ENCOUNTER — Other Ambulatory Visit: Payer: Self-pay

## 2020-05-04 ENCOUNTER — Encounter: Payer: Self-pay | Admitting: Physical Therapy

## 2020-05-04 DIAGNOSIS — M6281 Muscle weakness (generalized): Secondary | ICD-10-CM | POA: Diagnosis not present

## 2020-05-04 DIAGNOSIS — G8929 Other chronic pain: Secondary | ICD-10-CM | POA: Diagnosis not present

## 2020-05-04 DIAGNOSIS — R29898 Other symptoms and signs involving the musculoskeletal system: Secondary | ICD-10-CM

## 2020-05-04 DIAGNOSIS — M25562 Pain in left knee: Secondary | ICD-10-CM | POA: Diagnosis not present

## 2020-05-04 DIAGNOSIS — M25561 Pain in right knee: Secondary | ICD-10-CM | POA: Diagnosis not present

## 2020-05-05 NOTE — Patient Instructions (Addendum)
Access Code: [email or web address]: [email or web address]: 06/29/2021Prepared by: Langley Gauss RobertsonExercises  Gastroc Stretch on Wall - 1 x daily - 2 x weekly - 1 reps - 30 seconds hold  Gastroc Stretch with Foot at [facility] - 1 x daily - 2 x weekly - 1 reps - 30 seconds hold  Forward Walking - 1 x daily - 2 x weekly - 4 sets  Backward Walking - 1 x daily - 2 x weekly - 4 sets  Side Stepping with Hand Floats - 1 x daily - 2 x weekly - 4 sets  Standing March at [facility] - 1 x daily - 2 x weekly - 15 reps  Standing Hip Abduction Adduction at [facility] - 1 x daily - 2 x weekly - 15 reps  Standing Hip Flexion Extension at [employer] - 1 x daily - 2 x weekly - 15 reps  Standing Hip Circles at [employer] - 1 x daily - 2 x weekly - 15 reps  Heel Toe Raises at Pool Wall - 1 x daily - 2 x weekly - 10 reps  Squat - 1 x daily - 2 x weekly - 10 reps  Standing Hip Adduction with Unilateral Counter Support - 1 x daily - 2 x weekly - 15 reps  Floating - 1 x daily - 2 x weekly - 10 reps  Soothing - 1 x daily - 2 x weekly - 10 reps  Enclosing - 1 x daily - 2 x weekly - 10 reps

## 2020-05-05 NOTE — Therapy (Signed)
[facility] [address] [place], [place], [address] Phone: [phone]   Fax:  [phone]  Physical Therapy Treatment  Patient Details  Name: Carol Franklin MRN: [id number] Date of Birth: 1961-05-05 Referring Provider (PT): Samuel Bouche, NP   Encounter Date: 05/04/2020   PT End of Session - 05/04/20 1954    Visit Number 14    Number of Visits 20    Date for PT Re-Evaluation 05/18/20    PT Start Time 9357    PT Stop Time 1630    PT Time Calculation (min) 45 min    Equipment Utilized During Treatment Other (comment)   ankle buoyancy cuffs, hand bar bells   Activity Tolerance Patient tolerated treatment well    Behavior During Therapy [facility] for tasks assessed/performed           Past Medical History:  Diagnosis Date  . Hypertension   . Intertrigo   . Post-operative nausea and vomiting   . Reactive thrombocytosis 2011    Past Surgical History:  Procedure Laterality Date  . CESAREAN SECTION     X2  . DILATION AND CURETTAGE OF UTERUS    . KNEE SURGERY     left    There were no vitals filed for this visit.   Subjective Assessment - 05/04/20 1953    Subjective Pt reports increased stiffness today as she did a lot of walking over the weekend.  Still needs to check into facilities to do aquatics on her own.    Currently in Pain? No/denies           Aquatic therapy at [facility] - pool temp 87.4degrees  Patient seen for aquatic therapy today. Treatment took place in water 3.5-4 feet deep depending upon activity. Pt entered and exited the pool viarampwithsupervision:   Runners stretch bil LE's x 30 seconds eachand toes up wall x 30 sec. Hamstring stretch at pool rail x 30 sec each side.  Performed at beginning and end of session.  Pt performed gait training in pool forwards 81mx2 reps then 2repsworking on hands underwater for armswing, 27m2 reps backwards,2568m2 repsside stepping, 51m22m  performing side step squat withhand bar bells.Forward marching x 51m 71m  Performedbil LE exercises with ankle buoyancy cuffs-marching, hip extension, hip abd, hamstring curl, hip flexion moving into hip extension, hip add across midline using wall as target, straight leg circles both directions. Pt able to perform 15repsalternating sides. Pt using pool edge for UE supportfor most of exercises.   Ai Chi posture offloating,enclosing,soothingin semi squat positon x 10reps.    Pt requires buoyancy for support with balance and viscosity for resistance for strengthening exercises; buoyancy is also needed for off loading body to assist with exercises.    PT Education - 05/05/20 1119    Education Details Options for aquatic facilities for her HEP, Aquatic HEP    Person(s) Educated Patient    Methods Explanation;Demonstration;Other (comment)   Emailed Medbridge link to HEP per pt request   Comprehension Verbalized understanding               PT Long Term Goals - 05/04/20 1957      PT LONG TERM GOAL #1   Title The patient will be independent with HEP for bilateral LE strengthening and stretching.    Time 6    Period Weeks    Status Revised      PT LONG TERM GOAL #2   Title  The patient will reduce functional limitation per [facility]OTO from 56% limitation to < or equal to 37% limitation.    Time 6    Period Weeks    Status Revised      PT LONG TERM GOAL #3   Title The patient will improve L knee flexion ROM to 8 to 100 degrees.    Baseline 6-93 degrees available L knee ROM    Time 6    Period Weeks    Status Revised      PT LONG TERM GOAL #4   Title The patient will improve R knee flexion ROM to 6 to 100 degrees.    Baseline 6-108 degrees AROM R knee    Time 6    Period Weeks    Status Achieved      PT LONG TERM GOAL #5   Title The patient will be able to verbalize understanding of aquatic program for ongoing fitness post d/c from PT    Time 6    Period  Weeks    Status Achieved                 Plan - 05/04/20 1955    Clinical Impression Statement Pt has progressed well with her aquatic therapy session.  Pt has not looked into facilities to perform aquatics on her own but have provided pt with aquatic HEP through [facility].  Pt has met LTG #5.  Discontinue aquatic PT due to goal met.  Pt currently does not have any land appointments scheduled but states she is planning on contacting clinic to schedule.    Personal Factors and Comorbidities Time since onset of injury/illness/exacerbation    Examination-Activity Limitations Squat;Stairs;Locomotion Level;Sit    Examination-Participation Restrictions Community Activity    Rehab Potential Good    PT Frequency 2x / week    PT Duration 6 weeks    PT Treatment/Interventions ADLs/Self Care Home Management;Gait training;Stair training;Functional mobility training;Therapeutic activities;Therapeutic exercise;Aquatic Therapy;Cryotherapy;Electrical Stimulation;Iontophoresis 34m/ml Dexamethasone;Moist Heat;Ultrasound;Taping;Patient/family education;Passive range of motion;Dry needling;Manual techniques    PT Next Visit Plan continue progressive bilat knee ROM and strengthening    PT Home Exercise Plan Access code: [email or web address]: [email or web address] for Aquatic HEP (emailed to pt vial Medbridge per pt request)    Consulted and Agree with Plan of Care Patient           Patient will benefit from skilled therapeutic intervention in order to improve the following deficits and impairments:  Abnormal gait, Decreased range of motion, Decreased strength, Postural dysfunction, Impaired flexibility, Pain, Hypomobility  Visit Diagnosis: Chronic pain of left knee  Chronic pain of right knee  Muscle weakness (generalized)  Other symptoms and signs involving the musculoskeletal system     Problem List Patient Active Problem List   Diagnosis Date Noted  . Eczema 02/14/2020  . Weight gain 02/14/2020    . Chronic pain of both knees 01/13/2020  . Osteopenia 12/04/2015  . Menopausal hot flushes 09/17/2014  . Essential thrombocytosis ([place] 01/13/2012  . Hyperlipidemia 01/13/2012  . 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/2012  . BMI 35.0-35.9,adult 01/06/2012    DNarda Bonds PTA [place]06/29/21 11:27 AM Phone: [phone]: [address] [address]Phone: [phone]  Fax:  [phone] Name: Carol BOOHERMRN: [id number] of Birth: 606/17/1962

## 2020-05-12 DIAGNOSIS — E669 Obesity, unspecified: Secondary | ICD-10-CM | POA: Diagnosis not present

## 2020-05-12 DIAGNOSIS — Z723 Lack of physical exercise: Secondary | ICD-10-CM | POA: Diagnosis not present

## 2020-05-12 DIAGNOSIS — Z724 Inappropriate diet and eating habits: Secondary | ICD-10-CM | POA: Diagnosis not present

## 2020-05-12 DIAGNOSIS — Z713 Dietary counseling and surveillance: Secondary | ICD-10-CM | POA: Diagnosis not present

## 2020-05-13 ENCOUNTER — Encounter: Payer: Federal, State, Local not specified - PPO | Admitting: Physical Therapy

## 2020-05-18 ENCOUNTER — Ambulatory Visit (INDEPENDENT_AMBULATORY_CARE_PROVIDER_SITE_OTHER): Payer: Federal, State, Local not specified - PPO | Admitting: Rehabilitative and Restorative Service Providers"

## 2020-05-18 ENCOUNTER — Other Ambulatory Visit: Payer: Self-pay

## 2020-05-18 ENCOUNTER — Encounter: Payer: Self-pay | Admitting: Rehabilitative and Restorative Service Providers"

## 2020-05-18 DIAGNOSIS — R29898 Other symptoms and signs involving the musculoskeletal system: Secondary | ICD-10-CM | POA: Diagnosis not present

## 2020-05-18 DIAGNOSIS — M25562 Pain in left knee: Secondary | ICD-10-CM | POA: Diagnosis not present

## 2020-05-18 DIAGNOSIS — M6281 Muscle weakness (generalized): Secondary | ICD-10-CM

## 2020-05-18 DIAGNOSIS — M25561 Pain in right knee: Secondary | ICD-10-CM

## 2020-05-18 DIAGNOSIS — G8929 Other chronic pain: Secondary | ICD-10-CM

## 2020-05-18 NOTE — Therapy (Signed)
[facility] [place], [place], [address] Phone: [phone]   Fax:  [phone]  Physical Therapy Treatment and Discharge Summary  Patient Details  Name: Carol Franklin MRN: [id number] Date of Birth: 12-Nov-1960 Referring Provider (PT): Samuel Bouche, NP   Encounter Date: 05/18/2020   PT End of Session - 05/18/20 0815    Visit Number 15    Number of Visits 20    Date for PT Re-Evaluation 05/18/20    PT Start Time 0805    PT Stop Time 0845    PT Time Calculation (min) 40 min    Equipment Utilized During Treatment Other (comment)    Activity Tolerance Patient tolerated treatment well    Behavior During Therapy [facility] for tasks assessed/performed           Past Medical History:  Diagnosis Date  . Hypertension   . Intertrigo   . Post-operative nausea and vomiting   . Reactive thrombocytosis 2011    Past Surgical History:  Procedure Laterality Date  . CESAREAN SECTION     X2  . DILATION AND CURETTAGE OF UTERUS    . KNEE SURGERY     left    There were no vitals filed for this visit.   Subjective Assessment - 05/18/20 0806    Subjective The patient reports she knows the [facility] schedule for when she can do open swim time to do her exercises.  She is planning to go at 8am and participate in a class.  Fourth of July weekend was busy for her at work.  She walks the building with tennis shoes donned.   Being busy at work flares her low back and knees.  She notes she hasn't had time to do ther ex due to work schedule.    Patient Stated Goals Reduce knee pain.    Currently in Pain? Yes    Pain Score 6     Pain Location Knee    Pain Orientation Right;Left    Pain Descriptors / Indicators Sore   "a deep soreness"   Pain Type Chronic pain    Pain Onset More than a month ago    Pain Frequency Intermittent              [facility] PT Assessment - 05/18/20 0001      Assessment   Medical Diagnosis chronic pain of both  knees    Referring Provider (PT) Samuel Bouche, NP    Onset Date/Surgical Date 02/14/20      AROM   Right Knee Extension -6    Right Knee Flexion 105                         [facility] Adult PT Treatment/Exercise - 05/18/20 8177      Ambulation/Gait   Ambulation/Gait Yes    Ambulation/Gait Assistance 7: Independent    Gait Comments patient is demonstrating improved gait mechanics without antalgia      Self-Care   Self-Care Other Self-Care Comments    Other Self-Care Comments  discussed scheduling time for aquatics and for therapeutic exercise, discussed after flare up to return to gentle walking and stretching and then progress back to strengthening as tolerance improves      Exercises   Exercises Knee/Hip      Knee/Hip Exercises: Stretches   Active Hamstring Stretch Right;Left;1 rep;20 seconds    Hip Flexor Stretch Right;Left;1 rep;60 seconds      Knee/Hip Exercises:  Prone   Hip Extension Right;Left;1 set;Strengthening;10 reps    Prone Knee Hang Limitations discussed adding weight to home program for greater stretch                  PT Education - 05/18/20 0846    Education Details HEP:  updated aquatics, updated/reviewed land HEP; discussed water walking    Person(s) Educated Patient    Methods Explanation;Demonstration;Handout    Comprehension Returned demonstration;Verbalized understanding               PT Long Term Goals - 05/18/20 0816      PT LONG TERM GOAL #1   Title The patient will be independent with HEP for bilateral LE strengthening and stretching.    Time 6    Period Weeks    Status Achieved    Target Date 05/18/20      PT LONG TERM GOAL #2   Title The patient will reduce functional limitation per [facility]OTO from 56% limitation to < or equal to 37% limitation.    Time 6    Period Weeks    Status Partially Met      PT LONG TERM GOAL #3   Title The patient will improve L knee flexion ROM to 8 to 100 degrees.    Baseline 6-93 degrees  available L knee ROM (at last measurement)    Time 6    Period Weeks    Status Partially Met      PT LONG TERM GOAL #4   Title The patient will improve R knee flexion ROM to 6 to 100 degrees.    Baseline 6-105 degrees AROM on 05/18/20 (after a flare up)    Time 6    Period Weeks    Status Partially Met      PT LONG TERM GOAL #5   Title The patient will be able to verbalize understanding of aquatic program for ongoing fitness post d/c from PT    Time 6    Period Weeks    Status Achieved                 Plan - 05/18/20 0847    Clinical Impression Statement The patient has partially met LTGs. She improves and then has weeks where she gets increased pain due to dec'd time for ther ex and being busier (on her feet more) at work.  PT encouraged continued participation in HEP after discharge.    Personal Factors and Comorbidities Time since onset of injury/illness/exacerbation    Examination-Activity Limitations Squat;Stairs;Locomotion Level;Sit    Examination-Participation Restrictions Community Activity    Rehab Potential Good    PT Frequency 2x / week    PT Duration 6 weeks    PT Treatment/Interventions ADLs/Self Care Home Management;Gait training;Stair training;Functional mobility training;Therapeutic activities;Therapeutic exercise;Aquatic Therapy;Cryotherapy;Electrical Stimulation;Iontophoresis 27m/ml Dexamethasone;Moist Heat;Ultrasound;Taping;Patient/family education;Passive range of motion;Dry needling;Manual techniques    PT Next Visit Plan discharge today    PT Home Exercise Plan Access code: [email or web address]: [email or web address] for Aquatic HEP (emailed to pt vial Medbridge per pt request)    Consulted and Agree with Plan of Care Patient           Patient will benefit from skilled therapeutic intervention in order to improve the following deficits and impairments:  Abnormal gait, Decreased range of motion, Decreased strength, Postural dysfunction, Impaired flexibility,  Pain, Hypomobility  Visit Diagnosis: Chronic pain of left knee  Chronic pain of right knee  Muscle weakness (generalized)  Other symptoms and signs  involving the musculoskeletal system    PHYSICAL THERAPY DISCHARGE SUMMARY  Visits from Start of Care: 12  Current functional level related to goals / functional outcomes: See goals above    Remaining deficits: Patient has chronic knee pain from arthritis.  She continues with dec'd ROM. Patient will continue to work on aquatic and land based there ex.   Education / Equipment: Home program.  Plan: Patient agrees to discharge.  Patient goals were partially met. Patient is being discharged due to meeting the stated rehab goals.  ?????         Thank you for the referral of this patient. Rudell Cobb, MPT   [place], PT 05/18/2020, 12:54 PM  [facility] [place], [place], [address] Phone: [phone]   Fax:  [phone]  Name: Carol Franklin MRN: [id number] Date of Birth: 03-14-1961

## 2020-05-18 NOTE — Patient Instructions (Signed)
Access Code: [email or web address] URL: [email or web address] Date: 05/18/2020 Prepared by: Rudell Cobb  Exercises Gastroc Stretch on Wall - 2 x daily - 7 x weekly - 1 sets - 2 reps - 20 seconds hold Seated Hamstring Stretch - 2 x daily - 7 x weekly - 1 sets - 2 reps - 20 seconds hold [place] Stretch on Table - 2 x daily - 7 x weekly - 1 sets - 10 reps Sidelying Hip Abduction - 2 x daily - 7 x weekly - 10 reps - 1 sets Prone Hip Extension with Bent Knee - One Pillow - 2 x daily - 7 x weekly - 1 sets - 10 reps Prone Knee Extension with Ankle Weight - 2 x daily - 7 x weekly - 1 sets - 1 reps - 3 minutes hold

## 2020-06-01 ENCOUNTER — Encounter: Payer: Self-pay | Admitting: Medical-Surgical

## 2020-09-04 ENCOUNTER — Other Ambulatory Visit: Payer: Self-pay | Admitting: Medical-Surgical

## 2020-09-04 DIAGNOSIS — Z1231 Encounter for screening mammogram for malignant neoplasm of breast: Secondary | ICD-10-CM

## 2020-10-12 ENCOUNTER — Ambulatory Visit
Admission: RE | Admit: 2020-10-12 | Discharge: 2020-10-12 | Disposition: A | Discharge status: Home / Self Care | Payer: Federal, State, Local not specified - PPO | Source: Ambulatory Visit | Attending: Medical-Surgical | Admitting: Medical-Surgical

## 2020-10-12 ENCOUNTER — Other Ambulatory Visit: Payer: Self-pay

## 2020-10-12 DIAGNOSIS — Z1231 Encounter for screening mammogram for malignant neoplasm of breast: Secondary | ICD-10-CM

## 2020-10-21 ENCOUNTER — Other Ambulatory Visit: Payer: Self-pay

## 2020-10-21 ENCOUNTER — Ambulatory Visit (INDEPENDENT_AMBULATORY_CARE_PROVIDER_SITE_OTHER): Payer: Federal, State, Local not specified - PPO | Admitting: Medical-Surgical

## 2020-10-21 ENCOUNTER — Encounter: Payer: Self-pay | Admitting: Medical-Surgical

## 2020-10-21 VITALS — BP 129/91 | HR 81 | Temp 98.7°F | Ht 61.0 in | Wt 183.6 lb

## 2020-10-21 DIAGNOSIS — M5441 Lumbago with sciatica, right side: Secondary | ICD-10-CM

## 2020-10-21 DIAGNOSIS — M25561 Pain in right knee: Secondary | ICD-10-CM

## 2020-10-21 DIAGNOSIS — E6609 Other obesity due to excess calories: Secondary | ICD-10-CM

## 2020-10-21 DIAGNOSIS — Z6834 Body mass index (BMI) 34.0-34.9, adult: Secondary | ICD-10-CM

## 2020-10-21 DIAGNOSIS — G8929 Other chronic pain: Secondary | ICD-10-CM

## 2020-10-21 DIAGNOSIS — R5383 Other fatigue: Secondary | ICD-10-CM

## 2020-10-21 DIAGNOSIS — M25562 Pain in left knee: Secondary | ICD-10-CM

## 2020-10-21 DIAGNOSIS — R0683 Snoring: Secondary | ICD-10-CM

## 2020-10-21 HISTORY — DX: Other chronic pain: G89.29

## 2020-10-21 HISTORY — DX: Snoring: R06.83

## 2020-10-21 HISTORY — DX: Other fatigue: R53.83

## 2020-10-21 MED ORDER — MELOXICAM 15 MG PO TABS: 15.0000 mg | ORAL_TABLET | Freq: Every day | ORAL | 0 refills | Status: DC

## 2020-10-21 MED ORDER — PREDNISONE 50 MG PO TABS: 50.0000 mg | ORAL_TABLET | Freq: Every day | ORAL | 0 refills | Status: DC

## 2020-10-21 NOTE — Progress Notes (Signed)
Subjective:    CC: Knee pain  HPI: Pleasant 59 year old female presenting today with complaints of right knee pain along the lateral aspect that extends up the lateral right thigh.  Notes this feels like sciatica from her chronic back pain.  This is been hurting for approximately 6 weeks and continues to worsen.  She has not been taking anti-inflammatories or other prescribed medications and has not used other conservative measures such as heat/ice.  Notes that she is significantly fatigued, not feeling rested in the morning on awakening.  She does have quite a bit of stress on a daily basis.  Endorses loud snoring at night.    Is interested in discussing weight loss. Has gained approximately 10 pounds since her last visit here.  I reviewed the past medical history, family history, social history, surgical history, and allergies today and no changes were needed.  Please see the problem list section below in epic for further details.  Past Medical History: Past Medical History:  Diagnosis Date  . Hypertension   . Intertrigo   . Post-operative nausea and vomiting   . Reactive thrombocytosis 2011   Past Surgical History: Past Surgical History:  Procedure Laterality Date  . CESAREAN SECTION     X2  . DILATION AND CURETTAGE OF UTERUS    . KNEE SURGERY     left   Social History: Social History   Socioeconomic History  . Marital status: Married    Spouse name: Not on file  . Number of children: Not on file  . Years of education: Not on file  . Highest education level: Not on file  Occupational History  . Not on file  Tobacco Use  . Smoking status: Never Smoker  . Smokeless tobacco: Never Used  Vaping Use  . Vaping Use: Never used  Substance and Sexual Activity  . Alcohol use: No    Alcohol/week: 0.0 standard drinks    Comment: rare  . Drug use: No  . Sexual activity: Yes    Birth control/protection: Post-menopausal    Comment: 1st intercourse 89 yo-1 partner  Other  Topics Concern  . Not on file  Social History Narrative  . Not on file   Social Determinants of Health   Financial Resource Strain: Not on file  Food Insecurity: Not on file  Transportation Needs: Not on file  Physical Activity: Not on file  Stress: Not on file  Social Connections: Not on file   Family History: Family History  Problem Relation Age of Onset  . Hypertension Father   . Heart disease Father   . Breast cancer Maternal Aunt   . Thyroid disease Mother   . Diabetes Maternal Grandmother   . Cancer Paternal Grandmother        colon   Allergies: Allergies  Allergen Reactions  . Naproxen Anxiety    Increases heart rate/makes her feel faint   Medications: See med rec.  Review of Systems: See HPI for pertinent positives and negatives.   Objective:    General: Well Developed, well nourished, and in no acute distress.  Neuro: Alert and oriented x3.   HEENT: Normocephalic, atraumatic. Skin: Warm and dry. Cardiac: Regular rate and rhythm, no murmurs rubs or gallops, no lower extremity edema.  Respiratory: Clear to auscultation bilaterally. Not using accessory muscles, speaking in full sentences. MSK: No weakness of lower extremities, stable gait.  Limitations in range of motion with flexion of lumbar spine, flexion of hip.  Tenderness to palpation along the right  lateral knee along the IT band extending into the lateral thigh.  Impression and Recommendations:    1. Chronic pain of both knees Referring to physical therapy.  Meloxicam 15 mg daily.  Okay to use extra strength Tylenol as needed as well as heat/ice as tolerated. - Ambulatory referral to Physical Therapy  2. Chronic right-sided low back pain with right-sided sciatica Prednisone 50 mg burst x5 days.  After that start meloxicam 15 mg daily.  Referring to physical therapy. - Ambulatory referral to Physical Therapy  3. Fatigue/loud snoring Suspect sleep apnea in the setting of recent weight gain and class  I obesity.  Ordering home sleep study. - Home sleep test  4.  Class I obesity Diastolic blood pressure is a little high today and her pain level is counter productive to increasing activity.  Advised to allow for management of current pain level and then we will get back together to discuss weight loss options at the beginning of the year.  Return for discussion on weight loss early in the year. ___________________________________________ Clearnce Sorrel, DNP, APRN, FNP-BC Primary Care and [place]

## 2020-11-11 ENCOUNTER — Ambulatory Visit: Payer: Federal, State, Local not specified - PPO | Admitting: Physical Therapy

## 2020-11-13 ENCOUNTER — Encounter: Payer: Federal, State, Local not specified - PPO | Admitting: Medical-Surgical

## 2020-11-13 DIAGNOSIS — Z7689 Persons encountering health services in other specified circumstances: Secondary | ICD-10-CM

## 2021-03-12 ENCOUNTER — Other Ambulatory Visit: Payer: Self-pay

## 2021-03-12 ENCOUNTER — Encounter: Payer: Self-pay | Admitting: Medical-Surgical

## 2021-03-12 ENCOUNTER — Ambulatory Visit: Payer: Federal, State, Local not specified - PPO | Admitting: Medical-Surgical

## 2021-03-12 VITALS — BP 105/71 | HR 70 | Temp 98.6°F | Ht 61.0 in | Wt 181.0 lb

## 2021-03-12 DIAGNOSIS — R5383 Other fatigue: Secondary | ICD-10-CM | POA: Diagnosis not present

## 2021-03-12 DIAGNOSIS — L309 Dermatitis, unspecified: Secondary | ICD-10-CM

## 2021-03-12 DIAGNOSIS — E782 Mixed hyperlipidemia: Secondary | ICD-10-CM | POA: Diagnosis not present

## 2021-03-12 DIAGNOSIS — R635 Abnormal weight gain: Secondary | ICD-10-CM

## 2021-03-12 DIAGNOSIS — D473 Essential (hemorrhagic) thrombocythemia: Secondary | ICD-10-CM | POA: Diagnosis not present

## 2021-03-12 DIAGNOSIS — M5441 Lumbago with sciatica, right side: Secondary | ICD-10-CM

## 2021-03-12 DIAGNOSIS — G8929 Other chronic pain: Secondary | ICD-10-CM

## 2021-03-12 MED ORDER — TRIAMCINOLONE ACETONIDE 0.1 % EX CREA: 1.0000 "application " | TOPICAL_CREAM | Freq: Two times a day (BID) | CUTANEOUS | 2 refills | Status: DC

## 2021-03-12 MED ORDER — PHENTERMINE HCL 37.5 MG PO TABS: ORAL_TABLET | ORAL | 0 refills | Status: DC

## 2021-03-12 MED ORDER — MELOXICAM 15 MG PO TABS: 15.0000 mg | ORAL_TABLET | Freq: Every day | ORAL | 0 refills | Status: DC

## 2021-03-12 NOTE — Progress Notes (Signed)
Subjective:    CC: rash, weight gain  HPI: Pleasant 60 year old female presenting for the following:  Rash-history of eczema with intermittent flares.  Usually affects her bilateral upper extremities.  Over the past couple of weeks she has noted a pruritic papular rash on her inner forearms.  She was given a cream last year that helped resolve this quickly but she ran out.  Has been using over-the-counter options with little relief.  Weight gain-very concerned with her weight as she has been unable to lose any despite making lifestyle modifications and cutting her portion sizes.  Previous discussion regarding starting phentermine but she was not sure about it at that time.  Today, would like to try a weight loss medication.  Low back pain-this is a chronic issue for her but she is noted a flare recently with right sciatica.  Has plans to contact physical therapy to get back in with them.  Taking meloxicam 15 mg daily as needed.  She is due for recheck of labs for essential thrombocytosis and hyperlipidemia.  I reviewed the past medical history, family history, social history, surgical history, and allergies today and no changes were needed.  Please see the problem list section below in epic for further details.  Past Medical History: Past Medical History:  Diagnosis Date  . Hypertension   . Intertrigo   . Post-operative nausea and vomiting   . Reactive thrombocytosis 2011   Past Surgical History: Past Surgical History:  Procedure Laterality Date  . CESAREAN SECTION     X2  . DILATION AND CURETTAGE OF UTERUS    . KNEE SURGERY     left   Social History: Social History   Socioeconomic History  . Marital status: Married    Spouse name: Not on file  . Number of children: Not on file  . Years of education: Not on file  . Highest education level: Not on file  Occupational History  . Not on file  Tobacco Use  . Smoking status: Never Smoker  . Smokeless tobacco: Never Used   Vaping Use  . Vaping Use: Never used  Substance and Sexual Activity  . Alcohol use: No    Alcohol/week: 0.0 standard drinks    Comment: rare  . Drug use: No  . Sexual activity: Yes    Birth control/protection: Post-menopausal    Comment: 1st intercourse 38 yo-1 partner  Other Topics Concern  . Not on file  Social History Narrative  . Not on file   Social Determinants of Health   Financial Resource Strain: Not on file  Food Insecurity: Not on file  Transportation Needs: Not on file  Physical Activity: Not on file  Stress: Not on file  Social Connections: Not on file   Family History: Family History  Problem Relation Age of Onset  . Hypertension Father   . Heart disease Father   . Breast cancer Maternal Aunt   . Thyroid disease Mother   . Diabetes Maternal Grandmother   . Cancer Paternal Grandmother        colon   Allergies: Allergies  Allergen Reactions  . Naproxen Anxiety    Increases heart rate/makes her feel faint   Medications: See med rec.  Review of Systems: See HPI for pertinent positives and negatives.   Objective:    General: Well Developed, well nourished, and in no acute distress.  Neuro: Alert and oriented x3.  HEENT: Normocephalic, atraumatic.  Skin: Warm and dry.  Scattered light red papules to bilateral  forearms. Cardiac: Regular rate and rhythm, no murmurs rubs or gallops, no lower extremity edema.  Respiratory: Clear to auscultation bilaterally. Not using accessory muscles, speaking in full sentences.  Impression and Recommendations:    1. Weight gain Discussed lifestyle and dietary modifications that may help with her weight gain.  Reviewed medications that are available.  She would like to start phentermine so sending that in for 1/2 tablet daily.  Advised of possible side effects.  Recommend increasing daily water intake to prevent constipation.  2. Eczema, unspecified type Triamcinolone cream twice daily as needed. - triamcinolone  cream (KENALOG) 0.1 %; Apply 1 application topically 2 (two) times daily.  Dispense: 30 g; Refill: 2  3. Mixed hyperlipidemia Checking lipid panel. - Lipid panel  4. Essential thrombocytosis (HCC) Checking CBC. - CBC  5. Fatigue, unspecified type Checking CMP. - COMPLETE METABOLIC PANEL WITH GFR  6. Chronic right-sided low back pain with right-sided sciatica Continue meloxicam 15 mg daily.  Contact physical therapy to get restarted.  Return in about 4 weeks (around 04/09/2021) for weight check. ___________________________________________ Clearnce Sorrel, DNP, APRN, FNP-BC Primary Care and [place]

## 2021-03-13 LAB — COMPLETE METABOLIC PANEL WITH GFR
AG Ratio: 1.3 (calc) (ref 1.0–2.5)
ALT: 12 U/L (ref 6–29)
AST: 18 U/L (ref 10–35)
Albumin: 4.2 g/dL (ref 3.6–5.1)
Alkaline phosphatase (APISO): 74 U/L (ref 37–153)
BUN: 10 mg/dL (ref 7–25)
CO2: 25 mmol/L (ref 20–32)
Calcium: 9.7 mg/dL (ref 8.6–10.4)
Chloride: 106 mmol/L (ref 98–110)
Creat: 0.65 mg/dL (ref 0.50–1.05)
GFR, Est African American: 113 mL/min/{1.73_m2} (ref >=60)
GFR, Est Non African American: 98 mL/min/{1.73_m2} (ref >=60)
Globulin: 3.3 g/dL (calc) (ref 1.9–3.7)
Glucose, Bld: 91 mg/dL (ref 65–99)
Potassium: 3.9 mmol/L (ref 3.5–5.3)
Sodium: 140 mmol/L (ref 135–146)
Total Bilirubin: 0.6 mg/dL (ref 0.2–1.2)
Total Protein: 7.5 g/dL (ref 6.1–8.1)

## 2021-03-13 LAB — CBC
HCT: 42.4 % (ref 35.0–45.0)
Hemoglobin: 14 g/dL (ref 11.7–15.5)
MCH: 30 pg (ref 27.0–33.0)
MCHC: 33 g/dL (ref 32.0–36.0)
MCV: 90.8 fL (ref 80.0–100.0)
MPV: 10.2 fL (ref 7.5–12.5)
Platelets: 461 10*3/uL — ABNORMAL HIGH (ref 140–400)
RBC: 4.67 10*6/uL (ref 3.80–5.10)
RDW: 12.8 % (ref 11.0–15.0)
WBC: 7.8 10*3/uL (ref 3.8–10.8)

## 2021-03-13 LAB — LIPID PANEL
Cholesterol: 192 mg/dL (ref <200)
HDL: 64 mg/dL (ref >=50)
LDL Cholesterol (Calc): 111 mg/dL (calc) — ABNORMAL HIGH
Non-HDL Cholesterol (Calc): 128 mg/dL (calc) (ref <130)
Total CHOL/HDL Ratio: 3 (calc) (ref <5.0)
Triglycerides: 76 mg/dL (ref <150)

## 2021-04-19 ENCOUNTER — Ambulatory Visit: Payer: Federal, State, Local not specified - PPO | Admitting: Medical-Surgical

## 2021-04-19 ENCOUNTER — Other Ambulatory Visit: Payer: Self-pay

## 2021-04-19 ENCOUNTER — Encounter: Payer: Self-pay | Admitting: Medical-Surgical

## 2021-04-19 VITALS — BP 117/82 | HR 86 | Temp 97.9°F | Ht 61.0 in | Wt 182.1 lb

## 2021-04-19 DIAGNOSIS — Z6834 Body mass index (BMI) 34.0-34.9, adult: Secondary | ICD-10-CM | POA: Diagnosis not present

## 2021-04-19 DIAGNOSIS — R635 Abnormal weight gain: Secondary | ICD-10-CM | POA: Diagnosis not present

## 2021-04-19 DIAGNOSIS — E6609 Other obesity due to excess calories: Secondary | ICD-10-CM

## 2021-04-19 NOTE — Progress Notes (Signed)
Subjective:    CC: weight check  HPI: Pleasant 60 year old female presenting for weight check on phentermine. Has been taking 1/2 tab as prescribed but admits that dosing has been hit or miss. She felt like she lost about 5lbs but then went on a vacation to [place]. Returned to her job and regular activities yesterday. Was trying to make healthier dietary choices but ate lavishly on her vacation. Did not take the phentermine while on vacation.  Denies side effects while taking the medication.  Has had no palpitations or constipation.  Since returning from her vacation, she has actually experienced diarrhea up to 5 times per day.   I reviewed the past medical history, family history, social history, surgical history, and allergies today and no changes were needed.  Please see the problem list section below in epic for further details.  Past Medical History: Past Medical History:  Diagnosis Date   Hypertension    Intertrigo    Post-operative nausea and vomiting    Reactive thrombocytosis 2011   Past Surgical History: Past Surgical History:  Procedure Laterality Date   CESAREAN SECTION     X2   DILATION AND CURETTAGE OF UTERUS     KNEE SURGERY     left   Social History: Social History   Socioeconomic History   Marital status: Married    Spouse name: Not on file   Number of children: Not on file   Years of education: Not on file   Highest education level: Not on file  Occupational History   Not on file  Tobacco Use   Smoking status: Never   Smokeless tobacco: Never  Vaping Use   Vaping Use: Never used  Substance and Sexual Activity   Alcohol use: No    Alcohol/week: 0.0 standard drinks    Comment: rare   Drug use: No   Sexual activity: Yes    Birth control/protection: Post-menopausal    Comment: 1st intercourse 24 yo-1 partner  Other Topics Concern   Not on file  Social History Narrative   Not on file   Social Determinants of Health   Financial Resource Strain:  Not on file  Food Insecurity: Not on file  Transportation Needs: Not on file  Physical Activity: Not on file  Stress: Not on file  Social Connections: Not on file   Family History: Family History  Problem Relation Age of Onset   Hypertension Father    Heart disease Father    Breast cancer Maternal Aunt    Thyroid disease Mother    Diabetes Maternal Grandmother    Cancer Paternal Grandmother        colon   Allergies: Allergies  Allergen Reactions   Naproxen Anxiety    Increases heart rate/makes her feel faint   Medications: See med rec.  Review of Systems: See HPI for pertinent positives and negatives.   Objective:    General: Well Developed, well nourished, and in no acute distress.  Neuro: Alert and oriented x3.  HEENT: Normocephalic, atraumatic.  Skin: Warm and dry. Cardiac: Regular rate and rhythm, no murmurs rubs or gallops, no lower extremity edema.  Respiratory: Clear to auscultation bilaterally. Not using accessory muscles, speaking in full sentences.  Impression and Recommendations:    1. Weight gain 2. Class 1 obesity due to excess calories without serious comorbidity with body mass index (BMI) of 34.0 to 34.9 in adult No weight loss this last 4 weeks but dosing was inconsistent and she had a vacation with  poor dietary habits. Continue phentermine as prescribed but stressed the importance of regular dosing and dietary modification. Start regular exercise including strength training and cardio.   Return in about 4 weeks (around 05/17/2021) for weight check. ___________________________________________ Clearnce Sorrel, DNP, APRN, FNP-BC Primary Care and [place]

## 2021-04-24 ENCOUNTER — Other Ambulatory Visit: Payer: Self-pay | Admitting: Medical-Surgical

## 2021-05-14 ENCOUNTER — Ambulatory Visit: Payer: Federal, State, Local not specified - PPO | Admitting: Medical-Surgical

## 2021-09-16 ENCOUNTER — Other Ambulatory Visit: Payer: Self-pay | Admitting: Medical-Surgical

## 2021-09-16 DIAGNOSIS — Z1231 Encounter for screening mammogram for malignant neoplasm of breast: Secondary | ICD-10-CM

## 2021-10-22 ENCOUNTER — Ambulatory Visit: Payer: Federal, State, Local not specified - PPO

## 2021-11-04 ENCOUNTER — Ambulatory Visit
Admission: RE | Admit: 2021-11-04 | Discharge: 2021-11-04 | Disposition: A | Discharge status: Home / Self Care | Payer: Federal, State, Local not specified - PPO | Source: Ambulatory Visit | Attending: Medical-Surgical | Admitting: Medical-Surgical

## 2021-11-04 DIAGNOSIS — Z1231 Encounter for screening mammogram for malignant neoplasm of breast: Secondary | ICD-10-CM

## 2021-11-19 ENCOUNTER — Ambulatory Visit: Payer: Federal, State, Local not specified - PPO

## 2021-11-19 DIAGNOSIS — M170 Bilateral primary osteoarthritis of knee: Secondary | ICD-10-CM | POA: Diagnosis not present

## 2021-12-09 ENCOUNTER — Encounter: Payer: Federal, State, Local not specified - PPO | Admitting: Medical-Surgical

## 2022-03-26 DIAGNOSIS — M5441 Lumbago with sciatica, right side: Secondary | ICD-10-CM | POA: Diagnosis not present

## 2022-03-26 DIAGNOSIS — M25551 Pain in right hip: Secondary | ICD-10-CM | POA: Diagnosis not present

## 2022-03-26 DIAGNOSIS — M9905 Segmental and somatic dysfunction of pelvic region: Secondary | ICD-10-CM | POA: Diagnosis not present

## 2022-03-26 DIAGNOSIS — M9903 Segmental and somatic dysfunction of lumbar region: Secondary | ICD-10-CM | POA: Diagnosis not present

## 2022-03-28 DIAGNOSIS — M9903 Segmental and somatic dysfunction of lumbar region: Secondary | ICD-10-CM | POA: Diagnosis not present

## 2022-03-28 DIAGNOSIS — M9905 Segmental and somatic dysfunction of pelvic region: Secondary | ICD-10-CM | POA: Diagnosis not present

## 2022-03-28 DIAGNOSIS — M5441 Lumbago with sciatica, right side: Secondary | ICD-10-CM | POA: Diagnosis not present

## 2022-03-28 DIAGNOSIS — M25551 Pain in right hip: Secondary | ICD-10-CM | POA: Diagnosis not present

## 2022-03-30 ENCOUNTER — Ambulatory Visit (INDEPENDENT_AMBULATORY_CARE_PROVIDER_SITE_OTHER): Payer: Federal, State, Local not specified - PPO

## 2022-03-30 ENCOUNTER — Encounter: Payer: Self-pay | Admitting: Sports Medicine

## 2022-03-30 ENCOUNTER — Ambulatory Visit: Payer: Federal, State, Local not specified - PPO | Admitting: Sports Medicine

## 2022-03-30 DIAGNOSIS — R102 Pelvic and perineal pain unspecified side: Secondary | ICD-10-CM | POA: Insufficient documentation

## 2022-03-30 DIAGNOSIS — M160 Bilateral primary osteoarthritis of hip: Secondary | ICD-10-CM | POA: Diagnosis not present

## 2022-03-30 HISTORY — DX: Pelvic and perineal pain: R10.2

## 2022-03-30 LAB — POCT URINALYSIS DIP (CLINITEK)
Bilirubin, UA: NEGATIVE
Blood, UA: NEGATIVE
Glucose, UA: NEGATIVE mg/dL
Ketones, POC UA: NEGATIVE mg/dL
Leukocytes, UA: NEGATIVE
Nitrite, UA: NEGATIVE
POC PROTEIN,UA: NEGATIVE
Spec Grav, UA: 1.015 (ref 1.010–1.025)
Urobilinogen, UA: 0.2 E.U./dL
pH, UA: 7 (ref 5.0–8.0)

## 2022-03-30 MED ORDER — MELOXICAM 15 MG PO TABS: ORAL_TABLET | ORAL | 3 refills | Status: DC

## 2022-03-30 NOTE — Assessment & Plan Note (Addendum)
Pleasant 61 year old female, approximately 5 or 6 days of acute anterior pelvic pain, lower. She and her husband had intercourse for the first time in a while, after that she started develop the pain. Pain is localized directly over the pubic symphysis, no vaginal discharge, bleeding, she is voiding more frequently but does endorse drinking more water. No overt burning. Pain is worse when walking. Urinalysis was normal today, I would like x-rays, meloxicam. 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

## 2022-03-30 NOTE — Addendum Note (Signed)
Addended by: Andria Rhein L on: 03/30/2022 10:30 AM   Modules accepted: Orders

## 2022-03-30 NOTE — Progress Notes (Signed)
    Procedures performed today:    None.  Independent interpretation of notes and tests performed by another provider:   None.  Brief History, Exam, Impression, and Recommendations:    Pelvic pain Pleasant 61 year old female, approximately 5 or 6 days of acute anterior pelvic pain, lower. She and her husband had intercourse for the first time in a while, after that she started develop the pain. Pain is localized directly over the pubic symphysis, no vaginal discharge, bleeding, she is voiding more frequently but does endorse drinking more water. No overt burning. Pain is worse when walking. Urinalysis was normal today, I would like x-rays, meloxicam. 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    ___________________________________________ Gwen Her. Dianah Field, M.D., ABFM., CAQSM. Primary Care and [place] Instructor of [place] [facility]

## 2022-04-01 DIAGNOSIS — M25551 Pain in right hip: Secondary | ICD-10-CM | POA: Diagnosis not present

## 2022-04-01 DIAGNOSIS — M5441 Lumbago with sciatica, right side: Secondary | ICD-10-CM | POA: Diagnosis not present

## 2022-04-01 DIAGNOSIS — M9903 Segmental and somatic dysfunction of lumbar region: Secondary | ICD-10-CM | POA: Diagnosis not present

## 2022-04-01 DIAGNOSIS — M9905 Segmental and somatic dysfunction of pelvic region: Secondary | ICD-10-CM | POA: Diagnosis not present

## 2022-04-08 DIAGNOSIS — M9903 Segmental and somatic dysfunction of lumbar region: Secondary | ICD-10-CM | POA: Diagnosis not present

## 2022-04-08 DIAGNOSIS — M25551 Pain in right hip: Secondary | ICD-10-CM | POA: Diagnosis not present

## 2022-04-08 DIAGNOSIS — M9905 Segmental and somatic dysfunction of pelvic region: Secondary | ICD-10-CM | POA: Diagnosis not present

## 2022-04-08 DIAGNOSIS — M5441 Lumbago with sciatica, right side: Secondary | ICD-10-CM | POA: Diagnosis not present

## 2022-04-16 DIAGNOSIS — M9903 Segmental and somatic dysfunction of lumbar region: Secondary | ICD-10-CM | POA: Diagnosis not present

## 2022-04-16 DIAGNOSIS — M9905 Segmental and somatic dysfunction of pelvic region: Secondary | ICD-10-CM | POA: Diagnosis not present

## 2022-04-16 DIAGNOSIS — M25551 Pain in right hip: Secondary | ICD-10-CM | POA: Diagnosis not present

## 2022-04-16 DIAGNOSIS — M5441 Lumbago with sciatica, right side: Secondary | ICD-10-CM | POA: Diagnosis not present

## 2022-04-22 DIAGNOSIS — M5441 Lumbago with sciatica, right side: Secondary | ICD-10-CM | POA: Diagnosis not present

## 2022-04-22 DIAGNOSIS — M9903 Segmental and somatic dysfunction of lumbar region: Secondary | ICD-10-CM | POA: Diagnosis not present

## 2022-04-22 DIAGNOSIS — M9905 Segmental and somatic dysfunction of pelvic region: Secondary | ICD-10-CM | POA: Diagnosis not present

## 2022-04-22 DIAGNOSIS — M25551 Pain in right hip: Secondary | ICD-10-CM | POA: Diagnosis not present

## 2022-04-27 ENCOUNTER — Ambulatory Visit: Payer: Federal, State, Local not specified - PPO | Admitting: Sports Medicine

## 2022-04-27 DIAGNOSIS — R102 Pelvic and perineal pain unspecified side: Secondary | ICD-10-CM

## 2022-04-27 NOTE — Progress Notes (Signed)
    Procedures performed today:    None.  Independent interpretation of notes and tests performed by another provider:   None.  Brief History, Exam, Impression, and Recommendations:    Pubic symphysis degenerative changes Pleasant 61 year old female, I saw her about a month ago with acute anterior pelvic pain, pain was localized directly over the pubic symphysis, this was after an energetic episode of intercourse with her husband after a while. We treated her conservatively with meloxicam, we got some x-rays that showed pubic symphysis degenerative changes. Urinalysis that day was normal. She returns today feeling 100% better after conservative treatment, continue meloxicam as needed, return to see me as needed.    ___________________________________________ Gwen Her. Dianah Field, M.D., ABFM., CAQSM. Primary Care and [place] Instructor of [place] of [facility]

## 2022-04-27 NOTE — Assessment & Plan Note (Signed)
Pleasant 61 year old female, I saw her about a month ago with acute anterior pelvic pain, pain was localized directly over the pubic symphysis, this was after an energetic episode of intercourse with her husband after a while. We treated her conservatively with meloxicam, we got some x-rays that showed pubic symphysis degenerative changes. Urinalysis that day was normal. She returns today feeling 100% better after conservative treatment, continue meloxicam as needed, return to see me as needed.

## 2022-06-10 DIAGNOSIS — Z03818 Encounter for observation for suspected exposure to other biological agents ruled out: Secondary | ICD-10-CM | POA: Diagnosis not present

## 2022-06-10 DIAGNOSIS — Z20828 Contact with and (suspected) exposure to other viral communicable diseases: Secondary | ICD-10-CM | POA: Diagnosis not present

## 2022-07-09 DIAGNOSIS — M9905 Segmental and somatic dysfunction of pelvic region: Secondary | ICD-10-CM | POA: Diagnosis not present

## 2022-07-09 DIAGNOSIS — M9903 Segmental and somatic dysfunction of lumbar region: Secondary | ICD-10-CM | POA: Diagnosis not present

## 2022-07-09 DIAGNOSIS — M5441 Lumbago with sciatica, right side: Secondary | ICD-10-CM | POA: Diagnosis not present

## 2022-07-09 DIAGNOSIS — M25551 Pain in right hip: Secondary | ICD-10-CM | POA: Diagnosis not present

## 2022-07-13 DIAGNOSIS — M9903 Segmental and somatic dysfunction of lumbar region: Secondary | ICD-10-CM | POA: Diagnosis not present

## 2022-07-13 DIAGNOSIS — M9905 Segmental and somatic dysfunction of pelvic region: Secondary | ICD-10-CM | POA: Diagnosis not present

## 2022-07-13 DIAGNOSIS — M25551 Pain in right hip: Secondary | ICD-10-CM | POA: Diagnosis not present

## 2022-07-13 DIAGNOSIS — M5441 Lumbago with sciatica, right side: Secondary | ICD-10-CM | POA: Diagnosis not present

## 2022-07-15 DIAGNOSIS — M25551 Pain in right hip: Secondary | ICD-10-CM | POA: Diagnosis not present

## 2022-07-15 DIAGNOSIS — M5441 Lumbago with sciatica, right side: Secondary | ICD-10-CM | POA: Diagnosis not present

## 2022-07-15 DIAGNOSIS — M9905 Segmental and somatic dysfunction of pelvic region: Secondary | ICD-10-CM | POA: Diagnosis not present

## 2022-07-15 DIAGNOSIS — M9903 Segmental and somatic dysfunction of lumbar region: Secondary | ICD-10-CM | POA: Diagnosis not present

## 2022-08-04 ENCOUNTER — Other Ambulatory Visit: Payer: Self-pay | Admitting: Sports Medicine

## 2022-08-04 DIAGNOSIS — R102 Pelvic and perineal pain: Secondary | ICD-10-CM

## 2022-08-26 DIAGNOSIS — M9903 Segmental and somatic dysfunction of lumbar region: Secondary | ICD-10-CM | POA: Diagnosis not present

## 2022-08-26 DIAGNOSIS — M5441 Lumbago with sciatica, right side: Secondary | ICD-10-CM | POA: Diagnosis not present

## 2022-08-26 DIAGNOSIS — M25551 Pain in right hip: Secondary | ICD-10-CM | POA: Diagnosis not present

## 2022-08-26 DIAGNOSIS — M9905 Segmental and somatic dysfunction of pelvic region: Secondary | ICD-10-CM | POA: Diagnosis not present

## 2022-08-29 DIAGNOSIS — M5441 Lumbago with sciatica, right side: Secondary | ICD-10-CM | POA: Diagnosis not present

## 2022-08-29 DIAGNOSIS — M25551 Pain in right hip: Secondary | ICD-10-CM | POA: Diagnosis not present

## 2022-08-29 DIAGNOSIS — M9903 Segmental and somatic dysfunction of lumbar region: Secondary | ICD-10-CM | POA: Diagnosis not present

## 2022-08-29 DIAGNOSIS — M9905 Segmental and somatic dysfunction of pelvic region: Secondary | ICD-10-CM | POA: Diagnosis not present

## 2022-11-16 ENCOUNTER — Other Ambulatory Visit: Payer: Self-pay | Admitting: Medical-Surgical

## 2022-11-16 DIAGNOSIS — Z1231 Encounter for screening mammogram for malignant neoplasm of breast: Secondary | ICD-10-CM

## 2022-11-21 DIAGNOSIS — M9905 Segmental and somatic dysfunction of pelvic region: Secondary | ICD-10-CM | POA: Diagnosis not present

## 2022-11-21 DIAGNOSIS — M25551 Pain in right hip: Secondary | ICD-10-CM | POA: Diagnosis not present

## 2022-11-21 DIAGNOSIS — M9903 Segmental and somatic dysfunction of lumbar region: Secondary | ICD-10-CM | POA: Diagnosis not present

## 2022-11-21 DIAGNOSIS — M5441 Lumbago with sciatica, right side: Secondary | ICD-10-CM | POA: Diagnosis not present

## 2022-11-23 DIAGNOSIS — M25551 Pain in right hip: Secondary | ICD-10-CM | POA: Diagnosis not present

## 2022-11-23 DIAGNOSIS — M9905 Segmental and somatic dysfunction of pelvic region: Secondary | ICD-10-CM | POA: Diagnosis not present

## 2022-11-23 DIAGNOSIS — M5441 Lumbago with sciatica, right side: Secondary | ICD-10-CM | POA: Diagnosis not present

## 2022-11-23 DIAGNOSIS — M9903 Segmental and somatic dysfunction of lumbar region: Secondary | ICD-10-CM | POA: Diagnosis not present

## 2022-11-25 DIAGNOSIS — M5441 Lumbago with sciatica, right side: Secondary | ICD-10-CM | POA: Diagnosis not present

## 2022-11-25 DIAGNOSIS — M25551 Pain in right hip: Secondary | ICD-10-CM | POA: Diagnosis not present

## 2022-11-25 DIAGNOSIS — M9903 Segmental and somatic dysfunction of lumbar region: Secondary | ICD-10-CM | POA: Diagnosis not present

## 2022-11-25 DIAGNOSIS — M9905 Segmental and somatic dysfunction of pelvic region: Secondary | ICD-10-CM | POA: Diagnosis not present

## 2022-12-02 ENCOUNTER — Ambulatory Visit: Payer: Federal, State, Local not specified - PPO

## 2022-12-16 ENCOUNTER — Ambulatory Visit: Payer: Federal, State, Local not specified - PPO

## 2022-12-16 ENCOUNTER — Ambulatory Visit
Admission: RE | Admit: 2022-12-16 | Discharge: 2022-12-16 | Disposition: A | Discharge status: Home / Self Care | Payer: Federal, State, Local not specified - PPO | Source: Ambulatory Visit | Attending: Medical-Surgical | Admitting: Medical-Surgical

## 2022-12-16 DIAGNOSIS — Z1231 Encounter for screening mammogram for malignant neoplasm of breast: Secondary | ICD-10-CM

## 2022-12-22 ENCOUNTER — Encounter: Payer: Federal, State, Local not specified - PPO | Admitting: Medical-Surgical

## 2023-03-03 ENCOUNTER — Encounter: Payer: Self-pay | Admitting: Medical-Surgical

## 2023-03-03 ENCOUNTER — Ambulatory Visit (INDEPENDENT_AMBULATORY_CARE_PROVIDER_SITE_OTHER): Payer: Federal, State, Local not specified - PPO | Admitting: Medical-Surgical

## 2023-03-03 VITALS — BP 123/81 | HR 98 | Resp 20 | Ht 61.0 in | Wt 196.2 lb

## 2023-03-03 DIAGNOSIS — G8929 Other chronic pain: Secondary | ICD-10-CM

## 2023-03-03 DIAGNOSIS — R102 Pelvic and perineal pain: Secondary | ICD-10-CM

## 2023-03-03 DIAGNOSIS — Z1329 Encounter for screening for other suspected endocrine disorder: Secondary | ICD-10-CM | POA: Diagnosis not present

## 2023-03-03 DIAGNOSIS — Z636 Dependent relative needing care at home: Secondary | ICD-10-CM

## 2023-03-03 DIAGNOSIS — Z710 Person encountering health services to consult on behalf of another person: Secondary | ICD-10-CM | POA: Insufficient documentation

## 2023-03-03 DIAGNOSIS — Z131 Encounter for screening for diabetes mellitus: Secondary | ICD-10-CM | POA: Diagnosis not present

## 2023-03-03 DIAGNOSIS — E782 Mixed hyperlipidemia: Secondary | ICD-10-CM | POA: Diagnosis not present

## 2023-03-03 DIAGNOSIS — M25562 Pain in left knee: Secondary | ICD-10-CM

## 2023-03-03 DIAGNOSIS — D473 Essential (hemorrhagic) thrombocythemia: Secondary | ICD-10-CM

## 2023-03-03 DIAGNOSIS — Z23 Encounter for immunization: Secondary | ICD-10-CM

## 2023-03-03 DIAGNOSIS — Z0000 Encounter for general adult medical examination without abnormal findings: Secondary | ICD-10-CM | POA: Diagnosis not present

## 2023-03-03 DIAGNOSIS — Z01419 Encounter for gynecological examination (general) (routine) without abnormal findings: Secondary | ICD-10-CM

## 2023-03-03 DIAGNOSIS — Z01810 Encounter for preprocedural cardiovascular examination: Secondary | ICD-10-CM | POA: Insufficient documentation

## 2023-03-03 DIAGNOSIS — M25561 Pain in right knee: Secondary | ICD-10-CM

## 2023-03-03 HISTORY — DX: Person encountering health services to consult on behalf of another person: Z71.0

## 2023-03-03 MED ORDER — ESCITALOPRAM OXALATE 5 MG PO TABS: 5.0000 mg | ORAL_TABLET | Freq: Every day | ORAL | 3 refills | Status: DC

## 2023-03-03 MED ORDER — MELOXICAM 15 MG PO TABS
ORAL_TABLET | ORAL | 3 refills | Status: DC
Start: 2023-03-03 — End: 2023-03-28

## 2023-03-03 NOTE — Progress Notes (Signed)
Complete physical exam  Patient: Carol Franklin   DOB: [birth year]   61 y.o. Female  MRN: [id number]  Subjective:    Chief Complaint  Patient presents with   Annual Exam    Carol Franklin is a 62 y.o. female who presents today for a complete physical exam. She reports consuming a general diet. The patient does not participate in regular exercise at present. She generally feels fairly well. She reports sleeping fairly well. She does have additional problems to discuss today.    Most recent fall risk assessment:    04/19/2021    8:36 AM  Fall Risk   Falls in the past year? 0  Number falls in past yr: 0  Injury with Fall? 0  Follow up Falls evaluation completed     Most recent depression screenings:    04/19/2021    8:36 AM 02/14/2020   10:19 AM  PHQ 2/9 Scores  PHQ - 2 Score 0 1  PHQ- 9 Score 6 10    Vision:Within last year and Dental: No current dental problems and Receives regular dental care    Patient Care Team: Christen Butter, NP as PCP - General (Nurse Practitioner)   Outpatient Medications Prior to Visit  Medication Sig   triamcinolone cream (KENALOG) 0.1 % Apply 1 application topically 2 (two) times daily.   [DISCONTINUED] meloxicam (MOBIC) 15 MG tablet ONE TABLET BY MOUTH EVERY 24 HOURS WITH A MEAL FOR 2 WEEKS, THEN ONCE EVERY 24 HOURS AS NEEDED PAIN.   No facility-administered medications prior to visit.    Review of Systems  Constitutional:  Positive for malaise/fatigue. Negative for chills, fever and weight loss.  HENT:  Negative for congestion, ear pain, hearing loss, sinus pain and sore throat.   Eyes:  Negative for blurred vision, photophobia and pain.  Respiratory:  Negative for cough, shortness of breath and wheezing.   Cardiovascular:  Negative for chest pain, palpitations and leg swelling.  Gastrointestinal:  Negative for abdominal pain, constipation, diarrhea, heartburn, nausea and vomiting.  Genitourinary:  Negative for dysuria, frequency and  urgency.  Musculoskeletal:  Positive for joint pain. Negative for falls and neck pain.  Skin:  Negative for itching and rash.  Neurological:  Negative for dizziness, weakness and headaches.  Endo/Heme/Allergies:  Negative for polydipsia. Does not bruise/bleed easily.  Psychiatric/Behavioral:  Positive for depression. Negative for substance abuse and suicidal ideas. The patient is nervous/anxious and has insomnia.      Objective:    BP 123/81 (BP Location: Left Arm, Cuff Size: Large)   Pulse 98   Resp 20   Ht 5\' 1"  (1.549 m)   Wt 196 lb 3.2 oz (89 kg)   LMP 11/17/2015   SpO2 94%   BMI 37.07 kg/m    Physical Exam Constitutional:      General: She is not in acute distress.    Appearance: Normal appearance. She is not ill-appearing.  HENT:     Head: Normocephalic and atraumatic.     Right Ear: Tympanic membrane, ear canal and external ear normal. There is no impacted cerumen.     Left Ear: Tympanic membrane, ear canal and external ear normal. There is no impacted cerumen.     Nose: Nose normal. No congestion or rhinorrhea.     Mouth/Throat:     Mouth: Mucous membranes are moist.     Pharynx: No oropharyngeal exudate or posterior oropharyngeal erythema.  Eyes:     General: No scleral icterus.  Right eye: No discharge.        Left eye: No discharge.     Extraocular Movements: Extraocular movements intact.     Conjunctiva/sclera: Conjunctivae normal.     Pupils: Pupils are equal, round, and reactive to light.  Neck:     Thyroid: No thyromegaly.     Vascular: No carotid bruit or JVD.     Trachea: Trachea normal.  Cardiovascular:     Rate and Rhythm: Normal rate and regular rhythm.     Pulses: Normal pulses.     Heart sounds: Normal heart sounds. No murmur heard.    No friction rub. No gallop.  Pulmonary:     Effort: Pulmonary effort is normal. No respiratory distress.     Breath sounds: Normal breath sounds. No wheezing.  Abdominal:     General: Bowel sounds are  normal. There is no distension.     Palpations: Abdomen is soft.     Tenderness: There is no abdominal tenderness. There is no guarding.  Musculoskeletal:        General: Normal range of motion.     Cervical back: Normal range of motion and neck supple.  Lymphadenopathy:     Cervical: No cervical adenopathy.  Skin:    General: Skin is warm and dry.  Neurological:     Mental Status: She is alert and oriented to person, place, and time.     Cranial Nerves: No cranial nerve deficit.  Psychiatric:        Mood and Affect: Mood normal.        Behavior: Behavior normal.        Thought Content: Thought content normal.        Judgment: Judgment normal.      No results found for any visits on 03/03/23.     Assessment & Plan:    Routine Health Maintenance and Physical Exam  Immunization History  Administered Date(s) Administered   PFIZER(Purple Top)SARS-COV-2 Vaccination 01/29/2020, 02/19/2020   Tdap 01/13/2020    Health Maintenance  Topic Date Due   Zoster Vaccines- Shingrix (1 of 2) Never done   PAP SMEAR-Modifier  01/13/2018   COVID-19 Vaccine (3 - Pfizer risk series) 03/19/2023 (Originally 03/18/2020)   INFLUENZA VACCINE  06/08/2023   MAMMOGRAM  12/17/2023   COLONOSCOPY (Pts 45-22yrs Insurance coverage will need to be confirmed)  06/01/2025   DTaP/Tdap/Td (2 - Td or Tdap) 01/12/2030   Hepatitis C Screening  Completed   HPV VACCINES  Aged Out   HIV Screening  Discontinued    Discussed health benefits of physical activity, and encouraged her to engage in regular exercise appropriate for her age and condition.  1. Annual physical exam Checking labs as below.  Up-to-date on most preventative care.  Wellness information provided with AVS. - Lipid panel - COMPLETE METABOLIC PANEL WITH GFR - CBC with Differential/Platelet  2. Mixed hyperlipidemia History of hyperlipidemia.  Checking labs today. - COMPLETE METABOLIC PANEL WITH GFR - CBC with Differential/Platelet  3. Need  for shingles vaccine Discussed recommendations for shingles vaccination.  Deferred for today however she will think about this and let me know if she would like to proceed.  4. Diabetes mellitus screening Checking hemoglobin A1c. - Hemoglobin A1c  5. Thyroid disorder screen Checking TSH. - TSH  6. Essential thrombocytosis (HCC) Checking CBC.  7. Caregiver stress Unfortunately, her husband has been diagnosed with rectal cancer as well as aphasia.  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  Under considerable stress has a lot of worry/anxiety regarding his health.  Discussed options for management.  Referring to behavioral health for counseling.  I would like to get her in with [facility].  Reviewed medication options.  Starting Lexapro 5 mg daily. - Ambulatory referral to Behavioral Health  8. Well woman exam Referring to OB/GYN per patient preference. - Ambulatory referral to Obstetrics / Gynecology  9. Pelvic pain 10. Chronic pain of both knees Continues to have issues with pelvic and knee pain.  Previously doing aquatic physical therapy which was very beneficial but has not been able to do this with her husband's recent illnesses.  Refilling meloxicam 15 mg daily. - meloxicam (MOBIC) 15 MG tablet; ONE TABLET BY MOUTH EVERY 24 HOURS WITH A MEAL FOR 2 WEEKS, THEN ONCE EVERY 24 HOURS AS NEEDED PAIN.  Dispense: 30 tablet; Refill: 3   Return in about 4 weeks (around 03/31/2023) for mood follow up.   Christen Butter, NP

## 2023-03-04 LAB — COMPLETE METABOLIC PANEL WITH GFR
AG Ratio: 1.3 (calc) (ref 1.0–2.5)
ALT: 12 U/L (ref 6–29)
AST: 16 U/L (ref 10–35)
Albumin: 4.1 g/dL (ref 3.6–5.1)
Alkaline phosphatase (APISO): 85 U/L (ref 37–153)
BUN: 13 mg/dL (ref 7–25)
CO2: 31 mmol/L (ref 20–32)
Calcium: 9.9 mg/dL (ref 8.6–10.4)
Chloride: 103 mmol/L (ref 98–110)
Creat: 0.73 mg/dL (ref 0.50–1.05)
Globulin: 3.2 g/dL (calc) (ref 1.9–3.7)
Glucose, Bld: 97 mg/dL (ref 65–99)
Potassium: 4.7 mmol/L (ref 3.5–5.3)
Sodium: 142 mmol/L (ref 135–146)
Total Bilirubin: 0.5 mg/dL (ref 0.2–1.2)
Total Protein: 7.3 g/dL (ref 6.1–8.1)
eGFR: 94 mL/min/{1.73_m2} (ref >=60)

## 2023-03-04 LAB — HEMOGLOBIN A1C
Hgb A1c MFr Bld: 6.1 % of total Hgb — ABNORMAL HIGH (ref <5.7)
Mean Plasma Glucose: 128 mg/dL
eAG (mmol/L): 7.1 mmol/L

## 2023-03-04 LAB — CBC WITH DIFFERENTIAL/PLATELET
Absolute Monocytes: 721 cells/uL (ref 200–950)
Basophils Absolute: 53 cells/uL (ref 0–200)
Basophils Relative: 0.6 %
Eosinophils Absolute: 107 cells/uL (ref 15–500)
Eosinophils Relative: 1.2 %
HCT: 42.3 % (ref 35.0–45.0)
Hemoglobin: 13.5 g/dL (ref 11.7–15.5)
Lymphs Abs: 4210 cells/uL — ABNORMAL HIGH (ref 850–3900)
MCH: 28.6 pg (ref 27.0–33.0)
MCHC: 31.9 g/dL — ABNORMAL LOW (ref 32.0–36.0)
MCV: 89.6 fL (ref 80.0–100.0)
MPV: 9.9 fL (ref 7.5–12.5)
Monocytes Relative: 8.1 %
Neutro Abs: 3809 cells/uL (ref 1500–7800)
Neutrophils Relative %: 42.8 %
Platelets: 541 10*3/uL — ABNORMAL HIGH (ref 140–400)
RBC: 4.72 10*6/uL (ref 3.80–5.10)
RDW: 12.7 % (ref 11.0–15.0)
Total Lymphocyte: 47.3 %
WBC: 8.9 10*3/uL (ref 3.8–10.8)

## 2023-03-04 LAB — LIPID PANEL
Cholesterol: 206 mg/dL — ABNORMAL HIGH (ref <200)
HDL: 60 mg/dL (ref >=50)
LDL Cholesterol (Calc): 124 mg/dL (calc) — ABNORMAL HIGH
Non-HDL Cholesterol (Calc): 146 mg/dL (calc) — ABNORMAL HIGH (ref <130)
Total CHOL/HDL Ratio: 3.4 (calc) (ref <5.0)
Triglycerides: 113 mg/dL (ref <150)

## 2023-03-04 LAB — TSH: TSH: 0.47 mIU/L (ref 0.40–4.50)

## 2023-03-27 DIAGNOSIS — M25551 Pain in right hip: Secondary | ICD-10-CM | POA: Diagnosis not present

## 2023-03-27 DIAGNOSIS — M5441 Lumbago with sciatica, right side: Secondary | ICD-10-CM | POA: Diagnosis not present

## 2023-03-27 DIAGNOSIS — M9905 Segmental and somatic dysfunction of pelvic region: Secondary | ICD-10-CM | POA: Diagnosis not present

## 2023-03-27 DIAGNOSIS — M9903 Segmental and somatic dysfunction of lumbar region: Secondary | ICD-10-CM | POA: Diagnosis not present

## 2023-03-28 ENCOUNTER — Ambulatory Visit
Admission: EM | Admit: 2023-03-28 | Discharge: 2023-03-28 | Disposition: A | Discharge status: Home / Self Care | Payer: Federal, State, Local not specified - PPO | Attending: Emergency Medicine | Admitting: Emergency Medicine

## 2023-03-28 DIAGNOSIS — N3001 Acute cystitis with hematuria: Secondary | ICD-10-CM

## 2023-03-28 DIAGNOSIS — R102 Pelvic and perineal pain: Secondary | ICD-10-CM | POA: Insufficient documentation

## 2023-03-28 DIAGNOSIS — M5441 Lumbago with sciatica, right side: Secondary | ICD-10-CM | POA: Diagnosis not present

## 2023-03-28 DIAGNOSIS — Z87440 Personal history of urinary (tract) infections: Secondary | ICD-10-CM | POA: Diagnosis not present

## 2023-03-28 DIAGNOSIS — M25551 Pain in right hip: Secondary | ICD-10-CM | POA: Diagnosis not present

## 2023-03-28 DIAGNOSIS — M9903 Segmental and somatic dysfunction of lumbar region: Secondary | ICD-10-CM | POA: Diagnosis not present

## 2023-03-28 DIAGNOSIS — M9905 Segmental and somatic dysfunction of pelvic region: Secondary | ICD-10-CM | POA: Diagnosis not present

## 2023-03-28 LAB — POCT URINALYSIS DIP (MANUAL ENTRY)
Bilirubin, UA: NEGATIVE
Glucose, UA: NEGATIVE mg/dL
Ketones, POC UA: NEGATIVE mg/dL
Nitrite, UA: NEGATIVE
Protein Ur, POC: NEGATIVE mg/dL
Spec Grav, UA: >=1.03 — AB (ref 1.010–1.025)
Urobilinogen, UA: 0.2 E.U./dL
pH, UA: 5.5 (ref 5.0–8.0)

## 2023-03-28 MED ORDER — ONDANSETRON 4 MG PO TBDP
4.0000 mg | ORAL_TABLET | Freq: Once | ORAL | Status: AC
  Administered 2023-03-28: 4 mg via ORAL

## 2023-03-28 MED ORDER — CEFTRIAXONE SODIUM 1 G IJ SOLR
1000.0000 mg | Freq: Once | INTRAMUSCULAR | Status: AC
  Administered 2023-03-28: 1000 mg via INTRAMUSCULAR

## 2023-03-28 MED ORDER — SULFAMETHOXAZOLE-TRIMETHOPRIM 800-160 MG PO TABS: 1.0000 | ORAL_TABLET | Freq: Two times a day (BID) | ORAL | 0 refills | Status: AC

## 2023-03-28 NOTE — ED Provider Notes (Signed)
[facility]    CSN: [id number] Arrival date & time: 03/28/23  1913    HISTORY   Chief Complaint  Patient presents with   Pelvic Pain   Dysuria   HPI Carol Franklin is a pleasant, 62 y.o. female who presents to urgent care today. Patient complains of acute onset of burning with urination and pelvic pressure/pain this afternoon.  Patient rates her pain 10 out of 10 at this time and states it feels like a pulling sensation across her lower abdomen.  Patient states she has not had anything like this in the past but has had a UTI in the past.  Patient began vomiting in the exam room secondary to her pain during her visit today.  Patient states she has not tried anything to alleviate her symptoms.  Patient denies fever, body aches, chills, nausea, vomiting, diarrhea, confusion.   The history is provided by the patient.   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   Fatigue 10/21/2020   Chronic right-sided low back pain with right-sided sciatica 10/21/2020   Loud snoring 10/21/2020   Eczema 02/14/2020   Weight gain 02/14/2020   Chronic pain of both knees 01/13/2020   Osteopenia 12/04/2015   Menopausal hot flushes 09/17/2014   Essential thrombocytosis (HCC) 01/13/2012   Hyperlipidemia 01/13/2012   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/2012   BMI 35.0-35.9,adult 01/06/2012   Past Surgical History:  Procedure Laterality Date   CESAREAN SECTION     X2   DILATION AND CURETTAGE OF UTERUS     KNEE SURGERY     left   OB History     Gravida  4   Para  2   Term  2   Preterm      AB  2   Living  2      SAB  2   IAB      Ectopic      Multiple      Live Births  2           Home Medications    Prior to Admission medications   Medication Sig Start Date End Date Taking? Authorizing Provider  escitalopram (LEXAPRO) 5 MG tablet Take 1 tablet (5 mg total) by mouth daily. 03/03/23   Christen Butter, NP  meloxicam (MOBIC) 15 MG tablet ONE TABLET BY MOUTH EVERY 24 HOURS WITH A MEAL FOR 2 WEEKS, THEN ONCE EVERY 24 HOURS AS NEEDED PAIN. 03/03/23   Christen Butter, NP  triamcinolone cream (KENALOG) 0.1 % Apply 1 application topically 2 (two) times daily. 03/12/21   Christen Butter, NP    Family History Family History  Problem Relation Age of Onset   Hypertension Father    Heart disease Father    Breast cancer Maternal Aunt    Thyroid disease Mother    Diabetes Maternal Grandmother    Cancer Paternal Grandmother        colon   Social History Social History   Tobacco Use   Smoking status: Never   Smokeless tobacco: Never  Vaping Use   Vaping Use: Never used  Substance Use Topics   Alcohol use: No    Alcohol/week: 0.0 standard drinks of alcohol  Comment: rare   Drug use: No   Allergies   Naproxen  Review of Systems Review of Systems Pertinent findings revealed after performing a 14 point review of systems has been noted in the history of present illness.  Physical Exam Vital Signs BP (!) 161/102   Pulse 84   Temp (!) 97.5 F (36.4 C)   Resp 20   LMP 11/17/2015   SpO2 98%   No data found.  Physical Exam Vitals and nursing note reviewed.  Constitutional:      General: She is not in acute distress.    Appearance: Normal appearance. She is not ill-appearing.  HENT:     Head: Normocephalic and atraumatic.  Eyes:     General: Lids are normal.        Right eye: No discharge.        Left eye: No discharge.     Extraocular Movements: Extraocular movements intact.     Conjunctiva/sclera: Conjunctivae normal.     Right eye: Right conjunctiva is not injected.     Left eye: Left conjunctiva is not injected.  Neck:     Trachea: Trachea and phonation  normal.  Cardiovascular:     Rate and Rhythm: Normal rate and regular rhythm.     Pulses: Normal pulses.     Heart sounds: Normal heart sounds. No murmur heard.    No friction rub. No gallop.  Pulmonary:     Effort: Pulmonary effort is normal. No accessory muscle usage, prolonged expiration or respiratory distress.     Breath sounds: Normal breath sounds. No stridor, decreased air movement or transmitted upper airway sounds. No decreased breath sounds, wheezing, rhonchi or rales.  Chest:     Chest wall: No tenderness.  Abdominal:     General: Abdomen is flat. Bowel sounds are normal. There is no distension.     Palpations: Abdomen is soft.     Tenderness: There is abdominal tenderness in the suprapubic area. There is no right CVA tenderness or left CVA tenderness.     Hernia: No hernia is present.  Musculoskeletal:        General: Normal range of motion.     Cervical back: Normal range of motion and neck supple. Normal range of motion.  Lymphadenopathy:     Cervical: No cervical adenopathy.  Skin:    General: Skin is warm and dry.     Findings: No erythema or rash.  Neurological:     General: No focal deficit present.     Mental Status: She is alert and oriented to person, place, and time.  Psychiatric:        Mood and Affect: Mood normal.        Behavior: Behavior normal.     Visual Acuity Right Eye Distance:   Left Eye Distance:   Bilateral Distance:    Right Eye Near:   Left Eye Near:    Bilateral Near:     [facility] Couse / Diagnostics / Procedures:     Radiology No results found.  Procedures Procedures (including critical care time) EKG  Pending results:  Labs Reviewed  POCT URINALYSIS DIP (MANUAL ENTRY) - Abnormal; Notable for the following components:      Result Value   Clarity, UA cloudy (*)    Spec Grav, UA >=1.030 (*)    Blood, UA large (*)    Leukocytes, UA Trace (*)    All other components within normal limits  URINE CULTURE    Medications Ordered  in UC: Medications  cefTRIAXone (ROCEPHIN) injection 1,000 mg (has no administration in time range)  ondansetron (ZOFRAN-ODT) disintegrating tablet 4 mg (4 mg Oral Given 03/28/23 1939)    UC Diagnoses / Final Clinical Impressions(s)   I have reviewed the triage vital signs and the nursing notes.  Pertinent labs & imaging results that were available during my care of the patient were reviewed by me and considered in my medical decision making (see chart for details).    Final diagnoses:  Acute cystitis with hematuria   {LMSTDP:27058}  {LMUTIP:27060}  Please see discharge instructions below for details of plan of care as provided to patient. ED Prescriptions     Medication Sig Dispense Auth. Provider   sulfamethoxazole-trimethoprim (BACTRIM DS) 800-160 MG tablet Take 1 tablet by mouth 2 (two) times daily for 7 days. 14 tablet Theadora Rama Scales, PA-C      PDMP not reviewed this encounter.  Disposition Upon Discharge:  Condition: stable for discharge home  Patient presented with concern for an acute illness with associated systemic symptoms and significant discomfort requiring urgent management. In my opinion, this is a condition that a prudent lay person (someone who possesses an average knowledge of health and medicine) may potentially expect to result in complications if not addressed urgently such as respiratory distress, impairment of bodily function or dysfunction of bodily organs.   As such, the patient has been evaluated and assessed, work-up was performed and treatment was provided in alignment with urgent care protocols and evidence based medicine.  Patient/parent/caregiver has been advised that the patient may require follow up for further testing and/or treatment if the symptoms continue in spite of treatment, as clinically indicated and appropriate.  Routine symptom specific, illness specific and/or disease specific instructions were discussed with the patient and/or  caregiver at length.  Prevention strategies for avoiding STD exposure were also discussed.  The patient will follow up with their current PCP if and as advised. If the patient does not currently have a PCP we will assist them in obtaining one.   The patient may need specialty follow up if the symptoms continue, in spite of conservative treatment and management, for further workup, evaluation, consultation and treatment as clinically indicated and appropriate.  Patient/parent/caregiver verbalized understanding and agreement of plan as discussed.  All questions were addressed during visit.  Please see discharge instructions below for further details of plan.  Discharge Instructions:   Discharge Instructions      The urinalysis that we performed in the clinic today was abnormal.  Urine culture will be performed per our protocol.  The result of the urine culture will be available in the next 3 to 5 days and will be posted to your MyChart account.  If there is an abnormal finding, you will be contacted by phone and advised of further treatment recommendations, if any.   You were advised to begin antibiotics today because your urinalysis is abnormal and you are having active symptoms of an acute lower urinary tract infection also known as cystitis.  It is very important that you take all doses exactly as prescribed.  Incomplete antibiotic therapy can cause worsening urinary tract infection that can become aggressive, escape from urinary tract into your bloodstream causing sepsis which will require hospitalization.   You received an injection of a strong antibiotic called ceftriaxone during your today to help expedite resolution of your urinary tract infection.  You should begin to feel better after about an hour.  After 1 hour, if  you have not noticed any meaningful improvement of your pelvic pain, I recommend that she go to the emergency room for further evaluation.   Please pick up and begin taking  your prescription for Bactrim DS (trimethoprim sulfamethoxazole) as soon as possible.  Please take all doses exactly as prescribed.  You can take this medication with or without food.  This medication is safe to take with your other medications.    Please consider abstaining from sexual intercourse while you are being treated for urinary tract infection.   If you have not had complete resolution of your symptoms after completing treatment as prescribed, please return to urgent care for repeat evaluation or follow-up with your primary care provider.  Repeat urinalysis and urine culture may be indicated for more directed therapy.   Thank you for visiting urgent care today.  I appreciate the opportunity to participate in your care.       This office note has been dictated using Teaching laboratory technician.  Unfortunately, this method of dictation can sometimes lead to typographical or grammatical errors.  I apologize for your inconvenience in advance if this occurs.  Please do not hesitate to reach out to me if clarification is needed.

## 2023-03-28 NOTE — ED Triage Notes (Addendum)
Pt presents to [facility] with co of dysuria and pelvic pain  that is a "pulling sensation"  and is a 10/10 since this afternoon when she used the restroom before leaving work. Pt reports her urine was dark. No hematuria, no vaginal bleeding. Last obgyn app 3 yrs ago.

## 2023-03-28 NOTE — Discharge Instructions (Addendum)
The urinalysis that we performed in the clinic today was abnormal.  Urine culture will be performed per our protocol.  The result of the urine culture will be available in the next 3 to 5 days and will be posted to your MyChart account.  If there is an abnormal finding, you will be contacted by phone and advised of further treatment recommendations, if any.   You were advised to begin antibiotics today because your urinalysis is abnormal and you are having active symptoms of an acute lower urinary tract infection also known as cystitis.  It is very important that you take all doses exactly as prescribed.  Incomplete antibiotic therapy can cause worsening urinary tract infection that can become aggressive, escape from urinary tract into your bloodstream causing sepsis which will require hospitalization.   You received an injection of a strong antibiotic called ceftriaxone during your today to help expedite resolution of your urinary tract infection.  You should begin to feel better after about an hour.  After 1 hour, if you have not noticed any meaningful improvement of your pelvic pain, I recommend that she go to the emergency room for further evaluation.   Please pick up and begin taking your prescription for Bactrim DS (trimethoprim sulfamethoxazole) as soon as possible.  Please take all doses exactly as prescribed.  You can take this medication with or without food.  This medication is safe to take with your other medications.    Please consider abstaining from sexual intercourse while you are being treated for urinary tract infection.   If you have not had complete resolution of your symptoms after completing treatment as prescribed, please return to urgent care for repeat evaluation or follow-up with your primary care provider.  Repeat urinalysis and urine culture may be indicated for more directed therapy.   Thank you for visiting urgent care today.  I appreciate the opportunity to participate in  your care.

## 2023-03-30 ENCOUNTER — Encounter: Payer: Self-pay | Admitting: Medical-Surgical

## 2023-03-30 LAB — URINE CULTURE

## 2023-04-03 DIAGNOSIS — M9903 Segmental and somatic dysfunction of lumbar region: Secondary | ICD-10-CM | POA: Diagnosis not present

## 2023-04-03 DIAGNOSIS — M25551 Pain in right hip: Secondary | ICD-10-CM | POA: Diagnosis not present

## 2023-04-03 DIAGNOSIS — M9905 Segmental and somatic dysfunction of pelvic region: Secondary | ICD-10-CM | POA: Diagnosis not present

## 2023-04-03 DIAGNOSIS — M5441 Lumbago with sciatica, right side: Secondary | ICD-10-CM | POA: Diagnosis not present

## 2023-04-05 DIAGNOSIS — M9905 Segmental and somatic dysfunction of pelvic region: Secondary | ICD-10-CM | POA: Diagnosis not present

## 2023-04-05 DIAGNOSIS — M9903 Segmental and somatic dysfunction of lumbar region: Secondary | ICD-10-CM | POA: Diagnosis not present

## 2023-04-05 DIAGNOSIS — M25551 Pain in right hip: Secondary | ICD-10-CM | POA: Diagnosis not present

## 2023-04-05 DIAGNOSIS — M5441 Lumbago with sciatica, right side: Secondary | ICD-10-CM | POA: Diagnosis not present

## 2023-04-07 DIAGNOSIS — M9903 Segmental and somatic dysfunction of lumbar region: Secondary | ICD-10-CM | POA: Diagnosis not present

## 2023-04-07 DIAGNOSIS — M25551 Pain in right hip: Secondary | ICD-10-CM | POA: Diagnosis not present

## 2023-04-07 DIAGNOSIS — M5441 Lumbago with sciatica, right side: Secondary | ICD-10-CM | POA: Diagnosis not present

## 2023-04-07 DIAGNOSIS — M9905 Segmental and somatic dysfunction of pelvic region: Secondary | ICD-10-CM | POA: Diagnosis not present

## 2023-04-14 ENCOUNTER — Ambulatory Visit: Payer: Federal, State, Local not specified - PPO | Admitting: Medical-Surgical

## 2023-04-18 DIAGNOSIS — M25551 Pain in right hip: Secondary | ICD-10-CM | POA: Diagnosis not present

## 2023-04-18 DIAGNOSIS — M5441 Lumbago with sciatica, right side: Secondary | ICD-10-CM | POA: Diagnosis not present

## 2023-04-18 DIAGNOSIS — M9905 Segmental and somatic dysfunction of pelvic region: Secondary | ICD-10-CM | POA: Diagnosis not present

## 2023-04-18 DIAGNOSIS — M9903 Segmental and somatic dysfunction of lumbar region: Secondary | ICD-10-CM | POA: Diagnosis not present

## 2023-04-22 DIAGNOSIS — M5441 Lumbago with sciatica, right side: Secondary | ICD-10-CM | POA: Diagnosis not present

## 2023-04-22 DIAGNOSIS — M9903 Segmental and somatic dysfunction of lumbar region: Secondary | ICD-10-CM | POA: Diagnosis not present

## 2023-04-22 DIAGNOSIS — M9905 Segmental and somatic dysfunction of pelvic region: Secondary | ICD-10-CM | POA: Diagnosis not present

## 2023-04-22 DIAGNOSIS — M25551 Pain in right hip: Secondary | ICD-10-CM | POA: Diagnosis not present

## 2023-04-26 DIAGNOSIS — M5441 Lumbago with sciatica, right side: Secondary | ICD-10-CM | POA: Diagnosis not present

## 2023-04-26 DIAGNOSIS — M9905 Segmental and somatic dysfunction of pelvic region: Secondary | ICD-10-CM | POA: Diagnosis not present

## 2023-04-26 DIAGNOSIS — M25551 Pain in right hip: Secondary | ICD-10-CM | POA: Diagnosis not present

## 2023-04-26 DIAGNOSIS — M9903 Segmental and somatic dysfunction of lumbar region: Secondary | ICD-10-CM | POA: Diagnosis not present

## 2023-04-29 DIAGNOSIS — M25551 Pain in right hip: Secondary | ICD-10-CM | POA: Diagnosis not present

## 2023-04-29 DIAGNOSIS — M9903 Segmental and somatic dysfunction of lumbar region: Secondary | ICD-10-CM | POA: Diagnosis not present

## 2023-04-29 DIAGNOSIS — M5441 Lumbago with sciatica, right side: Secondary | ICD-10-CM | POA: Diagnosis not present

## 2023-04-29 DIAGNOSIS — M9905 Segmental and somatic dysfunction of pelvic region: Secondary | ICD-10-CM | POA: Diagnosis not present

## 2023-05-06 DIAGNOSIS — M5441 Lumbago with sciatica, right side: Secondary | ICD-10-CM | POA: Diagnosis not present

## 2023-05-06 DIAGNOSIS — M9905 Segmental and somatic dysfunction of pelvic region: Secondary | ICD-10-CM | POA: Diagnosis not present

## 2023-05-06 DIAGNOSIS — M9903 Segmental and somatic dysfunction of lumbar region: Secondary | ICD-10-CM | POA: Diagnosis not present

## 2023-05-06 DIAGNOSIS — M25551 Pain in right hip: Secondary | ICD-10-CM | POA: Diagnosis not present

## 2023-07-17 DIAGNOSIS — M9905 Segmental and somatic dysfunction of pelvic region: Secondary | ICD-10-CM | POA: Diagnosis not present

## 2023-07-17 DIAGNOSIS — M9903 Segmental and somatic dysfunction of lumbar region: Secondary | ICD-10-CM | POA: Diagnosis not present

## 2023-07-17 DIAGNOSIS — M5441 Lumbago with sciatica, right side: Secondary | ICD-10-CM | POA: Diagnosis not present

## 2023-07-17 DIAGNOSIS — M25551 Pain in right hip: Secondary | ICD-10-CM | POA: Diagnosis not present

## 2023-07-19 DIAGNOSIS — M25551 Pain in right hip: Secondary | ICD-10-CM | POA: Diagnosis not present

## 2023-07-19 DIAGNOSIS — M9905 Segmental and somatic dysfunction of pelvic region: Secondary | ICD-10-CM | POA: Diagnosis not present

## 2023-07-19 DIAGNOSIS — M5441 Lumbago with sciatica, right side: Secondary | ICD-10-CM | POA: Diagnosis not present

## 2023-07-19 DIAGNOSIS — M9903 Segmental and somatic dysfunction of lumbar region: Secondary | ICD-10-CM | POA: Diagnosis not present

## 2023-07-31 DIAGNOSIS — M9903 Segmental and somatic dysfunction of lumbar region: Secondary | ICD-10-CM | POA: Diagnosis not present

## 2023-07-31 DIAGNOSIS — M9905 Segmental and somatic dysfunction of pelvic region: Secondary | ICD-10-CM | POA: Diagnosis not present

## 2023-07-31 DIAGNOSIS — M25551 Pain in right hip: Secondary | ICD-10-CM | POA: Diagnosis not present

## 2023-07-31 DIAGNOSIS — M5441 Lumbago with sciatica, right side: Secondary | ICD-10-CM | POA: Diagnosis not present

## 2023-08-08 DIAGNOSIS — M5441 Lumbago with sciatica, right side: Secondary | ICD-10-CM | POA: Diagnosis not present

## 2023-08-08 DIAGNOSIS — M9903 Segmental and somatic dysfunction of lumbar region: Secondary | ICD-10-CM | POA: Diagnosis not present

## 2023-08-08 DIAGNOSIS — M25551 Pain in right hip: Secondary | ICD-10-CM | POA: Diagnosis not present

## 2023-08-08 DIAGNOSIS — M9905 Segmental and somatic dysfunction of pelvic region: Secondary | ICD-10-CM | POA: Diagnosis not present

## 2023-08-11 DIAGNOSIS — M5441 Lumbago with sciatica, right side: Secondary | ICD-10-CM | POA: Diagnosis not present

## 2023-08-11 DIAGNOSIS — M9905 Segmental and somatic dysfunction of pelvic region: Secondary | ICD-10-CM | POA: Diagnosis not present

## 2023-08-11 DIAGNOSIS — M25551 Pain in right hip: Secondary | ICD-10-CM | POA: Diagnosis not present

## 2023-08-11 DIAGNOSIS — M9903 Segmental and somatic dysfunction of lumbar region: Secondary | ICD-10-CM | POA: Diagnosis not present

## 2023-09-05 ENCOUNTER — Telehealth: Payer: Self-pay

## 2023-09-05 DIAGNOSIS — I4510 Unspecified right bundle-branch block: Secondary | ICD-10-CM | POA: Diagnosis not present

## 2023-09-05 DIAGNOSIS — R079 Chest pain, unspecified: Secondary | ICD-10-CM | POA: Diagnosis not present

## 2023-09-05 DIAGNOSIS — M25511 Pain in right shoulder: Secondary | ICD-10-CM | POA: Diagnosis not present

## 2023-09-05 DIAGNOSIS — M7531 Calcific tendinitis of right shoulder: Secondary | ICD-10-CM | POA: Diagnosis not present

## 2023-09-05 DIAGNOSIS — R9431 Abnormal electrocardiogram [ECG] [EKG]: Secondary | ICD-10-CM | POA: Diagnosis not present

## 2023-09-05 DIAGNOSIS — M778 Other enthesopathies, not elsewhere classified: Secondary | ICD-10-CM | POA: Diagnosis not present

## 2023-09-05 DIAGNOSIS — I498 Other specified cardiac arrhythmias: Secondary | ICD-10-CM | POA: Diagnosis not present

## 2023-09-05 DIAGNOSIS — M19011 Primary osteoarthritis, right shoulder: Secondary | ICD-10-CM | POA: Diagnosis not present

## 2023-09-05 DIAGNOSIS — R002 Palpitations: Secondary | ICD-10-CM | POA: Diagnosis not present

## 2023-09-05 DIAGNOSIS — F439 Reaction to severe stress, unspecified: Secondary | ICD-10-CM | POA: Diagnosis not present

## 2023-09-05 DIAGNOSIS — R0789 Other chest pain: Secondary | ICD-10-CM | POA: Diagnosis not present

## 2023-09-05 DIAGNOSIS — N390 Urinary tract infection, site not specified: Secondary | ICD-10-CM | POA: Diagnosis not present

## 2023-09-05 DIAGNOSIS — R918 Other nonspecific abnormal finding of lung field: Secondary | ICD-10-CM | POA: Diagnosis not present

## 2023-09-05 DIAGNOSIS — Z87440 Personal history of urinary (tract) infections: Secondary | ICD-10-CM | POA: Diagnosis not present

## 2023-09-05 NOTE — Telephone Encounter (Signed)
Konnor called and reported having off and on chest pain and arm pain. I advised her to go to the ED ASAP to be evaluated.

## 2023-09-11 DIAGNOSIS — K7689 Other specified diseases of liver: Secondary | ICD-10-CM | POA: Diagnosis not present

## 2023-09-11 DIAGNOSIS — R1030 Lower abdominal pain, unspecified: Secondary | ICD-10-CM | POA: Diagnosis not present

## 2023-09-11 DIAGNOSIS — K5730 Diverticulosis of large intestine without perforation or abscess without bleeding: Secondary | ICD-10-CM | POA: Diagnosis not present

## 2023-09-11 DIAGNOSIS — R109 Unspecified abdominal pain: Secondary | ICD-10-CM | POA: Diagnosis not present

## 2023-09-11 DIAGNOSIS — N200 Calculus of kidney: Secondary | ICD-10-CM | POA: Diagnosis not present

## 2023-09-11 DIAGNOSIS — K429 Umbilical hernia without obstruction or gangrene: Secondary | ICD-10-CM | POA: Diagnosis not present

## 2023-09-11 DIAGNOSIS — K760 Fatty (change of) liver, not elsewhere classified: Secondary | ICD-10-CM | POA: Diagnosis not present

## 2023-09-11 DIAGNOSIS — Z888 Allergy status to other drugs, medicaments and biological substances status: Secondary | ICD-10-CM | POA: Diagnosis not present

## 2023-09-15 ENCOUNTER — Ambulatory Visit: Payer: Federal, State, Local not specified - PPO | Admitting: Medical-Surgical

## 2023-09-27 DIAGNOSIS — F329 Major depressive disorder, single episode, unspecified: Secondary | ICD-10-CM | POA: Diagnosis not present

## 2023-10-06 DIAGNOSIS — F329 Major depressive disorder, single episode, unspecified: Secondary | ICD-10-CM | POA: Diagnosis not present

## 2023-10-20 ENCOUNTER — Ambulatory Visit: Payer: Federal, State, Local not specified - PPO | Admitting: Medical-Surgical

## 2023-10-20 ENCOUNTER — Encounter: Payer: Self-pay | Admitting: Medical-Surgical

## 2023-10-20 VITALS — BP 119/78 | HR 82 | Resp 20 | Ht 61.0 in | Wt 199.0 lb

## 2023-10-20 DIAGNOSIS — Z636 Dependent relative needing care at home: Secondary | ICD-10-CM | POA: Diagnosis not present

## 2023-10-20 DIAGNOSIS — R1084 Generalized abdominal pain: Secondary | ICD-10-CM

## 2023-10-20 DIAGNOSIS — R8290 Unspecified abnormal findings in urine: Secondary | ICD-10-CM | POA: Diagnosis not present

## 2023-10-20 DIAGNOSIS — R9431 Abnormal electrocardiogram [ECG] [EKG]: Secondary | ICD-10-CM

## 2023-10-20 DIAGNOSIS — M2550 Pain in unspecified joint: Secondary | ICD-10-CM

## 2023-10-20 LAB — POCT URINALYSIS DIP (CLINITEK)
Bilirubin, UA: NEGATIVE
Blood, UA: NEGATIVE
Glucose, UA: NEGATIVE mg/dL
Ketones, POC UA: NEGATIVE mg/dL
Leukocytes, UA: NEGATIVE
Nitrite, UA: NEGATIVE
POC PROTEIN,UA: NEGATIVE
Spec Grav, UA: 1.025 (ref 1.010–1.025)
Urobilinogen, UA: 0.2 U/dL
pH, UA: 6 (ref 5.0–8.0)

## 2023-10-20 MED ORDER — MELOXICAM 15 MG PO TABS: 15.0000 mg | ORAL_TABLET | Freq: Every day | ORAL | 0 refills | Status: DC

## 2023-10-20 MED ORDER — DULOXETINE HCL 30 MG PO CPEP: 30.0000 mg | ORAL_CAPSULE | Freq: Every day | ORAL | 1 refills | Status: DC

## 2023-10-20 NOTE — Progress Notes (Signed)
        Established patient visit  History, exam, impression, and plan:  1. Polyarthralgia (Primary) Pleasant 62 year old female presenting today for evaluation of polyarthralgia.  She notes that she has previously had shoulder pain however that seems to have improved.  She is now hurting in her back, hips, knees, and "all over".  Has an orthopedist that takes care of her knees but has not been back for a while and is overdue to follow-up.  She is only taking ibuprofen and extra strength Tylenol on a as needed basis but that does not seem to be helping much.  She had some lab work done at her last 2 ED visits only showing elevation of her white blood cell count about 6 weeks ago.  Discussed drawing additional lab work today to evaluate for inflammatory markers however she declined for now.  After review of options to help with widespread body aches and pains, plan to start Cymbalta 30 mg daily.  Recommend following up with orthopedic regarding specific treatment of her knees as this seems to be a long-term chronic issue.  2. Generalized abdominal pain She has had some issues with abdominal pain although reports that the location of the pain changes as does the quality of it.  On her last ED visit, she underwent extensive imaging which showed a 14 mm cyst on her liver near the gallbladder fossa.  She also has a nonobstructing kidney stone and fatty liver disease that was discovered.  No concerns with constipation, diarrhea, melena, hematochezia, or hematemesis.  On exam, she is diffusely tender but nondistended, soft.  Bowel sounds normal.  Unclear etiology at this point.  Needs further evaluation.  Referring to GI. - Ambulatory referral to Gastroenterology  3. 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  Her health is beginning to fail as she is not caring for herself like she should.  Discussed the need for self-care in order to be able to  provide for her husband.  She agrees that this is important.  Starting Cymbalta 30 mg daily as noted above which may help with her overall stress levels.  Consider that some of her above symptoms may be psychosomatic in which the Cymbalta will be doubly helpful.  Plan to follow-up in 4 weeks to evaluate tolerance and response to the medication.  She is doing counseling so recommend continuing this as scheduled.  4. Abnormal EKG Had an abnormal EKG while she was in the ED that showed normal sinus rhythm with an incomplete right bundle branch block.  She is at higher risk for cardiovascular issues given class II obesity, sedentary behavior, and genetics.  Offered to repeat EKG in the office today but this was declined.  She would like to discuss her current health and risks related to cardiac health with her cardiologist.  Referral placed. - Ambulatory referral to Cardiology  5. Abnormal urine odor Has had some abnormal urinary odor and wanted to make sure that she did not have a UTI given the lower abdominal pains that she has been experiencing.  POCT urinalysis normal.  We will go ahead and send for culture to be safe. - Urine Culture - POCT URINALYSIS DIP (CLINITEK)  Procedures performed this visit: None.  Return in about 4 weeks (around 11/17/2023) for mood/pain follow up.  __________________________________ Thayer Ohm, DNP, APRN, FNP-BC Primary Care and Sports Medicine [facility] [place]

## 2023-10-22 LAB — URINE CULTURE

## 2023-10-23 DIAGNOSIS — F329 Major depressive disorder, single episode, unspecified: Secondary | ICD-10-CM | POA: Diagnosis not present

## 2023-11-03 ENCOUNTER — Other Ambulatory Visit: Payer: Self-pay | Admitting: Medical-Surgical

## 2023-11-03 DIAGNOSIS — Z1231 Encounter for screening mammogram for malignant neoplasm of breast: Secondary | ICD-10-CM

## 2023-11-10 DIAGNOSIS — K760 Fatty (change of) liver, not elsewhere classified: Secondary | ICD-10-CM | POA: Diagnosis not present

## 2023-11-10 DIAGNOSIS — R1084 Generalized abdominal pain: Secondary | ICD-10-CM | POA: Diagnosis not present

## 2023-11-13 DIAGNOSIS — M9903 Segmental and somatic dysfunction of lumbar region: Secondary | ICD-10-CM | POA: Diagnosis not present

## 2023-11-13 DIAGNOSIS — M9905 Segmental and somatic dysfunction of pelvic region: Secondary | ICD-10-CM | POA: Diagnosis not present

## 2023-11-13 DIAGNOSIS — M25551 Pain in right hip: Secondary | ICD-10-CM | POA: Diagnosis not present

## 2023-11-13 DIAGNOSIS — M5441 Lumbago with sciatica, right side: Secondary | ICD-10-CM | POA: Diagnosis not present

## 2023-11-15 DIAGNOSIS — M25551 Pain in right hip: Secondary | ICD-10-CM | POA: Diagnosis not present

## 2023-11-15 DIAGNOSIS — M5441 Lumbago with sciatica, right side: Secondary | ICD-10-CM | POA: Diagnosis not present

## 2023-11-15 DIAGNOSIS — M9903 Segmental and somatic dysfunction of lumbar region: Secondary | ICD-10-CM | POA: Diagnosis not present

## 2023-11-15 DIAGNOSIS — M9905 Segmental and somatic dysfunction of pelvic region: Secondary | ICD-10-CM | POA: Diagnosis not present

## 2023-11-17 ENCOUNTER — Ambulatory Visit: Payer: Federal, State, Local not specified - PPO | Admitting: Medical-Surgical

## 2023-11-21 DIAGNOSIS — M5441 Lumbago with sciatica, right side: Secondary | ICD-10-CM | POA: Diagnosis not present

## 2023-11-21 DIAGNOSIS — M9903 Segmental and somatic dysfunction of lumbar region: Secondary | ICD-10-CM | POA: Diagnosis not present

## 2023-11-21 DIAGNOSIS — M9905 Segmental and somatic dysfunction of pelvic region: Secondary | ICD-10-CM | POA: Diagnosis not present

## 2023-11-21 DIAGNOSIS — M25551 Pain in right hip: Secondary | ICD-10-CM | POA: Diagnosis not present

## 2023-11-25 DIAGNOSIS — M25551 Pain in right hip: Secondary | ICD-10-CM | POA: Diagnosis not present

## 2023-11-25 DIAGNOSIS — M5441 Lumbago with sciatica, right side: Secondary | ICD-10-CM | POA: Diagnosis not present

## 2023-11-25 DIAGNOSIS — M9903 Segmental and somatic dysfunction of lumbar region: Secondary | ICD-10-CM | POA: Diagnosis not present

## 2023-11-25 DIAGNOSIS — M9905 Segmental and somatic dysfunction of pelvic region: Secondary | ICD-10-CM | POA: Diagnosis not present

## 2023-11-29 DIAGNOSIS — M9903 Segmental and somatic dysfunction of lumbar region: Secondary | ICD-10-CM | POA: Diagnosis not present

## 2023-11-29 DIAGNOSIS — M25551 Pain in right hip: Secondary | ICD-10-CM | POA: Diagnosis not present

## 2023-11-29 DIAGNOSIS — M5441 Lumbago with sciatica, right side: Secondary | ICD-10-CM | POA: Diagnosis not present

## 2023-11-29 DIAGNOSIS — M9905 Segmental and somatic dysfunction of pelvic region: Secondary | ICD-10-CM | POA: Diagnosis not present

## 2023-12-02 DIAGNOSIS — M5441 Lumbago with sciatica, right side: Secondary | ICD-10-CM | POA: Diagnosis not present

## 2023-12-02 DIAGNOSIS — M9903 Segmental and somatic dysfunction of lumbar region: Secondary | ICD-10-CM | POA: Diagnosis not present

## 2023-12-02 DIAGNOSIS — M9905 Segmental and somatic dysfunction of pelvic region: Secondary | ICD-10-CM | POA: Diagnosis not present

## 2023-12-02 DIAGNOSIS — M25551 Pain in right hip: Secondary | ICD-10-CM | POA: Diagnosis not present

## 2023-12-08 ENCOUNTER — Ambulatory Visit: Payer: BC Managed Care – PPO | Admitting: Medical-Surgical

## 2023-12-08 NOTE — Progress Notes (Deleted)
        Established patient visit  History, exam, impression, and plan:  No problem-specific Assessment & Plan notes found for this encounter.   ROS  Physical Exam  Procedures performed this visit: None.  No follow-ups on file.  __________________________________ Thayer Ohm, DNP, APRN, FNP-BC Primary Care and Sports Medicine [facility] [place]

## 2023-12-11 DIAGNOSIS — M5441 Lumbago with sciatica, right side: Secondary | ICD-10-CM | POA: Diagnosis not present

## 2023-12-11 DIAGNOSIS — M9903 Segmental and somatic dysfunction of lumbar region: Secondary | ICD-10-CM | POA: Diagnosis not present

## 2023-12-11 DIAGNOSIS — M9905 Segmental and somatic dysfunction of pelvic region: Secondary | ICD-10-CM | POA: Diagnosis not present

## 2023-12-11 DIAGNOSIS — M25551 Pain in right hip: Secondary | ICD-10-CM | POA: Diagnosis not present

## 2023-12-12 DIAGNOSIS — R112 Nausea with vomiting, unspecified: Secondary | ICD-10-CM | POA: Insufficient documentation

## 2023-12-12 DIAGNOSIS — R9431 Abnormal electrocardiogram [ECG] [EKG]: Secondary | ICD-10-CM | POA: Insufficient documentation

## 2023-12-12 DIAGNOSIS — I10 Essential (primary) hypertension: Secondary | ICD-10-CM | POA: Insufficient documentation

## 2023-12-12 DIAGNOSIS — L304 Erythema intertrigo: Secondary | ICD-10-CM | POA: Insufficient documentation

## 2023-12-14 ENCOUNTER — Ambulatory Visit: Payer: Federal, State, Local not specified - PPO

## 2023-12-14 VITALS — BP 126/88 | HR 88 | Ht 61.0 in | Wt 198.0 lb

## 2023-12-14 DIAGNOSIS — I4510 Unspecified right bundle-branch block: Secondary | ICD-10-CM

## 2023-12-14 DIAGNOSIS — R9431 Abnormal electrocardiogram [ECG] [EKG]: Secondary | ICD-10-CM | POA: Diagnosis not present

## 2023-12-14 HISTORY — DX: Unspecified right bundle-branch block: I45.10

## 2023-12-14 NOTE — Progress Notes (Signed)
 Cardiology Consultation:    Date:  12/14/2023   ID:  Carol Franklin, DOB 01-May-1961, MRN [id number]  PCP:  Willo Mini, NP  Cardiologist:  Alean JONELLE Kobus, MD   Referring MD: Willo Mini, NP   No chief complaint on file.    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  No active cardiac symptoms at this time.  Problem List Items Addressed This Visit     Abnormal EKG - Primary   Relevant Orders   EKG 12-Lead (Completed)   Incomplete RBBB   Reviewed findings on EKG. Reviewed how this could be a normal variant or a benign finding in people with obesity, sleep apnea, pulmonary issues.  She does not have any significant ongoing cardiac symptoms at this time. This time given no significant concerning symptoms and no significant physical exam findings, no further cardiac workup is indicated.  Advised about healthy dietary and lifestyle habits and monitoring for any changes in her functional capacity, or any other symptoms suggestive of cardiac etiology such as shortness of breath, lightheadedness, palpitations, chest pain, decreased functional capacity or easy fatigability.  She acknowledges and at this time I would hold off on further testing and recommend follow-up with us  on an as-needed basis.          History of Present Illness:    Carol Franklin is a 63 y.o. female who is being seen today for the evaluation of abnormal EKG at recent ER visit suggestive of incomplete RBBB at the request of Willo Mini, NP.   Has history of obesity, hyperlipidemia, mild hepatic steatosis, liver cyst, GERD, prediabetes [last hemoglobin A1c 6.1 in April 2024].  Very pleasant woman here for the visit by herself.   Lives with her husband at home who is currently going through significant health issues from malignancy and sustained a intracranial bleed couple months ago.  She works as a education officer, environmental.  In November with significant stress going on at home, she pulled a muscle on the right side as she was moving heavy stuff and had urinary tract infection.  She was in the ER evaluated for these issues.  EKG done during those visits noted incomplete RBBB morphology with sinus rhythm.  High-sensitivity troponins were unremarkable.  She was not having any cardiac symptoms.  Referred here for further evaluation of incomplete RBBB findings on the EKG.  No significant prior cardiac health issues. Functional status is limited due to lack of exercise with ongoing stressors at home.  Does also report bilateral osteoarthritis of the knee joints that keeps her from walking more.  She has gained weight over the past year and a half.  Denies any chest pain, shortness of breath, orthopnea, paroxysmal nocturnal dyspnea no palpitations, lightheadedness, dizziness or syncopal episodes.  Does snore at night.  No formal sleep study evaluation in the past.  Sleeps usually disturbed as she is stressed and keeping an eye on her husband.  Denies any blood in urine or stools.  No further abdominal symptoms.  Establish care with gastroenterologist and following up with them and anticipates getting an upper GI endoscopy done in near future.  Does not smoke. Rare social alcohol consumption. No illicit drug use. Drinks couple coffees a day.  Reports father with history of heart  disease in his 80s, reportedly had poor dietary and lifestyle habits.  EKG in the clinic today shows sinus rhythm heart rate 88/min, RSR prime pattern in lead V1 V2 with QRS duration 94 ms suggests incomplete RBBB morphology.  No prior EKG available for comparison.  Blood work from 09/11/2023 with normal hemoglobin and hematocrit. Mildly elevated WBC 16.6 and platelets  505. CMP with normal sodium, potassium. BUN 8, creatinine 0.56, EGFR 104, normal renal function parameters.  Normal transaminases and alkaline phosphatase Last hemoglobin A1c 6.1 on March 03, 2023. TSH from April 2024 normal 0.47. Last lipid panel available to review is from April 2024 total cholesterol 206, HDL 60, LDL 124, triglycerides 113.    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   meloxicam  (MOBIC ) 15 MG tablet Take 1 tablet (15 mg total) by mouth daily.     Allergies:   Keflex [cephalexin] and Naproxen   Social History   Socioeconomic History   Marital status: Married    Spouse name: Not on file   Number of children: Not on file   Years of education: Not on file   Highest education level: Not on file  Occupational History   Not on file  Tobacco Use   Smoking status: Never   Smokeless tobacco: Never  Vaping Use   Vaping status: Never Used  Substance and Sexual Activity   Alcohol use: No    Alcohol/week: 0.0 standard  drinks of alcohol    Comment: rare   Drug use: No   Sexual activity: Yes    Birth control/protection: Post-menopausal    Comment: 1st intercourse 71 yo-1 partner  Other Topics Concern   Not on file  Social History Narrative   Not on file   Social Drivers of Health   Financial Resource Strain: Not on file  Food Insecurity: Not on file  Transportation Needs: Not on file  Physical Activity: Not on file  Stress: Not on file  Social Connections: Unknown (09/05/2023)   Received [facility]   Social Network    Social Network: Not on file     Family History: The patient's family history includes Breast cancer in her maternal aunt; Cancer in her paternal grandmother; Diabetes in her maternal grandmother; Heart disease in her father; Hypertension in her father; Thyroid  disease in her mother. ROS:   Please see the history of present illness.    All 14 point review of systems negative except  as described per history of present illness.  EKGs/Labs/Other Studies Reviewed:    The following studies were reviewed today:   EKG:  EKG Interpretation Date/Time:  Thursday December 14 2023 08:11:46 EST Ventricular Rate:  88 PR Interval:  150 QRS Duration:  94 QT Interval:  368 QTC Calculation: 445 R Axis:   25  Text Interpretation: Normal sinus rhythm Incomplete right bundle branch block Possible Inferior infarct , age undetermined Cannot rule out Anterior infarct , age undetermined No previous ECGs available Confirmed by Liborio Hai reddy [phone]) on 12/14/2023 8:21:49 AM    Recent Labs: 03/03/2023: ALT 12; BUN 13; Creat 0.73; Hemoglobin 13.5; Platelets 541; Potassium 4.7; Sodium 142; TSH 0.47  Recent Lipid Panel    Component Value Date/Time   CHOL 206 (H) 03/03/2023 0912   CHOL 189 01/13/2020 1618   TRIG 113 03/03/2023 0912   HDL 60 03/03/2023 0912   HDL 68 01/13/2020 1618   CHOLHDL 3.4 03/03/2023 0912   VLDL 14 01/26/2017 0935   LDLCALC 124 (H) 03/03/2023 0912     Physical Exam:    VS:  BP 126/88   Pulse 88   Ht 5' 1 (1.549 m)   Wt 198 lb (89.8 kg)   LMP 11/17/2015   SpO2 94%   BMI 37.41 kg/m     Wt Readings from Last 3 Encounters:  12/14/23 198 lb (89.8 kg)  10/20/23 199 lb 0.6 oz (90.3 kg)  03/03/23 196 lb 3.2 oz (89 kg)     GENERAL:  Well nourished, well developed in no acute distress NECK: No JVD; No carotid bruits CARDIAC: RRR, S1 and S2 present, no murmurs, no rubs, no gallops CHEST:  Clear to auscultation without rales, wheezing or rhonchi  Extremities: No pitting pedal edema. Pulses bilaterally symmetric with radial 2+ and dorsalis pedis 2+ NEUROLOGIC:  Alert and oriented x 3  Medication Adjustments/Labs and Tests Ordered: Current medicines are reviewed at length with the patient today.  Concerns regarding medicines are outlined above.  Orders Placed This Encounter  Procedures   EKG 12-Lead   No orders of the defined types were placed in this encounter.   Signed, Hai jess Liborio, MD, MPH, [facility]. 12/14/2023 8:38 AM    [facility]

## 2023-12-14 NOTE — Assessment & Plan Note (Signed)
 Reviewed findings on EKG. Reviewed how this could be a normal variant or a benign finding in people with obesity, sleep apnea, pulmonary issues.  She does not have any significant ongoing cardiac symptoms at this time. This time given no significant concerning symptoms and no significant physical exam findings, no further cardiac workup is indicated.  Advised about healthy dietary and lifestyle habits and monitoring for any changes in her functional capacity, or any other symptoms suggestive of cardiac etiology such as shortness of breath, lightheadedness, palpitations, chest pain, decreased functional capacity or easy fatigability.  She acknowledges and at this time I would hold off on further testing and recommend follow-up with us  on an as-needed basis.

## 2023-12-14 NOTE — Patient Instructions (Signed)
 Medication Instructions:  Your physician recommends that you continue on your current medications as directed. Please refer to the Current Medication list given to you today.  *If you need a refill on your cardiac medications before your next appointment, please call your pharmacy*   Lab Work: None ordered If you have labs (blood work) drawn today and your tests are completely normal, you will receive your results only by: MyChart Message (if you have MyChart) OR A paper copy in the mail If you have any lab test that is abnormal or we need to change your treatment, we will call you to review the results.   Testing/Procedures: None ordered   Follow-Up: At [facility], you and your health needs are our priority.  As part of our continuing mission to provide you with exceptional heart care, we have created designated Provider Care Teams.  These Care Teams include your primary Cardiologist (physician) and Advanced Practice Providers (APPs -  Physician Assistants and Nurse Practitioners) who all work together to provide you with the care you need, when you need it.  We recommend signing up for the patient portal called "MyChart".  Sign up information is provided on this After Visit Summary.  MyChart is used to connect with patients for Virtual Visits (Telemedicine).  Patients are able to view lab/test results, encounter notes, upcoming appointments, etc.  Non-urgent messages can be sent to your provider as well.   To learn more about what you can do with MyChart, go to [email or web address].    Your next appointment:   As needed  The format for your next appointment:   In Person  Provider:   Huntley Dec, MD    Other Instructions none  Important Information About Sugar

## 2023-12-29 DIAGNOSIS — M25551 Pain in right hip: Secondary | ICD-10-CM | POA: Diagnosis not present

## 2023-12-29 DIAGNOSIS — M9903 Segmental and somatic dysfunction of lumbar region: Secondary | ICD-10-CM | POA: Diagnosis not present

## 2023-12-29 DIAGNOSIS — M9905 Segmental and somatic dysfunction of pelvic region: Secondary | ICD-10-CM | POA: Diagnosis not present

## 2023-12-29 DIAGNOSIS — M5441 Lumbago with sciatica, right side: Secondary | ICD-10-CM | POA: Diagnosis not present

## 2024-01-03 DIAGNOSIS — M25551 Pain in right hip: Secondary | ICD-10-CM | POA: Diagnosis not present

## 2024-01-03 DIAGNOSIS — M9903 Segmental and somatic dysfunction of lumbar region: Secondary | ICD-10-CM | POA: Diagnosis not present

## 2024-01-03 DIAGNOSIS — M9905 Segmental and somatic dysfunction of pelvic region: Secondary | ICD-10-CM | POA: Diagnosis not present

## 2024-01-03 DIAGNOSIS — M5441 Lumbago with sciatica, right side: Secondary | ICD-10-CM | POA: Diagnosis not present

## 2024-01-05 ENCOUNTER — Ambulatory Visit: Payer: Federal, State, Local not specified - PPO

## 2024-01-05 DIAGNOSIS — F329 Major depressive disorder, single episode, unspecified: Secondary | ICD-10-CM | POA: Diagnosis not present

## 2024-01-08 ENCOUNTER — Ambulatory Visit: Payer: Federal, State, Local not specified - PPO

## 2024-01-13 DIAGNOSIS — M9903 Segmental and somatic dysfunction of lumbar region: Secondary | ICD-10-CM | POA: Diagnosis not present

## 2024-01-13 DIAGNOSIS — M5441 Lumbago with sciatica, right side: Secondary | ICD-10-CM | POA: Diagnosis not present

## 2024-01-13 DIAGNOSIS — M9905 Segmental and somatic dysfunction of pelvic region: Secondary | ICD-10-CM | POA: Diagnosis not present

## 2024-01-13 DIAGNOSIS — M25551 Pain in right hip: Secondary | ICD-10-CM | POA: Diagnosis not present

## 2024-01-14 ENCOUNTER — Other Ambulatory Visit: Payer: Self-pay | Admitting: Medical-Surgical

## 2024-01-14 DIAGNOSIS — M25561 Pain in right knee: Secondary | ICD-10-CM

## 2024-01-19 ENCOUNTER — Ambulatory Visit
Admission: RE | Admit: 2024-01-19 | Discharge: 2024-01-19 | Disposition: A | Discharge status: Home / Self Care | Source: Ambulatory Visit | Attending: Medical-Surgical | Admitting: Medical-Surgical

## 2024-01-19 DIAGNOSIS — M9903 Segmental and somatic dysfunction of lumbar region: Secondary | ICD-10-CM | POA: Diagnosis not present

## 2024-01-19 DIAGNOSIS — M25551 Pain in right hip: Secondary | ICD-10-CM | POA: Diagnosis not present

## 2024-01-19 DIAGNOSIS — Z1231 Encounter for screening mammogram for malignant neoplasm of breast: Secondary | ICD-10-CM | POA: Diagnosis not present

## 2024-01-19 DIAGNOSIS — M5441 Lumbago with sciatica, right side: Secondary | ICD-10-CM | POA: Diagnosis not present

## 2024-01-19 DIAGNOSIS — M9905 Segmental and somatic dysfunction of pelvic region: Secondary | ICD-10-CM | POA: Diagnosis not present

## 2024-01-23 ENCOUNTER — Encounter: Payer: Self-pay | Admitting: Medical-Surgical

## 2024-01-29 DIAGNOSIS — M25551 Pain in right hip: Secondary | ICD-10-CM | POA: Diagnosis not present

## 2024-01-29 DIAGNOSIS — M9903 Segmental and somatic dysfunction of lumbar region: Secondary | ICD-10-CM | POA: Diagnosis not present

## 2024-01-29 DIAGNOSIS — M5441 Lumbago with sciatica, right side: Secondary | ICD-10-CM | POA: Diagnosis not present

## 2024-01-29 DIAGNOSIS — M9905 Segmental and somatic dysfunction of pelvic region: Secondary | ICD-10-CM | POA: Diagnosis not present

## 2024-02-02 DIAGNOSIS — M5441 Lumbago with sciatica, right side: Secondary | ICD-10-CM | POA: Diagnosis not present

## 2024-02-02 DIAGNOSIS — M9905 Segmental and somatic dysfunction of pelvic region: Secondary | ICD-10-CM | POA: Diagnosis not present

## 2024-02-02 DIAGNOSIS — M9903 Segmental and somatic dysfunction of lumbar region: Secondary | ICD-10-CM | POA: Diagnosis not present

## 2024-02-02 DIAGNOSIS — M25551 Pain in right hip: Secondary | ICD-10-CM | POA: Diagnosis not present

## 2024-02-13 DIAGNOSIS — M25551 Pain in right hip: Secondary | ICD-10-CM | POA: Diagnosis not present

## 2024-02-13 DIAGNOSIS — M9903 Segmental and somatic dysfunction of lumbar region: Secondary | ICD-10-CM | POA: Diagnosis not present

## 2024-02-13 DIAGNOSIS — M5441 Lumbago with sciatica, right side: Secondary | ICD-10-CM | POA: Diagnosis not present

## 2024-02-13 DIAGNOSIS — M9905 Segmental and somatic dysfunction of pelvic region: Secondary | ICD-10-CM | POA: Diagnosis not present

## 2024-02-16 DIAGNOSIS — M5441 Lumbago with sciatica, right side: Secondary | ICD-10-CM | POA: Diagnosis not present

## 2024-02-16 DIAGNOSIS — M9903 Segmental and somatic dysfunction of lumbar region: Secondary | ICD-10-CM | POA: Diagnosis not present

## 2024-02-16 DIAGNOSIS — M25551 Pain in right hip: Secondary | ICD-10-CM | POA: Diagnosis not present

## 2024-02-16 DIAGNOSIS — M9905 Segmental and somatic dysfunction of pelvic region: Secondary | ICD-10-CM | POA: Diagnosis not present

## 2024-02-18 DIAGNOSIS — W01190A Fall on same level from slipping, tripping and stumbling with subsequent striking against furniture, initial encounter: Secondary | ICD-10-CM | POA: Diagnosis not present

## 2024-02-18 DIAGNOSIS — S3992XA Unspecified injury of lower back, initial encounter: Secondary | ICD-10-CM | POA: Diagnosis not present

## 2024-02-18 DIAGNOSIS — R296 Repeated falls: Secondary | ICD-10-CM | POA: Diagnosis not present

## 2024-02-18 DIAGNOSIS — M545 Low back pain, unspecified: Secondary | ICD-10-CM | POA: Diagnosis not present

## 2024-02-18 DIAGNOSIS — Y92009 Unspecified place in unspecified non-institutional (private) residence as the place of occurrence of the external cause: Secondary | ICD-10-CM | POA: Diagnosis not present

## 2024-02-18 DIAGNOSIS — W19XXXA Unspecified fall, initial encounter: Secondary | ICD-10-CM | POA: Diagnosis not present

## 2024-02-26 ENCOUNTER — Ambulatory Visit

## 2024-02-26 ENCOUNTER — Ambulatory Visit: Admitting: Medical-Surgical

## 2024-02-26 ENCOUNTER — Encounter: Payer: Self-pay | Admitting: Medical-Surgical

## 2024-02-26 VITALS — BP 112/72 | HR 107 | Resp 20 | Ht 61.0 in | Wt 192.1 lb

## 2024-02-26 DIAGNOSIS — G8929 Other chronic pain: Secondary | ICD-10-CM | POA: Diagnosis not present

## 2024-02-26 DIAGNOSIS — M25461 Effusion, right knee: Secondary | ICD-10-CM | POA: Diagnosis not present

## 2024-02-26 DIAGNOSIS — M7989 Other specified soft tissue disorders: Secondary | ICD-10-CM | POA: Diagnosis not present

## 2024-02-26 DIAGNOSIS — M25561 Pain in right knee: Secondary | ICD-10-CM

## 2024-02-26 DIAGNOSIS — M1711 Unilateral primary osteoarthritis, right knee: Secondary | ICD-10-CM | POA: Diagnosis not present

## 2024-02-26 DIAGNOSIS — Z09 Encounter for follow-up examination after completed treatment for conditions other than malignant neoplasm: Secondary | ICD-10-CM

## 2024-02-26 DIAGNOSIS — M545 Low back pain, unspecified: Secondary | ICD-10-CM

## 2024-02-26 MED ORDER — METHYLPREDNISOLONE ACETATE 80 MG/ML IJ SUSP
80.0000 mg | Freq: Once | INTRAMUSCULAR | Status: AC
  Administered 2024-02-26: 80 mg via INTRAMUSCULAR

## 2024-02-26 NOTE — Progress Notes (Signed)
        Established patient visit  History, exam, impression, and plan:  1. Hospital discharge follow-up (Primary) 2. Acute right-sided low back pain without sciatica Very pleasant 63 year old female accompanied by her husband presenting today for hospital discharge follow-up where she was seen for acute right sided low back pain without sciatica.  Reports that she was in bed on Sunday afternoon taking a nap with her back facing the outside of the room rather than the wall.  When she was lying there, she flipped over and fell out of the bed.  She hit a table on the way down and landed flat on her back.  She was seen in the ED where they performed a comprehensive workup including x-rays.  There were no abnormal findings on the x-rays and she was sent home with Flexeril and a lidocaine patch.  Reports that those measures have not done much of anything to help with her pain and she has had significant difficulty with range of motion and severe pain.  Reports that she is still experiencing pain at a 6/10 while at rest without radiation.  Denies weakness, numbness/tingling, new onset incontinence, and saddle paresthesias.  She does see chiropractor however this has not been helpful for her new injury.  On evaluation, she has tenderness and swelling to the right lumbar paraspinal muscles.  Moving cautiously, slow due to pain.  She is currently taking meloxicam  15 mg daily which she has found to be somewhat helpful.  Discussed recommendations for conservative treatment.  Offered a steroid burst however she does not tolerate these well.  She is agreeable to Depo-Medrol  injection so giving 80 mg IM x 1 today.  Referring to physical therapy per her request.  Okay to use ice/heat, massage, topical analgesics, and Tylenol.  Recommend starting physical therapy and returning to see Dr. Sandy Crumb for follow-up after 6 weeks of conservative measures if pain is not better. - Ambulatory referral to Physical Therapy -  methylPREDNISolone  acetate (DEPO-MEDROL ) injection 80 mg  3. Chronic pain of right knee Reports a long history of chronic pain of the right knee that has worsened since her fall from the bed.  Reports that knee has been a problem for a very long time and often swells.  She is aware that her weight gain does not help with her knee problems and is interested in possibly starting medication for weight loss if covered by her insurance.  Advised her to contact her insurance company to determine if there were any weight loss medications covered under her plan and let me know what she finds out.  On evaluation, no joint effusion identified however exam is limited by current levels of pain in her back and her knee.  Plan for x-rays for further evaluation.  Depending on findings, she may benefit from discussion with Dr. Sandy Crumb regarding injections.  Recommend scheduling a follow-up appointment with him to review options. - DG Knee Complete 4 Views Right; Future  Procedures performed this visit: None.  Return in about 6 weeks (around 04/08/2024) for back/right knee pain follow up with Dr. Sandy Crumb.  __________________________________ Carol Snook, DNP, APRN, FNP-BC Primary Care and Sports Medicine [facility] [place]

## 2024-02-27 ENCOUNTER — Encounter: Payer: Self-pay | Admitting: Medical-Surgical

## 2024-02-28 ENCOUNTER — Ambulatory Visit: Attending: Medical-Surgical | Admitting: Rehabilitative and Restorative Service Providers"

## 2024-02-28 ENCOUNTER — Encounter: Payer: Self-pay | Admitting: Rehabilitative and Restorative Service Providers"

## 2024-02-28 ENCOUNTER — Other Ambulatory Visit: Payer: Self-pay

## 2024-02-28 DIAGNOSIS — R29898 Other symptoms and signs involving the musculoskeletal system: Secondary | ICD-10-CM | POA: Diagnosis not present

## 2024-02-28 DIAGNOSIS — M5459 Other low back pain: Secondary | ICD-10-CM | POA: Insufficient documentation

## 2024-02-28 DIAGNOSIS — M6281 Muscle weakness (generalized): Secondary | ICD-10-CM | POA: Diagnosis not present

## 2024-02-28 DIAGNOSIS — M25561 Pain in right knee: Secondary | ICD-10-CM | POA: Insufficient documentation

## 2024-02-28 DIAGNOSIS — M545 Low back pain, unspecified: Secondary | ICD-10-CM | POA: Diagnosis not present

## 2024-02-28 DIAGNOSIS — R2689 Other abnormalities of gait and mobility: Secondary | ICD-10-CM | POA: Diagnosis not present

## 2024-02-28 DIAGNOSIS — G8929 Other chronic pain: Secondary | ICD-10-CM | POA: Insufficient documentation

## 2024-02-28 NOTE — Therapy (Signed)
 OUTPATIENT PHYSICAL THERAPY THORACOLUMBAR EVALUATION   Patient Name: Carol Franklin MRN: [id number] DOB:1961-04-25, 63 y.o., female Today's Date: 02/28/2024  END OF SESSION:  PT End of Session - 02/28/24 1250     Visit Number 1    Number of Visits 16    Date for PT Re-Evaluation 04/24/24    Authorization Type [employer] fereral plan copay $[id number]    Authorization Time Period year - 50 visits per year - 37remaining    Authorization - Visit Number 1    Authorization - Number of Visits 37    PT Start Time 1100    PT Stop Time 1145    PT Time Calculation (min) 45 min    Activity Tolerance Patient tolerated treatment well             Past Medical History:  Diagnosis Date   Abnormal EKG    BMI 35.0-35.9,adult 01/06/2012   Chronic pain of both knees 01/13/2020   Chronic right-sided low back pain with right-sided sciatica 10/21/2020   Eczema 02/14/2020   Essential thrombocytosis (HCC) 01/13/2012   Fatigue 10/21/2020   Hyperlipidemia 01/13/2012   Hypertension    Intertrigo    Loud snoring 10/21/2020   Menopausal hot flushes 09/17/2014   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/2012   Osteopenia 12/04/2015   Person encountering health services to consult on behalf of another person 03/03/2023   Post-operative nausea and vomiting    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   Weight gain 02/14/2020   Past Surgical History:  Procedure Laterality Date   CESAREAN SECTION     X2   DILATION AND CURETTAGE OF UTERUS     KNEE SURGERY     left   Patient Active Problem List   Diagnosis Date Noted   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   Post-operative nausea and vomiting    Intertrigo    Hypertension    Abnormal EKG    Person encountering health services to consult on behalf of another person 03/03/2023   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   Fatigue 10/21/2020   Chronic right-sided low back pain with right-sided sciatica 10/21/2020   Loud snoring 10/21/2020   Eczema 02/14/2020   Weight  gain 02/14/2020   Chronic pain of both knees 01/13/2020   Osteopenia 12/04/2015   Menopausal hot flushes 09/17/2014   Essential thrombocytosis (HCC) 01/13/2012   Hyperlipidemia 01/13/2012   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/2012   BMI 35.0-35.9,adult 01/06/2012    PCP: Cherre Cornish, NP  REFERRING PROVIDER: Cherre Cornish, NP  REFERRING DIAG: R sided LBP; chronic R knee   Rationale for Evaluation and Treatment: Rehabilitation  THERAPY DIAG:  Other low back pain  Other symptoms and signs involving the musculoskeletal system  Chronic pain of right knee  Muscle weakness (generalized)  Other abnormalities of gait and mobility  ONSET DATE: 02/18/24  SUBJECTIVE:  SUBJECTIVE STATEMENT: Patient reports that she was napping 02/18/24 on her sofa and awoke and stumbled backward hitting her R LB to R hip. She has noted some improvement but is still having pain the back and hip. She has a history R knee pain for the past 5 years. She has had some PT for the R knee but did not continue to exercise. She has gained ~ 20 pounds in the past year.   PERTINENT HISTORY:  Denies medical problems; L knee injury not on R   PAIN:  Are you having pain? Yes: NPRS scale: 7/10 Pain location: R LB to R posterior hip  Pain description: sharp sometimes; dull; aching  Aggravating factors: standing > 5 min; walking > 3 min; sitting > 15 min Relieving factors: lying on L side; ice   Are you having pain? Yes: NPRS scale: 7/10  Pain location: inside area of the R knee and below the kneecap  Pain description: sharp; achy  Aggravating factors: sleeping; moving on the knee - standing or walking  Relieving factors: ice   PRECAUTIONS: None  RED FLAGS: None   WEIGHT BEARING RESTRICTIONS: No  FALLS:  Has patient fallen in last 6 months?  Yes. 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  DIAGNOSTIC FINDINGS:  Xray 02/26/24 - results not available   PATIENT SURVEYS:  Modified Oswestry 37/50; 74%   COGNITION: Overall cognitive status: Within functional limits for tasks assessed     SENSATION: WFL  MUSCLE LENGTH: Hamstrings: Right 55 deg; Left 55 deg Thomas test: tight hip flexors   POSTURE: rounded shoulders, forward head, increased thoracic kyphosis, and flexed trunk   PALPATION: Tender and tight R posterior hip and lumbar paraspinals to lats - transverse abdominals   MOBILITY/TRANSFERS;  Patient with difficulty with all transitional movements sit to stand; stand to sit; sit to supine; rolling to either side; unable to assume prone position   LUMBAR ROM:   AROM eval  Flexion 30% hands on thighs pain R LB  Extension 20% painful   Right lateral flexion 60% discomfort   Left lateral flexion 55% discomfort   Right rotation 10% stiff/tight   Left rotation 10% stiff/tight    (Blank rows = not tested)  LOWER EXTREMITY ROM:   limited throughout bilat LE's R > L   Active  Right eval Left eval  Hip flexion 100 95  Hip extension Unable to achieve neutral  Unable to achieve neutral  Hip abduction    Hip adduction    Hip internal rotation    Hip external rotation    Knee flexion -22 -20  Knee extension 89 78  Ankle  dorsiflexion    Ankle plantarflexion    Ankle inversion    Ankle eversion     (Blank rows = not tested)  LOWER EXTREMITY MMT:  difficult to assess strength - patient mobility limited   MMT Right eval Left eval  Hip flexion 4- 4-  Hip extension    Hip abduction 3+ 3+  Hip adduction    Hip internal rotation    Hip external rotation    Knee flexion 4 4-  Knee extension 4 4-  Ankle dorsiflexion    Ankle plantarflexion    Ankle inversion    Ankle eversion     (Blank rows = not tested)  LUMBAR SPECIAL TESTS:  Straight leg raise test: Negative and Slump test: Negative  FUNCTIONAL TESTS:  5 times sit to stand: unable to assess   GAIT: Distance walked: 40 feet  Assistive device utilized: Single point cane which was too high for patient  Level of assistance: Complete Independence Comments: antalgic gait; trunk, hips, knees flexed; decreased wt bearing R LE with stance to push off  TREATMENT DATE: 02/28/24 Adjusted cane and instructed in 3 point gait level surfaces     Encouraged movement including HEP; beginning a walking program ~ 3 times/day starting with 3 -5 min Review of POC and goals                                                                                                                               PATIENT EDUCATION:  Education details: POC; HEP  Person educated: Patient Education method: Explanation, Demonstration, Tactile cues, Verbal cues, and Handouts Education comprehension: verbalized understanding, returned demonstration, verbal cues required, tactile cues required, and needs further education  HOME EXERCISE PROGRAM: Access Code: [email or web address] URL: [email or web address] Date: 02/28/2024 Prepared by: Montoya Brandel  Exercises - Supine March  - 2 x daily - 7 x weekly - 1-2 sets - 3 reps - 2 sec  hold - Supine Lower Trunk Rotation  - 2 x daily - 7 x weekly - 1 sets - 3-5 reps - 20-30 sec  hold - Sidelying Hip Abduction  - 1 x daily - 7 x weekly  - 3 sets - 10 reps - 3-5 sec  hold - Clamshell  - 2 x daily - 7 x weekly - 1 sets - 10 reps - 2-3 sec  hold - Sidelying Reverse Clamshell  - 2 x daily - 7 x weekly - 1-2 sets - 10 reps - 2-3 sec  hold  ASSESSMENT:  CLINICAL IMPRESSION: Patient is a 63 y.o. female who was seen today for physical therapy evaluation and treatment for R sided LBP; chronic R knee pain. Patient reports that she fell back into her coffee tale after a nap 02/18/24 striking her R LB and posterior hip. She has had severe pain in the R back and hip since that time. She reports chronic R knee pain in the past 5 years. She has a history of L knee surgery ? Date and continues to have limited ROM and strength L LE. Patient has forward flexed posture and alignment; difficulty with all transfers and transitional movements; limited trunk and LE mobility/ROM; decreased core and LE strength; abnormal and unsafe gait pattern with cane that was too tall  for her(adjusted today and pt was instructed in proper three point gait pattern). Patient has pain limiting all functional activities. She will benefit from PT  to address problems identified.   OBJECTIVE IMPAIRMENTS: Abnormal gait, decreased activity tolerance, decreased balance, decreased mobility, difficulty walking, decreased ROM, decreased strength, impaired flexibility, improper body mechanics, postural dysfunction, obesity, and pain.   ACTIVITY LIMITATIONS: carrying, lifting, bending, sitting, standing, squatting, sleeping, stairs, transfers, bed mobility, bathing, toileting, dressing, and locomotion level  PARTICIPATION LIMITATIONS: meal prep, cleaning, laundry, driving, shopping, community activity, and occupation  PERSONAL FACTORS: Behavior pattern, Fitness, Past/current experiences, Time since onset of injury/illness/exacerbation, and comorbidities are also affecting patient's functional outcome.   REHAB POTENTIAL: Good  CLINICAL DECISION MAKING: Evolving/moderate  complexity  EVALUATION COMPLEXITY: Moderate   GOALS: Goals reviewed with patient? Yes  SHORT TERM GOALS: Target date: 03/27/2024   Independent in initial HEP  Baseline: Goal status: INITIAL  2.  Safe gait for home and short distances of 50-100 ft with appropriate assistive device  Baseline:  Goal status: INITIAL  3.  Decrease pain by 25-50% allowing patient to increase normal activities  Baseline:  Goal status: INITIAL    LONG TERM GOALS: Target date: 04/24/2024   Increase trunk ROM to 50-70% with minimal to no pain  Baseline:  Goal status: INITIAL  2.  Patient reports ability to sit for 30 min and stand for 10 min without increased pain  Baseline:  Goal status: INITIAL  3.  Increased ROM bilt knee extension to (-)10 degrees and flexion to 100 deg R and 90 deg L  Baseline:  Goal status: INITIAL  4.  Increase strength to 4/5 to 4+/5 bialt LE's  Baseline:  Goal status: INITIAL  5.  Independent in HEP including aquatic program as indicated  Baseline:  Goal status: INITIAL  6.  Improve modified Owestry score by 10-15 points  Baseline: 37/50; 74%  Goal status: INITIAL  PLAN:  PT FREQUENCY: 2x/week  PT DURATION: 8 weeks  PLANNED INTERVENTIONS: 97110-Therapeutic exercises, 97530- Therapeutic activity, 97112- Neuromuscular re-education, 97535- Self Care, [id number]- Manual therapy, [phone]- Gait training, [phone]- Aquatic Therapy, Patient/Family education, Balance training, Stair training, Taping, Dry Needling, and Joint mobilization.  PLAN FOR NEXT SESSION: review and progress exercises; continue with spine care and ergonomic education; manual work and modalities as indicated    Daveyon Kitchings P Jaquon Gingerich, PT 02/28/2024, 12:52 PM

## 2024-02-29 ENCOUNTER — Other Ambulatory Visit: Payer: Self-pay | Admitting: Medical-Surgical

## 2024-02-29 DIAGNOSIS — M25561 Pain in right knee: Secondary | ICD-10-CM

## 2024-03-04 ENCOUNTER — Ambulatory Visit: Admitting: Rehabilitative and Restorative Service Providers"

## 2024-03-04 ENCOUNTER — Encounter: Payer: Self-pay | Admitting: Rehabilitative and Restorative Service Providers"

## 2024-03-04 DIAGNOSIS — M25561 Pain in right knee: Secondary | ICD-10-CM | POA: Diagnosis not present

## 2024-03-04 DIAGNOSIS — M5459 Other low back pain: Secondary | ICD-10-CM

## 2024-03-04 DIAGNOSIS — R29898 Other symptoms and signs involving the musculoskeletal system: Secondary | ICD-10-CM

## 2024-03-04 DIAGNOSIS — R2689 Other abnormalities of gait and mobility: Secondary | ICD-10-CM | POA: Diagnosis not present

## 2024-03-04 DIAGNOSIS — G8929 Other chronic pain: Secondary | ICD-10-CM

## 2024-03-04 DIAGNOSIS — M545 Low back pain, unspecified: Secondary | ICD-10-CM | POA: Diagnosis not present

## 2024-03-04 DIAGNOSIS — M6281 Muscle weakness (generalized): Secondary | ICD-10-CM

## 2024-03-04 NOTE — Therapy (Signed)
 OUTPATIENT PHYSICAL THERAPY THORACOLUMBAR EVALUATION   Patient Name: Carol Franklin MRN: [id number] DOB:31-Mar-1961, 63 y.o., female Today's Date: 03/04/2024  END OF SESSION:  PT End of Session - 03/04/24 1109     Visit Number 2    Number of Visits 16    Date for PT Re-Evaluation 04/24/24    Authorization Type [employer] fereral plan copay $35    Authorization Time Period year - 50 visits per year - 37remaining    Authorization - Visit Number 2    Authorization - Number of Visits 37    PT Start Time 1105    PT Stop Time 1145    PT Time Calculation (min) 40 min    Activity Tolerance Patient tolerated treatment well             Past Medical History:  Diagnosis Date   Abnormal EKG    BMI 35.0-35.9,adult 01/06/2012   Chronic pain of both knees 01/13/2020   Chronic right-sided low back pain with right-sided sciatica 10/21/2020   Eczema 02/14/2020   Essential thrombocytosis (HCC) 01/13/2012   Fatigue 10/21/2020   Hyperlipidemia 01/13/2012   Hypertension    Intertrigo    Loud snoring 10/21/2020   Menopausal hot flushes 09/17/2014   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/2012   Osteopenia 12/04/2015   Person encountering health services to consult on behalf of another person 03/03/2023   Post-operative nausea and vomiting    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   Weight gain 02/14/2020   Past Surgical History:  Procedure Laterality Date   CESAREAN SECTION     X2   DILATION AND CURETTAGE OF UTERUS     KNEE SURGERY     left   Patient Active Problem List   Diagnosis Date Noted   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   Post-operative nausea and vomiting    Intertrigo    Hypertension    Abnormal EKG    Person encountering health services to consult on behalf of another person 03/03/2023   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   Fatigue 10/21/2020   Chronic right-sided low back pain with right-sided sciatica 10/21/2020   Loud snoring 10/21/2020   Eczema 02/14/2020   Weight  gain 02/14/2020   Chronic pain of both knees 01/13/2020   Osteopenia 12/04/2015   Menopausal hot flushes 09/17/2014   Essential thrombocytosis (HCC) 01/13/2012   Hyperlipidemia 01/13/2012   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/2012   BMI 35.0-35.9,adult 01/06/2012    PCP: Cherre Cornish, NP  REFERRING PROVIDER: Cherre Cornish, NP  REFERRING DIAG: R sided LBP; chronic R knee   Rationale for Evaluation and Treatment: Rehabilitation  THERAPY DIAG:  Other low back pain  Other symptoms and signs involving the musculoskeletal system  Chronic pain of right knee  Muscle weakness (generalized)  Other abnormalities of gait and mobility  ONSET DATE: 02/18/24  SUBJECTIVE:  SUBJECTIVE STATEMENT: Patient reports that she is feeling "a lot better". She continues to have pain in the R LB and R knee. She is able to turn in bed now. The knee is still "terrible". Hard to stand up on that R knee. Has not worked on exercises at home.   EVAL: Patient reports that she was napping 02/18/24 on her sofa and awoke and stumbled backward hitting her R LB to R hip. She has noted some improvement but is still having pain the back and hip. She has a history R knee pain for the past 5 years. She has had some PT for the R knee but did not continue to exercise. She has gained ~ 20 pounds in the past year.   PERTINENT HISTORY:  Denies medical problems; L knee injury not on R   PAIN:  Are you having pain? Yes: NPRS scale: 6/10 Pain location: R LB to R posterior hip  Pain description: sharp sometimes; dull; aching  Aggravating factors: standing > 5 min; walking > 3 min; sitting > 15 min Relieving factors: lying on L side; ice   Are you having pain? Yes: NPRS scale: 7/10  Pain location: inside area of the R knee and below the kneecap  Pain  description: sharp; achy  Aggravating factors: sleeping; moving on the knee - standing or walking  Relieving factors: ice   PRECAUTIONS: None   WEIGHT BEARING RESTRICTIONS: No  FALLS:  Has patient fallen in last 6 months? Yes. 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  DIAGNOSTIC FINDINGS:  Xray 02/26/24 - results not available   PATIENT SURVEYS:  Modified Oswestry 37/50; 74%    SENSATION: WFL  MUSCLE LENGTH: Hamstrings: Right 55 deg; Left 55 deg Thomas test: tight hip flexors   POSTURE: rounded shoulders, forward head, increased thoracic kyphosis, and flexed trunk   PALPATION: Tender and tight R posterior hip and lumbar paraspinals to lats - transverse abdominals   MOBILITY/TRANSFERS;  Patient with difficulty with all transitional movements sit to stand; stand to sit; sit to supine; rolling to either side; unable to assume prone position   LUMBAR ROM:   AROM eval  Flexion 30% hands on thighs pain R LB  Extension 20% painful   Right lateral flexion 60% discomfort   Left lateral flexion 55% discomfort   Right rotation 10% stiff/tight   Left rotation 10% stiff/tight    (Blank rows = not tested)  LOWER EXTREMITY ROM:   limited throughout bilat LE's R > L   Active  Right eval Left eval  Hip flexion 100 95  Hip extension Unable to achieve neutral  Unable to achieve neutral  Hip abduction    Hip  adduction    Hip internal rotation    Hip external rotation    Knee flexion -22 -20  Knee extension 89 78  Ankle dorsiflexion    Ankle plantarflexion    Ankle inversion    Ankle eversion     (  Blank rows = not tested)  LOWER EXTREMITY MMT:  difficult to assess strength - patient mobility limited   MMT Right eval Left eval  Hip flexion 4- 4-  Hip extension    Hip abduction 3+ 3+  Hip adduction    Hip internal rotation    Hip external rotation    Knee flexion 4 4-  Knee extension 4 4-  Ankle dorsiflexion    Ankle plantarflexion    Ankle inversion    Ankle eversion     (Blank rows = not tested)  LUMBAR SPECIAL TESTS:  Straight leg raise test: Negative and Slump test: Negative  FUNCTIONAL TESTS:  5 times sit to stand: unable to assess   GAIT: Distance walked: 40 feet  Assistive device utilized: Single point cane which was too high for patient  Level of assistance: Complete Independence Comments: antalgic gait; trunk, hips, knees flexed; decreased wt bearing R LE with stance to push off  [facility] Adult PT Treatment:                                                DATE: 03/04/24 Therapeutic Exercise: Supine Marching x 10 R/L Trunk rotation x 10 R/L  Quad set knee resting on pillow 5 sec x 10 R  SRL from quad set 3 sec x 10 R Hamstring stretch hooklying with strap 30 sec x 3 R   Sidelying   Hip abduction x 10 R/L  Clamshell x 10 R/L  Reverse clamshell x 10 R/L  Neuromuscular re-ed: Encouraged upright posture and alignment with core engaged  Therapeutic Activity: Supine Diaphragmatic breathing count of 6 x 10  Bridge ~ 1-2 sec x 10  Shoulder flexion in hooklying x 10 R/L  Sidelying  Working on rolling side to side for exercises and transitional movements/transfers  Sitting Lateral trunk flexion reaching hand over head x 3 each side  Sit to stand from elevated surface with use of UE's x 5  Standing  Self Care: Provided info on TENS unit    TREATMENT DATE:  02/28/24 Adjusted cane and instructed in 3 point gait level surfaces     Encouraged movement including HEP; beginning a walking program ~ 3 times/day starting with 3 -5 min Review of POC and goals                                                                                                                               PATIENT EDUCATION:  Education details: POC; HEP  Person educated: Patient Education method: Explanation, Demonstration, Tactile cues, Verbal cues, and Handouts Education comprehension: verbalized understanding, returned demonstration, verbal cues required, tactile cues required, and needs further education  HOME EXERCISE PROGRAM: Access Code: [email or web address] URL: [email or web address] Date: 03/04/2024 Prepared by: Brunetta Newingham  Exercises - Supine March  - 2 x daily -  7 x weekly - 1-2 sets - 3 reps - 2 sec  hold - Supine Lower Trunk Rotation  - 2 x daily - 7 x weekly - 1 sets - 3-5 reps - 20-30 sec  hold - Sidelying Hip Abduction  - 1 x daily - 7 x weekly - 3 sets - 10 reps - 3-5 sec  hold - Clamshell  - 2 x daily - 7 x weekly - 1 sets - 10 reps - 2-3 sec  hold - Sidelying Reverse Clamshell  - 2 x daily - 7 x weekly - 1-2 sets - 10 reps - 2-3 sec  hold - Supine Diaphragmatic Breathing  - 2 x daily - 7 x weekly - 1 sets - 10 reps - 4-6 sec  hold - Supine Quad Set  - 2 x daily - 7 x weekly - 1 sets - 10 reps - 3 sec  hold - Small Range Straight Leg Raise  - 2 x daily - 7 x weekly - 1 sets - 10 reps - 5 sec  hold - Hooklying Hamstring Stretch with Strap  - 2 x daily - 7 x weekly - 1 sets - 3 reps - 30 sec  hold - Supine Alternating Shoulder Flexion  - 2 x daily - 7 x weekly - 1-2 sets - 10 reps - 2 sec  hold  ASSESSMENT:  CLINICAL IMPRESSION: Patient returns reporting decreased pain and improved mobility. She continues to have LBP and R knee pain but severity of pain has decreased. She demonstrates improved mobility including transitional movements and transfers.  Reviewed and progressed exercises.   EVAL: Patient is a 63 y.o. female who was seen today for physical therapy evaluation and treatment for R sided LBP; chronic R knee pain. Patient reports that she fell back into her coffee tale after a nap 02/18/24 striking her R LB and posterior hip. She has had severe pain in the R back and hip since that time. She reports chronic R knee pain in the past 5 years. She has a history of L knee surgery ? Date and continues to have limited ROM and strength L LE. Patient has forward flexed posture and alignment; difficulty with all transfers and transitional movements; limited trunk and LE mobility/ROM; decreased core and LE strength; abnormal and unsafe gait pattern with cane that was too tall for her(adjusted today and pt was instructed in proper three point gait pattern). Patient has pain limiting all functional activities. She will benefit from PT  to address problems identified.    GOALS: Goals reviewed with patient? Yes  SHORT TERM GOALS: Target date: 03/27/2024   Independent in initial HEP  Baseline: Goal status: INITIAL  2.  Safe gait for home and short distances of 50-100 ft with appropriate assistive device  Baseline:  Goal status: INITIAL  3.  Decrease pain by 25-50% allowing patient to increase normal activities  Baseline:  Goal status: INITIAL    LONG TERM GOALS: Target date: 04/24/2024   Increase trunk ROM to 50-70% with minimal to no pain  Baseline:  Goal status: INITIAL  2.  Patient reports ability to sit for 30 min and stand for 10 min without increased pain  Baseline:  Goal status: INITIAL  3.  Increased ROM bilt knee extension to (-)10 degrees and flexion to 100 deg R and 90 deg L  Baseline:  Goal status: INITIAL  4.  Increase strength to 4/5 to 4+/5 bialt LE's  Baseline:  Goal status: INITIAL  5.  Independent in HEP including aquatic program as indicated  Baseline:  Goal status: INITIAL  6.  Improve modified Owestry  score by 10-15 points  Baseline: 37/50; 74%  Goal status: INITIAL  PLAN:  PT FREQUENCY: 2x/week  PT DURATION: 8 weeks  PLANNED INTERVENTIONS: 97110-Therapeutic exercises, 97530- Therapeutic activity, 97112- Neuromuscular re-education, 97535- Self Care, [id number]- Manual therapy, [phone]- Gait training, [phone]- Aquatic Therapy, Patient/Family education, Balance training, Stair training, Taping, Dry Needling, and Joint mobilization.  PLAN FOR NEXT SESSION: review and progress exercises; continue with spine care and ergonomic education; manual work and modalities as indicated    Sabrena Gavitt P Viann Nielson, PT 03/04/2024, 11:10 AM

## 2024-03-05 ENCOUNTER — Telehealth: Payer: Self-pay

## 2024-03-05 ENCOUNTER — Encounter: Payer: Self-pay | Admitting: Medical-Surgical

## 2024-03-05 DIAGNOSIS — E66812 Obesity, class 2: Secondary | ICD-10-CM

## 2024-03-05 NOTE — Telephone Encounter (Signed)
 Knee x-ray results in chart for review.  Please see patient message also requesting a rx for [facility]- do not see any medications of this type in patient chart currently

## 2024-03-05 NOTE — Telephone Encounter (Signed)
 Copied from CRM [id number]. Topic: Clinical - Lab/Test Results >> Mar 05, 2024 12:24 PM Shelby Dessert H wrote: Reason for CRM: Patient called to get the results of her imaging that was done and patient also wants to know if she can get a rx for [facility], patients callback number is [phone].

## 2024-03-06 ENCOUNTER — Other Ambulatory Visit (HOSPITAL_COMMUNITY): Payer: Self-pay

## 2024-03-06 ENCOUNTER — Telehealth: Payer: Self-pay

## 2024-03-06 MED ORDER — WEGOVY 0.25 MG/0.5ML ~~LOC~~ SOAJ
0.2500 mg | SUBCUTANEOUS | 0 refills | Status: DC
Start: 2024-03-06 — End: 2024-07-23

## 2024-03-06 NOTE — Telephone Encounter (Signed)
 Pharmacy Patient Advocate Encounter   Received notification from Patient Pharmacy that prior authorization for Wegovy 0.25 is required/requested.   Insurance verification completed.   The patient is insured through [employer] .   Per test claim: PA required; PA submitted to above mentioned insurance via CoverMyMeds Key/confirmation #/EOC [id number] Status is pending

## 2024-03-07 ENCOUNTER — Ambulatory Visit: Attending: Medical-Surgical

## 2024-03-07 ENCOUNTER — Other Ambulatory Visit: Payer: Self-pay | Admitting: Medical-Surgical

## 2024-03-07 ENCOUNTER — Other Ambulatory Visit (HOSPITAL_COMMUNITY): Payer: Self-pay

## 2024-03-07 DIAGNOSIS — M6281 Muscle weakness (generalized): Secondary | ICD-10-CM | POA: Diagnosis not present

## 2024-03-07 DIAGNOSIS — E66812 Obesity, class 2: Secondary | ICD-10-CM

## 2024-03-07 DIAGNOSIS — M25561 Pain in right knee: Secondary | ICD-10-CM | POA: Insufficient documentation

## 2024-03-07 DIAGNOSIS — G8929 Other chronic pain: Secondary | ICD-10-CM | POA: Insufficient documentation

## 2024-03-07 DIAGNOSIS — R29898 Other symptoms and signs involving the musculoskeletal system: Secondary | ICD-10-CM | POA: Diagnosis not present

## 2024-03-07 DIAGNOSIS — M5459 Other low back pain: Secondary | ICD-10-CM | POA: Insufficient documentation

## 2024-03-07 DIAGNOSIS — R2689 Other abnormalities of gait and mobility: Secondary | ICD-10-CM | POA: Insufficient documentation

## 2024-03-07 NOTE — Therapy (Signed)
 OUTPATIENT PHYSICAL THERAPY THORACOLUMBAR TREATMENT   Patient Name: Carol Franklin MRN: [id number] DOB:08/31/1961, 63 y.o., female Today's Date: 03/07/2024  END OF SESSION:  PT End of Session - 03/07/24 1316     Visit Number 3    Number of Visits 16    Date for PT Re-Evaluation 04/24/24    Authorization Type [employer] fereral plan copay $35    Authorization Time Period year - 50 visits per year - 37remaining    Authorization - Visit Number 3    Authorization - Number of Visits 37    PT Start Time 1315    PT Stop Time 1403    PT Time Calculation (min) 48 min    Activity Tolerance Patient tolerated treatment well    Behavior During Therapy WFL for tasks assessed/performed            Past Medical History:  Diagnosis Date   Abnormal EKG    BMI 35.0-35.9,adult 01/06/2012   Chronic pain of both knees 01/13/2020   Chronic right-sided low back pain with right-sided sciatica 10/21/2020   Eczema 02/14/2020   Essential thrombocytosis (HCC) 01/13/2012   Fatigue 10/21/2020   Hyperlipidemia 01/13/2012   Hypertension    Intertrigo    Loud snoring 10/21/2020   Menopausal hot flushes 09/17/2014   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/2012   Osteopenia 12/04/2015   Person encountering health services to consult on behalf of another person 03/03/2023   Post-operative nausea and vomiting    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   Weight gain 02/14/2020   Past Surgical History:  Procedure Laterality Date   CESAREAN SECTION     X2   DILATION AND CURETTAGE OF UTERUS     KNEE SURGERY     left   Patient Active Problem List   Diagnosis Date Noted   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   Post-operative nausea and vomiting    Intertrigo    Hypertension    Abnormal EKG    Person encountering health services to consult on behalf of another person 03/03/2023   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   Fatigue 10/21/2020   Chronic right-sided low back pain with right-sided sciatica 10/21/2020    Loud snoring 10/21/2020   Eczema 02/14/2020   Weight gain 02/14/2020   Chronic pain of both knees 01/13/2020   Osteopenia 12/04/2015   Menopausal hot flushes 09/17/2014   Essential thrombocytosis (HCC) 01/13/2012   Hyperlipidemia 01/13/2012   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/2012   BMI 35.0-35.9,adult 01/06/2012    PCP: Cherre Cornish, NP  REFERRING PROVIDER: Cherre Cornish, NP  REFERRING DIAG: R sided LBP; chronic R knee   Rationale for Evaluation and Treatment: Rehabilitation  THERAPY DIAG:  Other low back pain  Other symptoms and signs involving the musculoskeletal system  Chronic pain of right knee  Muscle weakness (generalized)  Other abnormalities of gait and mobility  ONSET DATE: 02/18/24  SUBJECTIVE:  SUBJECTIVE STATEMENT: Patient reports she is able to turn in bed with mild pain; states she took her husband to [facility] yesterday and her back/hip and knee started hurting again, states she feels tired today. Patient states she was sore after last PT visit and but felt better until Wednesday drive. Patient reports 6/10 "soreness" on R LB/hip  and soreness in R knee when straightening knee.   EVAL: Patient reports that she was napping 02/18/24 on her sofa and awoke and stumbled backward hitting her R LB to R hip. She has noted some improvement but is still having pain the back and hip. She has a history R knee pain for the past 5 years. She has had some PT for the R knee but did not continue to exercise. She has gained ~ 20 pounds in the past year.   PERTINENT HISTORY:  Denies medical problems; L knee injury not on R   PAIN:  Are you having pain? Yes: NPRS scale: 6/10 Pain location: R LB to R posterior hip  Pain description: sharp sometimes; dull; aching  Aggravating factors: standing > 5 min;  walking > 3 min; sitting > 15 min Relieving factors: lying on L side; ice   Are you having pain? Yes: NPRS scale: 7/10  Pain location: inside area of the R knee and below the kneecap  Pain description: sharp; achy  Aggravating factors: sleeping; moving on the knee - standing or walking  Relieving factors: ice   PRECAUTIONS: None   WEIGHT BEARING RESTRICTIONS: No  FALLS:  Has patient fallen in last 6 months? Yes. 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  DIAGNOSTIC FINDINGS:  Xray 02/26/24 - results not available   PATIENT SURVEYS:  Modified Oswestry 37/50; 74%    SENSATION: WFL  MUSCLE LENGTH: Hamstrings: Right 55 deg; Left 55 deg Thomas test: tight hip flexors   POSTURE: rounded shoulders, forward head, increased thoracic kyphosis, and flexed trunk   PALPATION: Tender and tight R posterior hip and lumbar paraspinals to lats - transverse abdominals   MOBILITY/TRANSFERS;  Patient with difficulty with all transitional movements sit to stand; stand to sit; sit to supine; rolling to either side; unable to assume prone position   LUMBAR ROM:   AROM eval  Flexion 30% hands on thighs pain R LB  Extension 20% painful   Right lateral flexion 60% discomfort   Left lateral flexion 55% discomfort   Right rotation 10% stiff/tight   Left rotation 10% stiff/tight    (Blank rows = not tested)  LOWER EXTREMITY  ROM:   limited throughout bilat LE's R > L   Active  Right eval Left eval  Hip flexion 100 95  Hip extension Unable to achieve neutral  Unable to achieve neutral  Hip abduction    Hip adduction    Hip internal rotation    Hip external rotation    Knee flexion -22 -20  Knee extension 89 78  Ankle dorsiflexion  Ankle plantarflexion    Ankle inversion    Ankle eversion     (Blank rows = not tested)  LOWER EXTREMITY MMT:  difficult to assess strength - patient mobility limited   MMT Right eval Left eval  Hip flexion 4- 4-  Hip extension    Hip abduction 3+ 3+  Hip adduction    Hip internal rotation    Hip external rotation    Knee flexion 4 4-  Knee extension 4 4-  Ankle dorsiflexion    Ankle plantarflexion    Ankle inversion    Ankle eversion     (Blank rows = not tested)  LUMBAR SPECIAL TESTS:  Straight leg raise test: Negative and Slump test: Negative  FUNCTIONAL TESTS:  5 times sit to stand: unable to assess   GAIT: Distance walked: 40 feet  Assistive device utilized: Single point cane which was too high for patient  Level of assistance: Complete Independence Comments: antalgic gait; trunk, hips, knees flexed; decreased wt bearing R LE with stance to push off   [facility] Adult PT Treatment:                                                DATE: 03/07/2024 Therapeutic Exercise: Supine: LTR x 1 min Wide leg LTR x 1 min Marching  Modified thomas stretch off EOT (Lt) Bridges 2x10 HS stretch w/strap 3x30" Rt heel slides on orange PB Neuromuscular re-ed: Supine:  Quad set with pillow underneath knee 10x5" Small range SLR 10x5" Side Lying: Clamshell + RTB 10x3" Reverse clamshell x10 --> added YTB around ankles x10 Gait: Gait training with SPC --> equal stride length & AD placement    [facility] Adult PT Treatment:                                                DATE: 03/04/24 Therapeutic Exercise: Supine Marching x 10 R/L Trunk rotation x 10 R/L  Quad set knee  resting on pillow 5 sec x 10 R  SRL from quad set 3 sec x 10 R Hamstring stretch hooklying with strap 30 sec x 3 R   Sidelying   Hip abduction x 10 R/L  Clamshell x 10 R/L  Reverse clamshell x 10 R/L  Neuromuscular re-ed: Encouraged upright posture and alignment with core engaged  Therapeutic Activity: Supine Diaphragmatic breathing count of 6 x 10  Bridge ~ 1-2 sec x 10  Shoulder flexion in hooklying x 10 R/L  Sidelying  Working on rolling side to side for exercises and transitional movements/transfers  Sitting Lateral trunk flexion reaching hand over head x 3 each side  Sit to stand from elevated surface with use of UE's x 5  Standing  Self Care: Provided info on TENS unit  PATIENT EDUCATION:  Education details: Added resistance bands to clamshell variations Person educated: Patient Education method: Explanation, Demonstration, Tactile cues, Verbal cues, and Handouts Education comprehension: verbalized understanding, returned demonstration, verbal cues required, tactile cues required, and needs further education  HOME EXERCISE PROGRAM: Access Code: [email or web address] URL: [email or web address] Date: 03/04/2024 Prepared by: Celyn Holt  Exercises - Supine March  - 2 x daily - 7 x weekly - 1-2 sets - 3 reps - 2 sec  hold - Supine Lower Trunk Rotation  - 2 x daily - 7 x weekly - 1 sets - 3-5 reps - 20-30 sec  hold - Sidelying Hip Abduction  - 1 x daily - 7 x weekly - 3 sets - 10 reps - 3-5 sec  hold - Clamshell  - 2 x daily - 7 x weekly - 1 sets - 10 reps - 2-3 sec  hold added RTB - Sidelying Reverse Clamshell  - 2 x daily - 7 x weekly - 1-2 sets - 10 reps - 2-3 sec  hold added YTB - Supine Diaphragmatic Breathing  - 2 x daily - 7 x weekly - 1 sets - 10 reps - 4-6 sec  hold - Supine Quad Set  - 2 x daily - 7 x weekly - 1 sets - 10 reps - 3 sec  hold - Small Range  Straight Leg Raise  - 2 x daily - 7 x weekly - 1 sets - 10 reps - 5 sec  hold - Hooklying Hamstring Stretch with Strap  - 2 x daily - 7 x weekly - 1 sets - 3 reps - 30 sec  hold - Supine Alternating Shoulder Flexion  - 2 x daily - 7 x weekly - 1-2 sets - 10 reps - 2 sec  hold  ASSESSMENT:  CLINICAL IMPRESSION: Adding light resistance around ankles during reverse clamshells improved glute activation. Resistance bands added to clamshell variations on HEP to progress hip strengthening. Gait training continued with focus on equal stride length and safe placement of AD.    EVAL: Patient is a 63 y.o. female who was seen today for physical therapy evaluation and treatment for R sided LBP; chronic R knee pain. Patient reports that she fell back into her coffee tale after a nap 02/18/24 striking her R LB and posterior hip. She has had severe pain in the R back and hip since that time. She reports chronic R knee pain in the past 5 years. She has a history of L knee surgery ? Date and continues to have limited ROM and strength L LE. Patient has forward flexed posture and alignment; difficulty with all transfers and transitional movements; limited trunk and LE mobility/ROM; decreased core and LE strength; abnormal and unsafe gait pattern with cane that was too tall for her(adjusted today and pt was instructed in proper three point gait pattern). Patient has pain limiting all functional activities. She will benefit from PT  to address problems identified.    GOALS: Goals reviewed with patient? Yes  SHORT TERM GOALS: Target date: 03/27/2024  Independent in initial HEP  Baseline: Goal status: INITIAL  2.  Safe gait for home and short distances of 50-100 ft with appropriate assistive device  Baseline:  Goal status: INITIAL  3.  Decrease pain by 25-50% allowing patient to increase normal activities  Baseline:  Goal status: INITIAL    LONG TERM GOALS: Target date: 04/24/2024  Increase trunk ROM to  50-70% with minimal to no pain  Baseline:  Goal status:  INITIAL  2.  Patient reports ability to sit for 30 min and stand for 10 min without increased pain  Baseline:  Goal status: INITIAL  3.  Increased ROM bilt knee extension to (-)10 degrees and flexion to 100 deg R and 90 deg L  Baseline:  Goal status: INITIAL  4.  Increase strength to 4/5 to 4+/5 bialt LE's  Baseline:  Goal status: INITIAL  5.  Independent in HEP including aquatic program as indicated  Baseline:  Goal status: INITIAL  6.  Improve modified Owestry score by 10-15 points  Baseline: 37/50; 74%  Goal status: INITIAL  PLAN:  PT FREQUENCY: 2x/week  PT DURATION: 8 weeks  PLANNED INTERVENTIONS: 97110-Therapeutic exercises, 97530- Therapeutic activity, 97112- Neuromuscular re-education, 97535- Self Care, 91478- Manual therapy, [phone]- Gait training, [phone]- Aquatic Therapy, Patient/Family education, Balance training, Stair training, Taping, Dry Needling, and Joint mobilization.  PLAN FOR NEXT SESSION: review and progress exercises; continue with spine care and ergonomic education; manual work and modalities as indicated    Flint Hummer, PTA 03/07/2024, 2:04 PM

## 2024-03-07 NOTE — Telephone Encounter (Signed)
 Pharmacy Patient Advocate Encounter  Received notification from [facility] that Prior Authorization for [employer]  has been DENIED.  Full denial letter will be uploaded to the media tab. See denial reason below.   PA #/Case ID/Reference #: [id number]

## 2024-03-11 ENCOUNTER — Ambulatory Visit: Admitting: Rehabilitative and Restorative Service Providers"

## 2024-03-11 ENCOUNTER — Ambulatory Visit: Payer: Self-pay | Admitting: *Deleted

## 2024-03-11 ENCOUNTER — Encounter: Payer: Self-pay | Admitting: Rehabilitative and Restorative Service Providers"

## 2024-03-11 DIAGNOSIS — M5459 Other low back pain: Secondary | ICD-10-CM

## 2024-03-11 DIAGNOSIS — R29898 Other symptoms and signs involving the musculoskeletal system: Secondary | ICD-10-CM | POA: Diagnosis not present

## 2024-03-11 DIAGNOSIS — G8929 Other chronic pain: Secondary | ICD-10-CM | POA: Diagnosis not present

## 2024-03-11 DIAGNOSIS — M25561 Pain in right knee: Secondary | ICD-10-CM | POA: Diagnosis not present

## 2024-03-11 DIAGNOSIS — M6281 Muscle weakness (generalized): Secondary | ICD-10-CM | POA: Diagnosis not present

## 2024-03-11 DIAGNOSIS — R2689 Other abnormalities of gait and mobility: Secondary | ICD-10-CM

## 2024-03-11 NOTE — Telephone Encounter (Signed)
  Chief Complaint: severe pain, back/abdominal Symptoms: left sided pain- abdominal pain and falnk/lower back pain Frequency: started Saturday Pertinent Negatives: Patient denies neurologic symptoms Disposition: [x] ED /[] Urgent Care (no appt availability in office) / [] Appointment(In office/virtual)/ []  [place] [facility]/ [] Home Care/ [] Refused Recommended Disposition /[] [facility]/ []  Follow-up with PCP Additional Notes: Due to amount of pain patient has- advised ED   Copied from CRM [id number]. Topic: Clinical - Red Word Triage >> Mar 11, 2024  8:12 AM Arlie Benedict B wrote: [facility] that prompted transfer to Nurse Triage: severe pain in the lower left back, experienced a fall a few weeks ago unsure if the pain is due to the fall or if its UTI related. Patient is not having a problem using the restroom but pain in severe in the back Reason for Disposition  [1] SEVERE back pain (e.g., excruciating) AND [2] sudden onset AND [3] age > 60 years  Answer Assessment - Initial Assessment Questions 1. ONSET: "When did the pain begin?"      Patient fell 4/13-ED- disc shrinking on x-ray- fell on R side- Saturday stomach started hurting -left and back began to hurt  2. LOCATION: "Where does it hurt?" (upper, mid or lower back)     Lower back pain- unable to do PT 3. SEVERITY: "How bad is the pain?"  (e.g., Scale 1-10; mild, moderate, or severe)   - MILD (1-3): Doesn't interfere with normal activities.    - MODERATE (4-7): Interferes with normal activities or awakens from sleep.    - SEVERE (8-10): Excruciating pain, unable to do any normal activities.      severe 4. PATTERN: "Is the pain constant?" (e.g., yes, no; constant, intermittent)      constant 5. RADIATION: "Does the pain shoot into your legs or somewhere else?"     Abdomen tender 6. CAUSE:  "What do you think is causing the back pain?"      unsure 7. BACK OVERUSE:  "Any recent lifting of heavy objects, strenuous work or  exercise?"     Recent injury 8. MEDICINES: "What have you taken so far for the pain?" (e.g., nothing, acetaminophen, NSAIDS)     Tylenol-extra strength yesterday, heat 9. NEUROLOGIC SYMPTOMS: "Do you have any weakness, numbness, or problems with bowel/bladder control?"     no 10. OTHER SYMPTOMS: "Do you have any other symptoms?" (e.g., fever, abdomen pain, burning with urination, blood in urine)       Abdominal pain, flank pain  Protocols used: Back Pain-A-AH

## 2024-03-11 NOTE — Therapy (Signed)
 OUTPATIENT PHYSICAL THERAPY THORACOLUMBAR TREATMENT   Patient Name: Carol Franklin MRN: [id number] DOB:Aug 21, 1961, 63 y.o., female Today's Date: 03/11/2024  END OF SESSION:  PT End of Session - 03/11/24 1113     Visit Number 4    Authorization Type [employer] fereral plan copay $[id number]    Authorization Time Period year - 50 visits per year - 37remaining    Authorization - Visit Number 4    Authorization - Number of Visits 37    PT Start Time 1107    PT Stop Time 1145    PT Time Calculation (min) 38 min    Activity Tolerance Patient tolerated treatment well            Past Medical History:  Diagnosis Date   Abnormal EKG    BMI 35.0-35.9,adult 01/06/2012   Chronic pain of both knees 01/13/2020   Chronic right-sided low back pain with right-sided sciatica 10/21/2020   Eczema 02/14/2020   Essential thrombocytosis (HCC) 01/13/2012   Fatigue 10/21/2020   Hyperlipidemia 01/13/2012   Hypertension    Intertrigo    Loud snoring 10/21/2020   Menopausal hot flushes 09/17/2014   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/2012   Osteopenia 12/04/2015   Person encountering health services to consult on behalf of another person 03/03/2023   Post-operative nausea and vomiting    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   Weight gain 02/14/2020   Past Surgical History:  Procedure Laterality Date   CESAREAN SECTION     X2   DILATION AND CURETTAGE OF UTERUS     KNEE SURGERY     left   Patient Active Problem List   Diagnosis Date Noted   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   Post-operative nausea and vomiting    Intertrigo    Hypertension    Abnormal EKG    Person encountering health services to consult on behalf of another person 03/03/2023   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   Fatigue 10/21/2020   Chronic right-sided low back pain with right-sided sciatica 10/21/2020   Loud snoring 10/21/2020   Eczema 02/14/2020   Weight gain 02/14/2020   Chronic pain of both knees 01/13/2020    Osteopenia 12/04/2015   Menopausal hot flushes 09/17/2014   Essential thrombocytosis (HCC) 01/13/2012   Hyperlipidemia 01/13/2012   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/2012   BMI 35.0-35.9,adult 01/06/2012    PCP: Cherre [facility], NP  REFERRING PROVIDER: Cherre [facility]ish, NP  REFERRING DIAG: R sided LBP; chronic R knee   Rationale for Evaluation and Treatment: Rehabilitation  THERAPY DIAG:  Other low back pain  Other symptoms and signs involving the musculoskeletal system  Chronic pain of right knee  Muscle weakness (generalized)  Other abnormalities of gait and mobility  ONSET DATE: 02/18/24  SUBJECTIVE:  SUBJECTIVE STATEMENT: Patient reports she was making progress and feeling better the end of the week but began to have increased pain in the L LB a little on Saturday but more so yesterday afternoon and evening. Pain has been so much worse in the L LB area since then. She has less pain on R. She has not felt like doing any exercises. Was more active on Friday but not doing anything that seemed to irritate the back at the time. Upon discussion patient remembers lying n her sofa with her legs up Saturday evening which may have irritated the L LB. She also states that she worked ~ 4 hours a day Monday- Thursday. Works on her laptop at [employer] table.   EVAL: Patient reports that she was napping 02/18/24 on her sofa and awoke and stumbled backward hitting her R LB to R hip. She has noted some improvement but is still having pain the back and hip. She has a history R knee pain for the past 5 years. She has had some PT for the R knee but did not continue to exercise. She has gained ~ 20 pounds in the past year.   PERTINENT HISTORY:  Denies medical problems; L knee injury not on R   PAIN:  Are you having pain? Yes:  NPRS scale: 10/10 Pain location: R LB to R posterior hip; now pain is on the L LB area   Pain description: sharp sometimes; dull; aching  Aggravating factors: standing > 5 min; walking > 3 min; sitting > 15 min Relieving factors: lying on L side; ice   Are you having pain? Yes: NPRS scale: 7/10  Pain location: inside area of the R knee and below the kneecap  Pain description: sharp; achy  Aggravating factors: sleeping; moving on the knee - standing or walking  Relieving factors: ice   PRECAUTIONS: None   WEIGHT BEARING RESTRICTIONS: No  FALLS:  Has patient fallen in last 6 months? Yes. 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  DIAGNOSTIC FINDINGS:  Xray 02/26/24 - results not available   PATIENT SURVEYS:  Modified Oswestry 37/50; 74%    SENSATION: WFL  MUSCLE LENGTH: Hamstrings: Right 55 deg; Left 55 deg Thomas test: tight hip flexors   POSTURE: rounded shoulders, forward head, increased thoracic kyphosis, and flexed trunk   PALPATION: Tender and tight R posterior hip and lumbar paraspinals to lats - transverse abdominals   MOBILITY/TRANSFERS;  Patient with difficulty with all transitional movements sit to stand; stand to sit; sit to supine; rolling to either side; unable to assume prone position   LUMBAR ROM:   AROM eval  Flexion 30% hands on thighs pain R LB  Extension 20% painful   Right  lateral flexion 60% discomfort   Left lateral flexion 55% discomfort   Right rotation 10% stiff/tight   Left rotation 10% stiff/tight    (Blank rows = not tested)  LOWER EXTREMITY ROM:   limited  throughout bilat LE's R > L   Active  Right eval Left eval  Hip flexion 100 95  Hip extension Unable to achieve neutral  Unable to achieve neutral  Hip abduction    Hip adduction    Hip internal rotation    Hip external rotation    Knee flexion -22 -20  Knee extension 89 78  Ankle dorsiflexion    Ankle plantarflexion    Ankle inversion    Ankle eversion     (Blank rows = not tested)  LOWER EXTREMITY MMT:  difficult to assess strength - patient mobility limited   MMT Right eval Left eval  Hip flexion 4- 4-  Hip extension    Hip abduction 3+ 3+  Hip adduction    Hip internal rotation    Hip external rotation    Knee flexion 4 4-  Knee extension 4 4-  Ankle dorsiflexion    Ankle plantarflexion    Ankle inversion    Ankle eversion     (Blank rows = not tested)  LUMBAR SPECIAL TESTS:  Straight leg raise test: Negative and Slump test: Negative  FUNCTIONAL TESTS:  5 times sit to stand: unable to assess   GAIT: Distance walked: 40 feet  Assistive device utilized: Single point cane which was too high for patient  Level of assistance: Complete Independence Comments: antalgic gait; trunk, hips, knees flexed; decreased wt bearing R LE with stance to push off   [facility] Adult PT Treatment:                                                DATE: 03/11/2024 Manual:   STM L lumbar paraspinals ; lats; QL patient R sidelying pillow between knees   Myofacial release L lumbar musculature  Distal release hip to disassociate hip and ribs    Therapeutic Exercise: Sidelying  Depression L knee  Supine: SKTC x 10 R/L  LTR x 1 min Wide leg LTR x 1 min Marching  Sitting  Gentle side bend to R 15 sec x 4  Neuromuscular re-ed: Supine:  Quad set with pillow underneath knee 10x5" Small  range SLR 10x5" Side Lying: Clamshell + RTB 10x3" Reverse clamshell x10 --> added YTB around ankles x10 Gait: Gait training with SPC --> equal stride length & AD placement   [facility] Adult PT Treatment:                                                DATE: 03/07/2024 Therapeutic Exercise: Supine: LTR x 1 min Wide leg LTR x 1 min Marching  Neuromuscular re-ed: Supine:  Diaphragmatic breathing with relaxation Side Lying: Hip protraction/retraction; distal and proximal ROM/movement to improve muscular balance and movement Gait: Standing with improve posture and alignment  Modalities: Trial of TENS unit L lumbar to posterior hip; intensity adjusted to patient tolerance x 15 min Moist heat with TENS      [facility] Adult PT Treatment:                                                DATE: 03/04/24 Therapeutic  Exercise: Supine Marching x 10 R/L Trunk rotation x 10 R/L  Quad set knee resting on pillow 5 sec x 10 R  SRL from quad set 3 sec x 10 R Hamstring stretch hooklying with strap 30 sec x 3 R   Sidelying   Hip abduction x 10 R/L  Clamshell x 10 R/L  Reverse clamshell x 10 R/L  Neuromuscular re-ed: Encouraged upright posture and alignment with core engaged  Therapeutic Activity: Supine Diaphragmatic breathing count of 6 x 10  Bridge ~ 1-2 sec x 10  Shoulder flexion in hooklying x 10 R/L  Sidelying  Working on rolling side to side for exercises and transitional movements/transfers  Sitting Lateral trunk flexion reaching hand over head x 3 each side  Sit to stand from elevated surface with use of UE's x 5  Standing  Self Care: Provided info on TENS unit                                                                                                       PATIENT EDUCATION:  Education details: Added resistance bands to clamshell variations Person educated: Patient Education method: Explanation, Demonstration, Tactile cues, Verbal cues, and Handouts Education comprehension:  verbalized understanding, returned demonstration, verbal cues required, tactile cues required, and needs further education  HOME EXERCISE PROGRAM: Access Code: [email or web address] URL: [email or web address] Date: 03/04/2024 Prepared by: Deonne Rooks  Exercises - Supine March  - 2 x daily - 7 x weekly - 1-2 sets - 3 reps - 2 sec  hold - Supine Lower Trunk Rotation  - 2 x daily - 7 x weekly - 1 sets - 3-5 reps - 20-30 sec  hold - Sidelying Hip Abduction  - 1 x daily - 7 x weekly - 3 sets - 10 reps - 3-5 sec  hold - Clamshell  - 2 x daily - 7 x weekly - 1 sets - 10 reps - 2-3 sec  hold added RTB - Sidelying Reverse Clamshell  - 2 x daily - 7 x weekly - 1-2 sets - 10 reps - 2-3 sec  hold added YTB - Supine Diaphragmatic Breathing  - 2 x daily - 7 x weekly - 1 sets - 10 reps - 4-6 sec  hold - Supine Quad Set  - 2 x daily - 7 x weekly - 1 sets - 10 reps - 3 sec  hold - Small Range Straight Leg Raise  - 2 x daily - 7 x weekly - 1 sets - 10 reps - 5 sec  hold - Hooklying Hamstring Stretch with Strap  - 2 x daily - 7 x weekly - 1 sets - 3 reps - 30 sec  hold - Supine Alternating Shoulder Flexion  - 2 x daily - 7 x weekly - 1-2 sets - 10 reps - 2 sec  hold  ASSESSMENT:  CLINICAL IMPRESSION: Significant flare up of pain - with pain reported in the L LB instead of the R. Upon questioning and discussion, patient states that she has increased activity level and did remember sitting on a soft sofa  for some time Saturday evening before symptoms increased on Sunday. Patient has palpable tightness through the L lumbar area in lats, lumbar paraspinals, lats. Responded well to manual work, gentle exercise and trial of TENS unit to address acute flare up of pain. Will continue assessment and progress with exercises as patient tolerates.   EVAL: Patient is a 63 y.o. female who was seen today for physical therapy evaluation and treatment for R sided LBP; chronic R knee pain. Patient reports that she fell back  into her coffee tale after a nap 02/18/24 striking her R LB and posterior hip. She has had severe pain in the R back and hip since that time. She reports chronic R knee pain in the past 5 years. She has a history of L knee surgery ? Date and continues to have limited ROM and strength L LE. Patient has forward flexed posture and alignment; difficulty with all transfers and transitional movements; limited trunk and LE mobility/ROM; decreased core and LE strength; abnormal and unsafe gait pattern with cane that was too tall for her(adjusted today and pt was instructed in proper three point gait pattern). Patient has pain limiting all functional activities. She will benefit from PT  to address problems identified.    GOALS: Goals reviewed with patient? Yes  SHORT TERM GOALS: Target date: 03/27/2024  Independent in initial HEP  Baseline: Goal status: INITIAL  2.  Safe gait for home and short distances of 50-100 ft with appropriate assistive device  Baseline:  Goal status: INITIAL  3.  Decrease pain by 25-50% allowing patient to increase normal activities  Baseline:  Goal status: INITIAL    LONG TERM GOALS: Target date: 04/24/2024  Increase trunk ROM to 50-70% with minimal to no pain  Baseline:  Goal status: INITIAL  2.  Patient reports ability to sit for 30 min and stand for 10 min without increased pain  Baseline:  Goal status: INITIAL  3.  Increased ROM bilt knee extension to (-)10 degrees and flexion to 100 deg R and 90 deg L  Baseline:  Goal status: INITIAL  4.  Increase strength to 4/5 to 4+/5 bialt LE's  Baseline:  Goal status: INITIAL  5.  Independent in HEP including aquatic program as indicated  Baseline:  Goal status: INITIAL  6.  Improve modified Owestry score by 10-15 points  Baseline: 37/50; 74%  Goal status: INITIAL  PLAN:  PT FREQUENCY: 2x/week  PT DURATION: 8 weeks  PLANNED INTERVENTIONS: 97110-Therapeutic exercises, 97530- Therapeutic activity, 97112-  Neuromuscular re-education, 97535- Self Care, [id number]- Manual therapy, [phone]- Gait training, [phone]- Aquatic Therapy, Patient/Family education, Balance training, Stair training, Taping, Dry Needling, and Joint mobilization.  PLAN FOR NEXT SESSION: review and progress exercises; continue with spine care and ergonomic education; manual work and modalities as indicated    Anabel Lykins P Raymona Boss, PT 03/11/2024, 11:14 AM

## 2024-03-12 DIAGNOSIS — F329 Major depressive disorder, single episode, unspecified: Secondary | ICD-10-CM | POA: Diagnosis not present

## 2024-03-14 ENCOUNTER — Ambulatory Visit

## 2024-03-14 DIAGNOSIS — R29898 Other symptoms and signs involving the musculoskeletal system: Secondary | ICD-10-CM | POA: Diagnosis not present

## 2024-03-14 DIAGNOSIS — M5459 Other low back pain: Secondary | ICD-10-CM | POA: Diagnosis not present

## 2024-03-14 DIAGNOSIS — G8929 Other chronic pain: Secondary | ICD-10-CM

## 2024-03-14 DIAGNOSIS — M6281 Muscle weakness (generalized): Secondary | ICD-10-CM | POA: Diagnosis not present

## 2024-03-14 DIAGNOSIS — R2689 Other abnormalities of gait and mobility: Secondary | ICD-10-CM | POA: Diagnosis not present

## 2024-03-14 DIAGNOSIS — M25561 Pain in right knee: Secondary | ICD-10-CM | POA: Diagnosis not present

## 2024-03-14 NOTE — Therapy (Signed)
 OUTPATIENT PHYSICAL THERAPY THORACOLUMBAR TREATMENT   Patient Name: BRIGHTYN ZERR MRN: [id number] DOB:[birth year], 63 y.o., female Today's Date: 03/14/2024  END OF SESSION:  PT End of Session - 03/14/24 1106     Visit Number 5    Number of Visits 16    Date for PT Re-Evaluation 04/24/24    Authorization Type [employer] fereral plan copay $35    Authorization Time Period year - 50 visits per year - 37remaining    Authorization - Visit Number 5    Authorization - Number of Visits 37    PT Start Time 1105    PT Stop Time 1153    PT Time Calculation (min) 48 min    Activity Tolerance Patient tolerated treatment well    Behavior During Therapy WFL for tasks assessed/performed            Past Medical History:  Diagnosis Date   Abnormal EKG    BMI 35.0-35.9,adult 01/06/2012   Chronic pain of both knees 01/13/2020   Chronic right-sided low back pain with right-sided sciatica 10/21/2020   Eczema 02/14/2020   Essential thrombocytosis (HCC) 01/13/2012   Fatigue 10/21/2020   Hyperlipidemia 01/13/2012   Hypertension    Intertrigo    Loud snoring 10/21/2020   Menopausal hot flushes 09/17/2014   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/2012   Osteopenia 12/04/2015   Person encountering health services to consult on behalf of another person 03/03/2023   Post-operative nausea and vomiting    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   Weight gain 02/14/2020   Past Surgical History:  Procedure Laterality Date   CESAREAN SECTION     X2   DILATION AND CURETTAGE OF UTERUS     KNEE SURGERY     left   Patient Active Problem List   Diagnosis Date Noted   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   Post-operative nausea and vomiting    Intertrigo    Hypertension    Abnormal EKG    Person encountering health services to consult on behalf of another person 03/03/2023   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   Fatigue 10/21/2020   Chronic right-sided low back pain with right-sided sciatica 10/21/2020    Loud snoring 10/21/2020   Eczema 02/14/2020   Weight gain 02/14/2020   Chronic pain of both knees 01/13/2020   Osteopenia 12/04/2015   Menopausal hot flushes 09/17/2014   Essential thrombocytosis (HCC) 01/13/2012   Hyperlipidemia 01/13/2012   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/2012   BMI 35.0-35.9,adult 01/06/2012    PCP: Cherre Cornish, NP  REFERRING PROVIDER: Cherre Cornish, NP  REFERRING DIAG: R sided LBP; chronic R knee   Rationale for Evaluation and Treatment: Rehabilitation  THERAPY DIAG:  Other low back pain  Other symptoms and signs involving the musculoskeletal system  Chronic pain of right knee  Muscle weakness (generalized)  Other abnormalities of gait and mobility  ONSET DATE: 02/18/24  SUBJECTIVE:  SUBJECTIVE STATEMENT: Patient reports her R knee is hurting today, states she has "pain with the popping and cracking"   EVAL: Patient reports that she was napping 02/18/24 on her sofa and awoke and stumbled backward hitting her R LB to R hip. She has noted some improvement but is still having pain the back and hip. She has a history R knee pain for the past 5 years. She has had some PT for the R knee but did not continue to exercise. She has gained ~ 20 pounds in the past year.   PERTINENT HISTORY:  Denies medical problems; L knee injury not on R   PAIN:  Are you having pain? Yes: NPRS scale: 10/10 Pain location: R LB to R posterior hip; now pain is on the L LB area   Pain description: sharp sometimes; dull; aching  Aggravating factors: standing > 5 min; walking > 3 min; sitting > 15 min Relieving factors: lying on L side; ice   Are you having pain? Yes: NPRS scale: 7/10  Pain location: inside area of the R knee and below the kneecap  Pain description: sharp; achy  Aggravating factors:  sleeping; moving on the knee - standing or walking  Relieving factors: ice   PRECAUTIONS: None   WEIGHT BEARING RESTRICTIONS: No  FALLS:  Has patient fallen in last 6 months? Yes. 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  DIAGNOSTIC FINDINGS:  Xray 02/26/24 - results not available   PATIENT SURVEYS:  Modified Oswestry 37/50; 74%    SENSATION: WFL  MUSCLE LENGTH: Hamstrings: Right 55 deg; Left 55 deg Thomas test: tight hip flexors   POSTURE: rounded shoulders, forward head, increased thoracic kyphosis, and flexed trunk   PALPATION: Tender and tight R posterior hip and lumbar paraspinals to lats - transverse abdominals   MOBILITY/TRANSFERS;  Patient with difficulty with all transitional movements sit to stand; stand to sit; sit to supine; rolling to either side; unable to assume prone position   LUMBAR ROM:   AROM eval  Flexion 30% hands on thighs pain R LB  Extension 20% painful   Right lateral flexion 60% discomfort   Left lateral flexion 55% discomfort   Right rotation 10% stiff/tight   Left rotation 10% stiff/tight    (Blank rows = not tested)  LOWER EXTREMITY ROM:   limited throughout bilat LE's R > L   Active  Right eval Left eval  Hip flexion 100 95  Hip extension Unable to achieve neutral  Unable to achieve neutral  Hip abduction    Hip adduction    Hip internal rotation    Hip  external rotation    Knee flexion -22 -20  Knee extension 89 78  Ankle dorsiflexion    Ankle plantarflexion    Ankle inversion    Ankle eversion     (Blank rows = not tested)  LOWER EXTREMITY MMT:  difficult to assess strength - patient mobility limited   MMT  Right eval Left eval  Hip flexion 4- 4-  Hip extension    Hip abduction 3+ 3+  Hip adduction    Hip internal rotation    Hip external rotation    Knee flexion 4 4-  Knee extension 4 4-  Ankle dorsiflexion    Ankle plantarflexion    Ankle inversion    Ankle eversion     (Blank rows = not tested)  LUMBAR SPECIAL TESTS:  Straight leg raise test: Negative and Slump test: Negative  FUNCTIONAL TESTS:  5 times sit to stand: unable to assess   GAIT: Distance walked: 40 feet  Assistive device utilized: Single point cane which was too high for patient  Level of assistance: Complete Independence Comments: antalgic gait; trunk, hips, knees flexed; decreased wt bearing R LE with stance to push off   [facility] Adult PT Treatment:                                                DATE: 03/14/2024 Therapeutic Exercise: Supine: LTR x 1 min Alternating SKTC --> leg stretch down  Heel slides with orange PB R hip flexor/quad stretch off edge of table  Standing lumbar extension stretch Manual Therapy: IASTM medial gastroc, distal adductors, anteriolateral quads Neuromuscular re-ed: Small range --> parallel & ER 10x5" each Supine bridges Resisted lateral step out/in + YTB crossed at ankles --> slow step in for control Modalities: Moist heat lumbar x 10 min    [facility] Adult PT Treatment:                                                DATE: 03/11/2024 Manual:   STM L lumbar paraspinals ; lats; QL patient R sidelying pillow between knees   Myofacial release L lumbar musculature  Distal release hip to disassociate hip and ribs    Therapeutic Exercise: Sidelying  Depression L knee  Supine: SKTC x 10 R/L  LTR x 1 min Wide leg LTR x  1 min Marching  Sitting  Gentle side bend to R 15 sec x 4  Neuromuscular re-ed: Supine:  Quad set with pillow underneath knee 10x5" Small range SLR 10x5" Side Lying: Clamshell + RTB 10x3" Reverse clamshell x10 --> added YTB around ankles x10 Gait: Gait training with SPC --> equal stride length & AD placement   [facility] Adult PT Treatment:                                                DATE: 03/07/2024 Therapeutic Exercise: Supine: LTR x 1 min Wide leg LTR x 1 min Marching  Neuromuscular re-ed: Supine:  Diaphragmatic breathing with relaxation Side Lying: Hip protraction/retraction; distal and proximal ROM/movement to improve muscular balance and movement Gait: Standing with improve posture and alignment  Modalities: Trial of TENS unit L lumbar to posterior hip; intensity adjusted to patient tolerance x 15 min Moist heat with TENS  PATIENT EDUCATION:  Education details: Added resistance bands to clamshell variations Person educated: Patient Education method: Explanation, Demonstration, Tactile cues, Verbal cues, and Handouts Education comprehension: verbalized understanding, returned demonstration, verbal cues required, tactile cues required, and needs further education  HOME EXERCISE PROGRAM: Access Code: [email or web address] URL: [email or web address] Date: 03/04/2024 Prepared by: Celyn Holt  Exercises - Supine March  - 2 x daily - 7 x weekly - 1-2 sets - 3 reps - 2 sec  hold - Supine Lower Trunk Rotation  - 2 x daily - 7 x weekly - 1 sets - 3-5 reps - 20-30 sec  hold - Sidelying Hip Abduction  - 1 x daily - 7 x weekly - 3 sets - 10 reps - 3-5 sec  hold - Clamshell  - 2 x daily - 7 x weekly - 1 sets - 10 reps - 2-3 sec  hold added RTB - Sidelying Reverse Clamshell  - 2 x daily - 7 x weekly - 1-2 sets - 10 reps - 2-3 sec  hold added YTB - Supine Diaphragmatic Breathing   - 2 x daily - 7 x weekly - 1 sets - 10 reps - 4-6 sec  hold - Supine Quad Set  - 2 x daily - 7 x weekly - 1 sets - 10 reps - 3 sec  hold - Small Range Straight Leg Raise  - 2 x daily - 7 x weekly - 1 sets - 10 reps - 5 sec  hold - Hooklying Hamstring Stretch with Strap  - 2 x daily - 7 x weekly - 1 sets - 3 reps - 30 sec  hold - Supine Alternating Shoulder Flexion  - 2 x daily - 7 x weekly - 1-2 sets - 10 reps - 2 sec  hold  ASSESSMENT:  CLINICAL IMPRESSION: Exercises continued focusing on lumbar mobility and hip/glute strengthening. Myofascial tension and tightness noted along medial gastroc and distal adductors. Standing spinal extension stretch alleviated lumbar discomfort.   EVAL: Patient is a 63 y.o. female who was seen today for physical therapy evaluation and treatment for R sided LBP; chronic R knee pain. Patient reports that she fell back into her coffee tale after a nap 02/18/24 striking her R LB and posterior hip. She has had severe pain in the R back and hip since that time. She reports chronic R knee pain in the past 5 years. She has a history of L knee surgery ? Date and continues to have limited ROM and strength L LE. Patient has forward flexed posture and alignment; difficulty with all transfers and transitional movements; limited trunk and LE mobility/ROM; decreased core and LE strength; abnormal and unsafe gait pattern with cane that was too tall for her(adjusted today and pt was instructed in proper three point gait pattern). Patient has pain limiting all functional activities. She will benefit from PT  to address problems identified.    GOALS: Goals reviewed with patient? Yes  SHORT TERM GOALS: Target date: 03/27/2024  Independent in initial HEP  Baseline: Goal status: INITIAL  2.  Safe gait for home and short distances of 50-100 ft with appropriate assistive device  Baseline:  Goal status: INITIAL  3.  Decrease pain by 25-50% allowing patient to increase normal  activities  Baseline:  Goal status: INITIAL    LONG TERM GOALS: Target date: 04/24/2024  Increase trunk ROM to 50-70% with minimal to no pain  Baseline:  Goal status: INITIAL  2.  Patient reports ability to sit for 30  min and stand for 10 min without increased pain  Baseline:  Goal status: INITIAL  3.  Increased ROM bilt knee extension to (-)10 degrees and flexion to 100 deg R and 90 deg L  Baseline:  Goal status: INITIAL  4.  Increase strength to 4/5 to 4+/5 bialt LE's  Baseline:  Goal status: INITIAL  5.  Independent in HEP including aquatic program as indicated  Baseline:  Goal status: INITIAL  6.  Improve modified Owestry score by 10-15 points  Baseline: 37/50; 74%  Goal status: INITIAL  PLAN:  PT FREQUENCY: 2x/week  PT DURATION: 8 weeks  PLANNED INTERVENTIONS: 97110-Therapeutic exercises, 97530- Therapeutic activity, 97112- Neuromuscular re-education, 97535- Self Care, 16109- Manual therapy, [phone]- Gait training, [phone]- Aquatic Therapy, Patient/Family education, Balance training, Stair training, Taping, Dry Needling, and Joint mobilization.  PLAN FOR NEXT SESSION: review and progress exercises; continue with spine care and ergonomic education; manual work and modalities as indicated    Flint Hummer, PTA 03/14/2024, 11:46 AM

## 2024-03-18 ENCOUNTER — Ambulatory Visit (INDEPENDENT_AMBULATORY_CARE_PROVIDER_SITE_OTHER)

## 2024-03-18 DIAGNOSIS — R29898 Other symptoms and signs involving the musculoskeletal system: Secondary | ICD-10-CM

## 2024-03-18 DIAGNOSIS — M5459 Other low back pain: Secondary | ICD-10-CM

## 2024-03-18 DIAGNOSIS — M6281 Muscle weakness (generalized): Secondary | ICD-10-CM | POA: Diagnosis not present

## 2024-03-18 DIAGNOSIS — R2689 Other abnormalities of gait and mobility: Secondary | ICD-10-CM

## 2024-03-18 DIAGNOSIS — G8929 Other chronic pain: Secondary | ICD-10-CM | POA: Diagnosis not present

## 2024-03-18 DIAGNOSIS — M25561 Pain in right knee: Secondary | ICD-10-CM | POA: Diagnosis not present

## 2024-03-18 NOTE — Therapy (Signed)
 OUTPATIENT PHYSICAL THERAPY THORACOLUMBAR TREATMENT   Patient Name: Carol Franklin MRN: [id number] DOB:August 03, 1961, 63 y.o., female Today's Date: 03/18/2024  END OF SESSION:  PT End of Session - 03/18/24 1109     Visit Number 6    Number of Visits 16    Date for PT Re-Evaluation 04/24/24    Authorization Type [employer] fereral plan copay $35    Authorization Time Period year - 50 visits per year - 37remaining    Authorization - Visit Number 6    Authorization - Number of Visits 37    PT Start Time 1108   pt arrived late   PT Stop Time 1148    PT Time Calculation (min) 40 min    Activity Tolerance Patient tolerated treatment well    Behavior During Therapy WFL for tasks assessed/performed            Past Medical History:  Diagnosis Date   Abnormal EKG    BMI 35.0-35.9,adult 01/06/2012   Chronic pain of both knees 01/13/2020   Chronic right-sided low back pain with right-sided sciatica 10/21/2020   Eczema 02/14/2020   Essential thrombocytosis (HCC) 01/13/2012   Fatigue 10/21/2020   Hyperlipidemia 01/13/2012   Hypertension    Intertrigo    Loud snoring 10/21/2020   Menopausal hot flushes 09/17/2014   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/2012   Osteopenia 12/04/2015   Person encountering health services to consult on behalf of another person 03/03/2023   Post-operative nausea and vomiting    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   Weight gain 02/14/2020   Past Surgical History:  Procedure Laterality Date   CESAREAN SECTION     X2   DILATION AND CURETTAGE OF UTERUS     KNEE SURGERY     left   Patient Active Problem List   Diagnosis Date Noted   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   Post-operative nausea and vomiting    Intertrigo    Hypertension    Abnormal EKG    Person encountering health services to consult on behalf of another person 03/03/2023   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   Fatigue 10/21/2020   Chronic right-sided low back pain with right-sided  sciatica 10/21/2020   Loud snoring 10/21/2020   Eczema 02/14/2020   Weight gain 02/14/2020   Chronic pain of both knees 01/13/2020   Osteopenia 12/04/2015   Menopausal hot flushes 09/17/2014   Essential thrombocytosis (HCC) 01/13/2012   Hyperlipidemia 01/13/2012   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/2012   BMI 35.0-35.9,adult 01/06/2012    PCP: Cherre Cornish, NP  REFERRING PROVIDER: Cherre Cornish, NP  REFERRING DIAG: R sided LBP; chronic R knee   Rationale for Evaluation and Treatment: Rehabilitation  THERAPY DIAG:  Other low back pain  Other symptoms and signs involving the musculoskeletal system  Chronic pain of right knee  Muscle weakness (generalized)  Other abnormalities of gait and mobility  ONSET DATE: 02/18/24  SUBJECTIVE:  SUBJECTIVE STATEMENT: Patient reports 10/10 pain in Lt knee; patient states it feels like her knee is "crunkling apart". Patient states her back is feeling much better.  EVAL: Patient reports that she was napping 02/18/24 on her sofa and awoke and stumbled backward hitting her R LB to R hip. She has noted some improvement but is still having pain the back and hip. She has a history R knee pain for the past 5 years. She has had some PT for the R knee but did not continue to exercise. She has gained ~ 20 pounds in the past year.   PERTINENT HISTORY:  Denies medical problems; L knee injury not on R   PAIN:  Are you having pain? Yes: NPRS scale: 10/10 Pain location: R LB to R posterior hip; now pain is on the L LB area   Pain description: sharp sometimes; dull; aching  Aggravating factors: standing > 5 min; walking > 3 min; sitting > 15 min Relieving factors: lying on L side; ice   Are you having pain? Yes: NPRS scale: 7/10  Pain location: inside area of the R knee and below the  kneecap  Pain description: sharp; achy  Aggravating factors: sleeping; moving on the knee - standing or walking  Relieving factors: ice   PRECAUTIONS: None  WEIGHT BEARING RESTRICTIONS: No  FALLS:  Has patient fallen in last 6 months? Yes. 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  DIAGNOSTIC FINDINGS:  Xray 02/26/24 - results not available   PATIENT SURVEYS:  Modified Oswestry 37/50; 74%   SENSATION: WFL  MUSCLE LENGTH: Hamstrings: Right 55 deg; Left 55 deg Thomas test: tight hip flexors   POSTURE: rounded shoulders, forward head, increased thoracic kyphosis, and flexed trunk   PALPATION: Tender and tight R posterior hip and lumbar paraspinals to lats - transverse abdominals   MOBILITY/TRANSFERS;  Patient with difficulty with all transitional movements sit to stand; stand to sit; sit to supine; rolling to either side; unable to assume prone position   LUMBAR ROM:   AROM eval  Flexion 30% hands on thighs pain R LB  Extension 20% painful   Right lateral flexion 60% discomfort   Left lateral flexion 55% discomfort   Right rotation 10% stiff/tight   Left rotation 10% stiff/tight    (Blank rows = not tested)  LOWER EXTREMITY ROM:   limited throughout bilat LE's R > L   Active  Right eval Left eval  Hip flexion 100 95  Hip extension Unable to achieve neutral  Unable to achieve neutral  Hip abduction     Hip adduction    Hip internal rotation    Hip external rotation    Knee flexion -22 -20  Knee extension 89 78  Ankle dorsiflexion    Ankle plantarflexion    Ankle inversion    Ankle eversion     (Blank rows = not tested)  LOWER EXTREMITY MMT:  difficult to assess strength - patient  mobility limited   MMT Right eval Left eval  Hip flexion 4- 4-  Hip extension    Hip abduction 3+ 3+  Hip adduction    Hip internal rotation    Hip external rotation    Knee flexion 4 4-  Knee extension 4 4-  Ankle dorsiflexion    Ankle plantarflexion    Ankle inversion    Ankle eversion     (Blank rows = not tested)  LUMBAR SPECIAL TESTS:  Straight leg raise test: Negative and Slump test: Negative  FUNCTIONAL TESTS:  5 times sit to stand: unable to assess   GAIT: Distance walked: 40 feet  Assistive device utilized: Single point cane which was too high for patient  Level of assistance: Complete Independence Comments: antalgic gait; trunk, hips, knees flexed; decreased wt bearing R LE with stance to push off   [facility] Adult PT Treatment:                                                DATE: 03/18/2024 Therapeutic Exercise: NuStep L3 x 5 min Heel slides with orange PB Hooklying clamshell with black TB x10  Neuromuscular re-ed: Hooklying hip add & abd isometric 10x5" each Bridges + hip abd iso with gait belt around thighs 2x10 Small range SLR 10x5" SAQ (green bolster) --> discontinued d/t pain Quad set --> small range SLR Therapeutic Activity: Walking 274ft + SPC --> adjusted SPC for proper fit Standing mini squats   [facility] Adult PT Treatment:                                                DATE: 03/14/2024 Therapeutic Exercise: Supine: LTR x 1 min Alternating SKTC --> leg stretch down  Heel slides with orange PB R hip flexor/quad stretch off edge of table  Standing lumbar extension stretch Manual Therapy: IASTM medial gastroc, distal adductors, anteriolateral  quads Neuromuscular re-ed: Small range --> parallel & ER 10x5" each Supine bridges Resisted lateral step out/in + YTB crossed at ankles --> slow step in for control Modalities: Moist heat lumbar x 10 min    [facility] Adult PT Treatment:                                                DATE: 03/11/2024 Manual:   STM L lumbar paraspinals ; lats; QL patient R sidelying pillow between knees   Myofacial release L lumbar musculature  Distal release hip to disassociate hip and ribs    Therapeutic Exercise: Sidelying  Depression L knee  Supine: SKTC x 10 R/L  LTR x 1 min Wide leg LTR x 1 min Marching  Sitting  Gentle side bend to R 15 sec x 4 Neuromuscular re-ed: Supine:  Quad set with pillow underneath knee 10x5" Small range SLR 10x5" Side Lying: Clamshell + RTB 10x3" Reverse clamshell x10 --> added YTB around ankles x10 Gait: Gait training with SPC --> equal stride length & AD placement   PATIENT EDUCATION:  Education details: Added resistance bands to clamshell variations Person educated: Patient Education method: Explanation, Demonstration, Tactile cues, Verbal cues, and  Handouts Education comprehension: verbalized understanding, returned demonstration, verbal cues required, tactile cues required, and needs further education  HOME EXERCISE PROGRAM: Access Code: [email or web address] URL: [email or web address] Date: 03/04/2024 Prepared by: Celyn Holt  Exercises - Supine March  - 2 x daily - 7 x weekly - 1-2 sets - 3 reps - 2 sec  hold - Supine Lower Trunk Rotation  - 2 x daily - 7 x weekly - 1 sets - 3-5 reps - 20-30 sec  hold - Sidelying Hip Abduction  - 1 x daily - 7 x weekly - 3 sets - 10 reps - 3-5 sec  hold - Clamshell  - 2 x daily - 7 x weekly - 1 sets - 10 reps - 2-3 sec  hold added RTB - Sidelying Reverse Clamshell  - 2 x daily - 7 x weekly - 1-2 sets - 10 reps - 2-3 sec  hold added YTB - Supine Diaphragmatic Breathing  - 2 x daily - 7 x weekly - 1 sets - 10 reps -  4-6 sec  hold - Supine Quad Set  - 2 x daily - 7 x weekly - 1 sets - 10 reps - 3 sec  hold - Small Range Straight Leg Raise  - 2 x daily - 7 x weekly - 1 sets - 10 reps - 5 sec  hold - Hooklying Hamstring Stretch with Strap  - 2 x daily - 7 x weekly - 1 sets - 3 reps - 30 sec  hold - Supine Alternating Shoulder Flexion  - 2 x daily - 7 x weekly - 1-2 sets - 10 reps - 2 sec  hold  ASSESSMENT:  CLINICAL IMPRESSION: Pain reported in subjective intake decrease significantly after warm-up on NuStep. Patient able to ambulate community distances with SPC, tolerating 200 feet with reciprocal gait pattern. Short arc attempted, however discontinued d/t pain at posterior knee on Lt LE. Quad set and small range straight leg raises focusing on quad activation mechanics performed with minimal pain.  EVAL: Patient is a 63 y.o. female who was seen today for physical therapy evaluation and treatment for R sided LBP; chronic R knee pain. Patient reports that she fell back into her coffee tale after a nap 02/18/24 striking her R LB and posterior hip. She has had severe pain in the R back and hip since that time. She reports chronic R knee pain in the past 5 years. She has a history of L knee surgery ? Date and continues to have limited ROM and strength L LE. Patient has forward flexed posture and alignment; difficulty with all transfers and transitional movements; limited trunk and LE mobility/ROM; decreased core and LE strength; abnormal and unsafe gait pattern with cane that was too tall for her(adjusted today and pt was instructed in proper three point gait pattern). Patient has pain limiting all functional activities. She will benefit from PT  to address problems identified.    GOALS: Goals reviewed with patient? Yes  SHORT TERM GOALS: Target date: 03/27/2024  Independent in initial HEP  Baseline: Goal status: INITIAL  2.  Safe gait for home and short distances of 50-100 ft with appropriate assistive device   Baseline:  Goal status: INITIAL  3.  Decrease pain by 25-50% allowing patient to increase normal activities  Baseline:  Goal status: INITIAL    LONG TERM GOALS: Target date: 04/24/2024  Increase trunk ROM to 50-70% with minimal to no pain  Baseline:  Goal status: INITIAL  2.  Patient reports  ability to sit for 30 min and stand for 10 min without increased pain  Baseline:  Goal status: INITIAL  3.  Increased ROM bilt knee extension to (-)10 degrees and flexion to 100 deg R and 90 deg L  Baseline:  Goal status: INITIAL  4.  Increase strength to 4/5 to 4+/5 bialt LE's  Baseline:  Goal status: INITIAL  5.  Independent in HEP including aquatic program as indicated  Baseline:  Goal status: INITIAL  6.  Improve modified Owestry score by 10-15 points  Baseline: 37/50; 74%  Goal status: INITIAL  PLAN:  PT FREQUENCY: 2x/week  PT DURATION: 8 weeks  PLANNED INTERVENTIONS: 97110-Therapeutic exercises, 97530- Therapeutic activity, 97112- Neuromuscular re-education, 97535- Self Care, 91478- Manual therapy, [phone]- Gait training, [phone]- Aquatic Therapy, Patient/Family education, Balance training, Stair training, Taping, Dry Needling, and Joint mobilization.  PLAN FOR NEXT SESSION: review and progress exercises; continue with spine care and ergonomic education; manual work and modalities as indicated    Flint Hummer, PTA 03/18/2024, 11:48 AM

## 2024-03-21 ENCOUNTER — Ambulatory Visit: Admitting: Rehabilitative and Restorative Service Providers"

## 2024-03-21 ENCOUNTER — Encounter: Payer: Self-pay | Admitting: Rehabilitative and Restorative Service Providers"

## 2024-03-21 DIAGNOSIS — R2689 Other abnormalities of gait and mobility: Secondary | ICD-10-CM | POA: Diagnosis not present

## 2024-03-21 DIAGNOSIS — G8929 Other chronic pain: Secondary | ICD-10-CM

## 2024-03-21 DIAGNOSIS — M6281 Muscle weakness (generalized): Secondary | ICD-10-CM

## 2024-03-21 DIAGNOSIS — R29898 Other symptoms and signs involving the musculoskeletal system: Secondary | ICD-10-CM

## 2024-03-21 DIAGNOSIS — M5459 Other low back pain: Secondary | ICD-10-CM

## 2024-03-21 DIAGNOSIS — M25561 Pain in right knee: Secondary | ICD-10-CM | POA: Diagnosis not present

## 2024-03-21 NOTE — Therapy (Signed)
 OUTPATIENT PHYSICAL THERAPY THORACOLUMBAR TREATMENT   Patient Name: Carol Franklin MRN: [id number] DOB:1961-07-15, 63 y.o., female Today's Date: 03/21/2024  END OF SESSION:  PT End of Session - 03/21/24 1112     Visit Number 7    Number of Visits 16    Date for PT Re-Evaluation 04/24/24    Authorization Type [employer] fereral plan copay $35    Authorization Time Period year - 50 visits per year - 37remaining    Authorization - Visit Number 7    Authorization - Number of Visits 37    PT Start Time 1108   patient late for appt   PT Stop Time 1147    PT Time Calculation (min) 39 min            Past Medical History:  Diagnosis Date   Abnormal EKG    BMI 35.0-35.9,adult 01/06/2012   Chronic pain of both knees 01/13/2020   Chronic right-sided low back pain with right-sided sciatica 10/21/2020   Eczema 02/14/2020   Essential thrombocytosis (HCC) 01/13/2012   Fatigue 10/21/2020   Hyperlipidemia 01/13/2012   Hypertension    Intertrigo    Loud snoring 10/21/2020   Menopausal hot flushes 09/17/2014   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/2012   Osteopenia 12/04/2015   Person encountering health services to consult on behalf of another person 03/03/2023   Post-operative nausea and vomiting    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   Weight gain 02/14/2020   Past Surgical History:  Procedure Laterality Date   CESAREAN SECTION     X2   DILATION AND CURETTAGE OF UTERUS     KNEE SURGERY     left   Patient Active Problem List   Diagnosis Date Noted   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   Post-operative nausea and vomiting    Intertrigo    Hypertension    Abnormal EKG    Person encountering health services to consult on behalf of another person 03/03/2023   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   Fatigue 10/21/2020   Chronic right-sided low back pain with right-sided sciatica 10/21/2020   Loud snoring 10/21/2020   Eczema 02/14/2020   Weight gain 02/14/2020   Chronic pain of  both knees 01/13/2020   Osteopenia 12/04/2015   Menopausal hot flushes 09/17/2014   Essential thrombocytosis (HCC) 01/13/2012   Hyperlipidemia 01/13/2012   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/2012   BMI 35.0-35.9,adult 01/06/2012    PCP: Cherre Cornish, NP  REFERRING PROVIDER: Cherre Cornish, NP  REFERRING DIAG: R sided LBP; chronic R knee   Rationale for Evaluation and Treatment: Rehabilitation  THERAPY DIAG:  Other low back pain  Other symptoms and signs involving the musculoskeletal system  Chronic pain of right knee  Muscle weakness (generalized)  Other abnormalities of gait and mobility  ONSET DATE: 02/18/24  SUBJECTIVE:  SUBJECTIVE STATEMENT: Patient reports continued pain in both knees. Would like to do water therapy. Both knees hurt. Worse with more driving yesterday.    EVAL: Patient reports that she was napping 02/18/24 on her sofa and awoke and stumbled backward hitting her R LB to R hip. She has noted some improvement but is still having pain the back and hip. She has a history R knee pain for the past 5 years. She has had some PT for the R knee but did not continue to exercise. She has gained ~ 20 pounds in the past year.   PERTINENT HISTORY:  Denies medical problems; L knee injury not on R   PAIN:  Are you having pain? Yes: NPRS scale: 4/10 Pain location: R LB to R posterior hip; now pain is on the L LB area   Pain description: sharp sometimes; dull; aching  Aggravating factors: standing > 5 min; walking > 3 min; sitting > 15 min Relieving factors: lying on L side; ice   Are you having pain? Yes: NPRS scale: 7/10  Pain location: inside area of the R/L knee and below the kneecap  Pain description: sharp; achy  Aggravating factors: sleeping; moving on the knee - standing or walking  Relieving  factors: ice   PRECAUTIONS: None  WEIGHT BEARING RESTRICTIONS: No  FALLS:  Has patient fallen in last 6 months? Yes. 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  DIAGNOSTIC FINDINGS:  Xray 02/26/24 - results not available   PATIENT SURVEYS:  Modified Oswestry 37/50; 74%   SENSATION: WFL  MUSCLE LENGTH: Hamstrings: Right 55 deg; Left 55 deg Thomas test: tight hip flexors   POSTURE: rounded shoulders, forward head, increased thoracic kyphosis, and flexed trunk   PALPATION: Tender and tight R posterior hip and lumbar paraspinals to lats - transverse abdominals   MOBILITY/TRANSFERS;  Patient with difficulty with all transitional movements sit to stand; stand to sit; sit to supine; rolling to either side; unable to assume prone position   LUMBAR ROM:   AROM eval  Flexion 30% hands on thighs pain R LB  Extension 20% painful   Right lateral flexion 60% discomfort   Left lateral flexion 55% discomfort   Right rotation 10% stiff/tight   Left rotation 10% stiff/tight    (Blank rows = not tested)  LOWER EXTREMITY ROM:   limited throughout bilat LE's R > L   Active  Right eval Left eval  Hip flexion 100 95  Hip extension Unable to achieve neutral  Unable to achieve neutral  Hip abduction    Hip adduction    Hip internal rotation    Hip external rotation    Knee flexion -22 -20  Knee extension 89 78  Ankle  dorsiflexion    Ankle plantarflexion    Ankle inversion    Ankle eversion     (Blank rows = not tested)  LOWER EXTREMITY MMT:  difficult to assess strength - patient mobility limited  MMT Right eval Left eval  Hip flexion 4- 4-  Hip extension    Hip abduction 3+ 3+  Hip adduction    Hip internal rotation    Hip external rotation    Knee flexion 4 4-  Knee extension 4 4-  Ankle dorsiflexion    Ankle plantarflexion    Ankle inversion    Ankle eversion     (Blank rows = not tested)  LUMBAR SPECIAL TESTS:  Straight leg raise test: Negative and Slump test: Negative  FUNCTIONAL TESTS:  5 times sit to stand: unable to assess   GAIT: Distance walked: 40 feet  Assistive device utilized: Single point cane which was too high for patient  Level of assistance: Complete Independence Comments: antalgic gait; trunk, hips, knees flexed; decreased wt bearing R LE with stance to push off   [facility] Adult PT Treatment:                                                DATE: 03/21/2024 Therapeutic Exercise: NuStep L5 x 5 min Hamstring stretch sitting hinged hip 30 sec x 2 R/L  Trunk rotation supine x 10  Neuromuscular re-ed: Hooklying hip add & abd isometric 10x5" each Bridges + hip abd iso with green TB distal thighs 2x10 Quad set --> small range SLR  Small range SLR 10x5" Therapeutic Activity: Shoulder flexion red TB btn hands x 10  SASH supine red TB x 15 R/L  Dead bug x 10  Standing mini squats   [facility] Adult PT Treatment:                                                DATE: 03/18/2024 Therapeutic Exercise: NuStep L3 x 5 min Heel slides with orange PB Hooklying clamshell with black TB x10  Neuromuscular re-ed: Hooklying hip add & abd isometric 10x5" each Bridges + hip abd iso with gait belt around thighs 2x10 Small range SLR 10x5" SAQ (green bolster) --> discontinued d/t pain Quad set --> small range SLR Therapeutic Activity: Walking 272ft + SPC --> adjusted SPC for proper  fit Standing mini squats   [facility] Adult PT Treatment:                                                DATE: 03/14/2024 Therapeutic Exercise: Supine: LTR x 1 min Alternating SKTC --> leg stretch down  Heel slides with orange PB R hip flexor/quad stretch off edge of table  Standing lumbar extension stretch Manual Therapy: IASTM medial gastroc, distal adductors, anteriolateral quads Neuromuscular re-ed: Small range --> parallel & ER 10x5" each Supine bridges Resisted lateral step out/in + YTB crossed at ankles --> slow step in for control Modalities: Moist heat lumbar x 10 min    [facility] Adult PT Treatment:                                                DATE: 03/11/2024 Manual:   STM L  lumbar paraspinals ; lats; QL patient R sidelying pillow between knees   Myofacial release L lumbar musculature  Distal release hip to disassociate hip and ribs    Therapeutic Exercise: Sidelying  Depression L knee  Supine: SKTC x 10 R/L  LTR x 1 min Wide leg LTR x 1 min Marching  Sitting  Gentle side bend to R 15 sec x 4 Neuromuscular re-ed: Supine:  Quad set with pillow underneath knee 10x5" Small range SLR 10x5" Side Lying: Clamshell + RTB 10x3" Reverse clamshell x10 --> added YTB around ankles x10 Gait: Gait training with SPC --> equal stride length & AD placement   PATIENT EDUCATION:  Education details: Added resistance bands to clamshell variations Person educated: Patient Education method: Explanation, Demonstration, Tactile cues, Verbal cues, and Handouts Education comprehension: verbalized understanding, returned demonstration, verbal cues required, tactile cues required, and needs further education  HOME EXERCISE PROGRAM: Access Code: [email or web address] Access Code: [email or web address] URL: [email or web address] Date: 03/21/2024 Prepared by: Emet Rafanan  Exercises - Supine March  - 2 x daily - 7 x weekly - 1-2 sets - 3 reps - 2 sec  hold - Supine Lower Trunk Rotation  - 2 x daily -  7 x weekly - 1 sets - 3-5 reps - 20-30 sec  hold - Sidelying Hip Abduction  - 1 x daily - 7 x weekly - 3 sets - 10 reps - 3-5 sec  hold - Clamshell  - 2 x daily - 7 x weekly - 1 sets - 10 reps - 2-3 sec  hold - Sidelying Reverse Clamshell  - 2 x daily - 7 x weekly - 1-2 sets - 10 reps - 2-3 sec  hold - Supine Diaphragmatic Breathing  - 2 x daily - 7 x weekly - 1 sets - 10 reps - 4-6 sec  hold - Supine Quad Set  - 2 x daily - 7 x weekly - 1 sets - 10 reps - 3 sec  hold - Small Range Straight Leg Raise  - 2 x daily - 7 x weekly - 1 sets - 10 reps - 5 sec  hold - Hooklying Hamstring Stretch with Strap  - 2 x daily - 7 x weekly - 1 sets - 3 reps - 30 sec  hold - Supine Alternating Shoulder Flexion  - 2 x daily - 7 x weekly - 1-2 sets - 10 reps - 2 sec  hold - Dead Bug  - 1 x daily - 7 x weekly - 1-2 sets - 10 reps - 2 sec  hold - Seated Shoulder Diagonal Pulls with Resistance  - 1 x daily - 7 x weekly - 1-2 sets - 10 reps - 3 sec  hold  ASSESSMENT:  CLINICAL IMPRESSION: Pain in LB is improved. Continued pain in bilat knees. reported in subjective intake decrease significantly after warm-up on NuStep. Reviewed and progressed exercises. Focus on core strengthening and stabilization.   EVAL: Patient is a 63 y.o. female who was seen today for physical therapy evaluation and treatment for R sided LBP; chronic R knee pain. Patient reports that she fell back into her coffee tale after a nap 02/18/24 striking her R LB and posterior hip. She has had severe pain in the R back and hip since that time. She reports chronic R knee pain in the past 5 years. She has a history of L knee surgery ? Date and continues to have limited ROM and strength L LE. Patient has forward flexed posture and alignment;  difficulty with all transfers and transitional movements; limited trunk and LE mobility/ROM; decreased core and LE strength; abnormal and unsafe gait pattern with cane that was too tall for her(adjusted today and pt was  instructed in proper three point gait pattern). Patient has pain limiting all functional activities. She will benefit from PT  to address problems identified.    GOALS: Goals reviewed with patient? Yes  SHORT TERM GOALS: Target date: 03/27/2024  Independent in initial HEP  Baseline: Goal status: INITIAL  2.  Safe gait for home and short distances of 50-100 ft with appropriate assistive device  Baseline:  Goal status: INITIAL  3.  Decrease pain by 25-50% allowing patient to increase normal activities  Baseline:  Goal status: INITIAL    LONG TERM GOALS: Target date: 04/24/2024  Increase trunk ROM to 50-70% with minimal to no pain  Baseline:  Goal status: INITIAL  2.  Patient reports ability to sit for 30 min and stand for 10 min without increased pain  Baseline:  Goal status: INITIAL  3.  Increased ROM bilt knee extension to (-)10 degrees and flexion to 100 deg R and 90 deg L  Baseline:  Goal status: INITIAL  4.  Increase strength to 4/5 to 4+/5 bilat LE's  Baseline:  Goal status: INITIAL  5.  Independent in HEP including aquatic program as indicated  Baseline:  Goal status: INITIAL  6.  Improve modified Owestry score by 10-15 points  Baseline: 37/50; 74%  Goal status: INITIAL  PLAN:  PT FREQUENCY: 2x/week  PT DURATION: 8 weeks  PLANNED INTERVENTIONS: 97110-Therapeutic exercises, 97530- Therapeutic activity, 97112- Neuromuscular re-education, 97535- Self Care, [id number]- Manual therapy, [phone]- Gait training, [phone]- Aquatic Therapy, Patient/Family education, Balance training, Stair training, Taping, Dry Needling, and Joint mobilization.  PLAN FOR NEXT SESSION: review and progress exercises; continue with spine care and ergonomic education; manual work and modalities as indicated    Dariel Betzer P Marcianna Daily, PT 03/21/2024, 11:13 AM

## 2024-03-25 ENCOUNTER — Ambulatory Visit: Admitting: Rehabilitative and Restorative Service Providers"

## 2024-03-25 ENCOUNTER — Encounter: Payer: Self-pay | Admitting: Rehabilitative and Restorative Service Providers"

## 2024-03-25 DIAGNOSIS — R29898 Other symptoms and signs involving the musculoskeletal system: Secondary | ICD-10-CM | POA: Diagnosis not present

## 2024-03-25 DIAGNOSIS — G8929 Other chronic pain: Secondary | ICD-10-CM

## 2024-03-25 DIAGNOSIS — M25561 Pain in right knee: Secondary | ICD-10-CM | POA: Diagnosis not present

## 2024-03-25 DIAGNOSIS — R2689 Other abnormalities of gait and mobility: Secondary | ICD-10-CM | POA: Diagnosis not present

## 2024-03-25 DIAGNOSIS — M5459 Other low back pain: Secondary | ICD-10-CM | POA: Diagnosis not present

## 2024-03-25 DIAGNOSIS — M6281 Muscle weakness (generalized): Secondary | ICD-10-CM | POA: Diagnosis not present

## 2024-03-25 NOTE — Therapy (Signed)
 OUTPATIENT PHYSICAL THERAPY THORACOLUMBAR TREATMENT   Patient Name: Carol Franklin MRN: [id number] DOB:1961/09/12, 63 y.o., female Today's Date: 03/25/2024  END OF SESSION:  PT End of Session - 03/25/24 1022     Visit Number 8    Number of Visits 16    Date for PT Re-Evaluation 04/24/24    Authorization Type [employer] fereral plan copay $35    Authorization Time Period year - 50 visits per year - 37remaining    Authorization - Visit Number 8    Authorization - Number of Visits 37    PT Start Time 1018    PT Stop Time 1100    PT Time Calculation (min) 42 min    Activity Tolerance Patient tolerated treatment well            Past Medical History:  Diagnosis Date   Abnormal EKG    BMI 35.0-35.9,adult 01/06/2012   Chronic pain of both knees 01/13/2020   Chronic right-sided low back pain with right-sided sciatica 10/21/2020   Eczema 02/14/2020   Essential thrombocytosis (HCC) 01/13/2012   Fatigue 10/21/2020   Hyperlipidemia 01/13/2012   Hypertension    Intertrigo    Loud snoring 10/21/2020   Menopausal hot flushes 09/17/2014   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/2012   Osteopenia 12/04/2015   Person encountering health services to consult on behalf of another person 03/03/2023   Post-operative nausea and vomiting    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   Weight gain 02/14/2020   Past Surgical History:  Procedure Laterality Date   CESAREAN SECTION     X2   DILATION AND CURETTAGE OF UTERUS     KNEE SURGERY     left   Patient Active Problem List   Diagnosis Date Noted   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   Post-operative nausea and vomiting    Intertrigo    Hypertension    Abnormal EKG    Person encountering health services to consult on behalf of another person 03/03/2023   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   Fatigue 10/21/2020   Chronic right-sided low back pain with right-sided sciatica 10/21/2020   Loud snoring 10/21/2020   Eczema 02/14/2020   Weight  gain 02/14/2020   Chronic pain of both knees 01/13/2020   Osteopenia 12/04/2015   Menopausal hot flushes 09/17/2014   Essential thrombocytosis (HCC) 01/13/2012   Hyperlipidemia 01/13/2012   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/2012   BMI 35.0-35.9,adult 01/06/2012    PCP: Cherre Cornish, NP  REFERRING PROVIDER: Cherre Cornish, NP  REFERRING DIAG: R sided LBP; chronic R knee   Rationale for Evaluation and Treatment: Rehabilitation  THERAPY DIAG:  Other low back pain  Other symptoms and signs involving the musculoskeletal system  Chronic pain of right knee  Muscle weakness (generalized)  Other abnormalities of gait and mobility  ONSET DATE: 02/18/24  SUBJECTIVE:  SUBJECTIVE STATEMENT: Patient reports continued pain in both knees, worse in the R knee. L knee feels limited. Would like to do water therapy. Some tightness in the mornings. She can feel she is improving. Working on exercises some at home.     EVAL: Patient reports that she was napping 02/18/24 on her sofa and awoke and stumbled backward hitting her R LB to R hip. She has noted some improvement but is still having pain the back and hip. She has a history R knee pain for the past 5 years. She has had some PT for the R knee but did not continue to exercise. She has gained ~ 20 pounds in the past year.   PERTINENT HISTORY:  Denies medical problems; L knee injury not on R   PAIN:  Are you having pain? Yes: NPRS scale: 5/10 Pain location: R LB to R posterior hip; now pain is on the L LB area   Pain description: sharp sometimes; dull; aching  Aggravating factors: standing > 5 min; walking > 3 min; sitting > 15 min Relieving factors: lying on L side; ice   Are you having pain? Yes: NPRS scale: 6/10  Pain location: inside area of the R/L knee and below the  kneecap  Pain description: sharp; achy  Aggravating factors: sleeping; moving on the knee - standing or walking  Relieving factors: ice   PRECAUTIONS: None  WEIGHT BEARING RESTRICTIONS: No  FALLS:  Has patient fallen in last 6 months? Yes. 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  DIAGNOSTIC FINDINGS:  Xray 02/26/24 - results not available   PATIENT SURVEYS:  Modified Oswestry 37/50; 74%   SENSATION: WFL  MUSCLE LENGTH: Hamstrings: Right 55 deg; Left 55 deg Thomas test: tight hip flexors   POSTURE: rounded shoulders, forward head, increased thoracic kyphosis, and flexed trunk   PALPATION: Tender and tight R posterior hip and lumbar paraspinals to lats - transverse abdominals   MOBILITY/TRANSFERS;  Patient with difficulty with all transitional movements sit to stand; stand to sit; sit to supine; rolling to either side; unable to assume prone position   LUMBAR ROM:   AROM eval  Flexion 30% hands on thighs pain R LB  Extension 20% painful   Right lateral flexion 60% discomfort   Left lateral flexion 55% discomfort   Right rotation 10% stiff/tight   Left rotation 10% stiff/tight    (Blank rows = not tested)  LOWER EXTREMITY ROM:   limited throughout bilat LE's R > L   Active  Right eval Left eval  Hip flexion 100 95  Hip extension Unable to achieve neutral  Unable to achieve neutral  Hip abduction     Hip adduction    Hip internal rotation    Hip external rotation    Knee flexion -22 -20  Knee extension 89 78  Ankle dorsiflexion    Ankle plantarflexion    Ankle inversion    Ankle eversion     (  Blank rows = not tested)  LOWER EXTREMITY MMT:  Eval: difficult to assess strength - patient mobility limited          03/25/24: improving mobility to assess LE strength   MMT Right eval Left eval Right  03/25/24 Left  03/25/24  Hip flexion 4- 4- 4 4  Hip extension      Hip abduction 3+ 3+ 4- 4-  Hip adduction      Hip internal rotation      Hip external rotation      Knee flexion 4 4- 4 4  Knee extension 4 4- 4 4  Ankle dorsiflexion      Ankle plantarflexion      Ankle inversion      Ankle eversion       (Blank rows = not tested)  LUMBAR SPECIAL TESTS:  Straight leg raise test: Negative and Slump test: Negative  FUNCTIONAL TESTS:  5 times sit to stand: unable to assess   GAIT: Distance walked: 40 feet  Assistive device utilized: Single point cane which was too high for patient  Level of assistance: Complete Independence Comments: antalgic gait; trunk, hips, knees flexed; decreased wt bearing R LE with stance to push off  [facility] Adult PT Treatment:                                                DATE: 03/25/2024 Therapeutic Exercise: NuStep L6 x 7 min Trunk rotation supine x 10  Neuromuscular re-ed: Supine hooklying hip abd green TB 3 sec x 10 R/L Bridges + hip abd iso with green TB distal thighs 2x10 Quad set --> small range SLR 3 sec x 10 R/L Small range SLR in ER 3 sec x 10 R/L Heel slides with orange physio ball x 10   Therapeutic Activity: Standing mini wall squat 5-10 sec x 5 reps Shoulder flexion green TB btn hands x 10  SASH supine green TB x 15 R/L x 10 x 2 sets  Dead bug x 10  Standing mini squats Sidelying clamshell green TB 3 sec x 10 R/L Sidelying reverse clamshell no resistance 3 sec x 10  R/L   [facility] Adult PT Treatment:                                                 DATE: 03/21/2024 Therapeutic Exercise: NuStep L5 x 5 min Hamstring stretch sitting hinged hip 30 sec x 2 R/L  Trunk rotation supine x 10  Neuromuscular re-ed: Hooklying hip add & abd isometric 10x5" each Bridges + hip abd iso with green TB distal thighs 2x10 Quad set --> small range SLR  Small range SLR 10x5" Therapeutic Activity: Shoulder flexion red TB btn hands x 10  SASH supine red TB x 15 R/L  Dead bug x 10  Standing mini squats   [facility] Adult PT Treatment:                                                DATE: 03/18/2024 Therapeutic Exercise: NuStep L3 x 5 min Heel slides with orange PB Hooklying  clamshell with black TB x10  Neuromuscular re-ed: Hooklying hip add & abd isometric 10x5" each Bridges + hip abd iso with gait belt around thighs 2x10 Small range SLR 10x5" SAQ (green bolster) --> discontinued d/t pain Quad set --> small range SLR Therapeutic Activity: Walking 245ft + SPC --> adjusted SPC for proper fit Standing mini squats   PATIENT EDUCATION:  Education details: Added resistance bands to clamshell variations Person educated: Patient Education method: Explanation, Demonstration, Tactile cues, Verbal cues, and Handouts Education comprehension: verbalized understanding, returned demonstration, verbal cues required, tactile cues required, and needs further education  HOME EXERCISE PROGRAM: Access Code: [email or web address] URL: [email or web address] Date: 03/25/2024 Prepared by: Lasonya Hubner  Exercises - Supine March  - 2 x daily - 7 x weekly - 1-2 sets - 3 reps - 2 sec  hold - Supine Lower Trunk Rotation  - 2 x daily - 7 x weekly - 1 sets - 3-5 reps - 20-30 sec  hold - Sidelying Hip Abduction  - 1 x daily - 7 x weekly - 3 sets - 10 reps - 3-5 sec  hold - Clamshell  - 2 x daily - 7 x weekly - 1 sets - 10 reps - 2-3 sec  hold - Sidelying Reverse Clamshell  - 2 x daily - 7 x weekly - 1-2 sets - 10 reps - 2-3 sec  hold - Supine Diaphragmatic  Breathing  - 2 x daily - 7 x weekly - 1 sets - 10 reps - 4-6 sec  hold - Supine Quad Set  - 2 x daily - 7 x weekly - 1 sets - 10 reps - 3 sec  hold - Small Range Straight Leg Raise  - 2 x daily - 7 x weekly - 1 sets - 10 reps - 5 sec  hold - Hooklying Hamstring Stretch with Strap  - 2 x daily - 7 x weekly - 1 sets - 3 reps - 30 sec  hold - Supine Alternating Shoulder Flexion  - 2 x daily - 7 x weekly - 1-2 sets - 10 reps - 2 sec  hold - Dead Bug  - 1 x daily - 7 x weekly - 1-2 sets - 10 reps - 2 sec  hold - Seated Shoulder Diagonal Pulls with Resistance  - 1 x daily - 7 x weekly - 1-2 sets - 10 reps - 3 sec  hold - Wall Quarter Squat  - 2 x daily - 7 x weekly - 1-2 sets - 10 reps - 5-10 sec  hold  ASSESSMENT:  CLINICAL IMPRESSION: Pain in LB is improving. Continued pain in bilat knees R > L. Improving exercise tolerance with increasing resistance, reps, exercise difficulty.  Focus on core strengthening and stabilization. Patient has accomplished 2 of 3 short term goals and one long term goal. Progressing gradually toward stated goals of therapy. PT remains appropriate.    EVAL: Patient is a 63 y.o. female who was seen today for physical therapy evaluation and treatment for R sided LBP; chronic R knee pain. Patient reports that she fell back into her coffee tale after a nap 02/18/24 striking her R LB and posterior hip. She has had severe pain in the R back and hip since that time. She reports chronic R knee pain in the past 5 years. She has a history of L knee surgery ? Date and continues to have limited ROM and strength L LE. Patient has forward flexed posture and alignment; difficulty with all transfers and  transitional movements; limited trunk and LE mobility/ROM; decreased core and LE strength; abnormal and unsafe gait pattern with cane that was too tall for her(adjusted today and pt was instructed in proper three point gait pattern). Patient has pain limiting all functional activities. She will  benefit from PT  to address problems identified.    GOALS: Goals reviewed with patient? Yes  SHORT TERM GOALS: Target date: 03/27/2024  Independent in initial HEP  Baseline: Goal status: met  2.  Safe gait for home and short distances of 50-100 ft with appropriate assistive device  Baseline:  Goal status: met  3.  Decrease pain by 25-50% allowing patient to increase normal activities  Baseline:  Goal status: on going     LONG TERM GOALS: Target date: 04/24/2024  Increase trunk ROM to 50-70% with minimal to no pain  Baseline:  Goal status: on going   2.  Patient reports ability to sit for 30 min and stand for 10 min without increased pain  Baseline:  Goal status: met  3.  Increased ROM bilt knee extension to (-)10 degrees and flexion to 100 deg R and 90 deg L  Baseline:  Goal status: on going   4.  Increase strength to 4/5 to 4+/5 bilat LE's  Baseline:  Goal status: INITIAL  5.  Independent in HEP including aquatic program as indicated  Baseline:  Goal status: on going   6.  Improve modified Owestry score by 10-15 points  Baseline: 37/50; 74%  Goal status: on going   PLAN:  PT FREQUENCY: 2x/week  PT DURATION: 8 weeks  PLANNED INTERVENTIONS: 97110-Therapeutic exercises, 97530- Therapeutic activity, 97112- Neuromuscular re-education, 97535- Self Care, 16109- Manual therapy, [phone]- Gait training, [phone]- Aquatic Therapy, Patient/Family education, Balance training, Stair training, Taping, Dry Needling, and Joint mobilization.  PLAN FOR NEXT SESSION: review and progress exercises; continue with spine care and ergonomic education; core strengthening and stabilization; LE strengthening; manual work and modalities as indicated    Cheril Slattery P Samier Jaco, PT 03/25/2024, 10:23 AM

## 2024-03-28 ENCOUNTER — Ambulatory Visit: Admitting: Rehabilitative and Restorative Service Providers"

## 2024-03-28 ENCOUNTER — Encounter: Payer: Self-pay | Admitting: Rehabilitative and Restorative Service Providers"

## 2024-03-28 DIAGNOSIS — R29898 Other symptoms and signs involving the musculoskeletal system: Secondary | ICD-10-CM | POA: Diagnosis not present

## 2024-03-28 DIAGNOSIS — G8929 Other chronic pain: Secondary | ICD-10-CM

## 2024-03-28 DIAGNOSIS — M5459 Other low back pain: Secondary | ICD-10-CM

## 2024-03-28 DIAGNOSIS — R2689 Other abnormalities of gait and mobility: Secondary | ICD-10-CM

## 2024-03-28 DIAGNOSIS — M25561 Pain in right knee: Secondary | ICD-10-CM | POA: Diagnosis not present

## 2024-03-28 DIAGNOSIS — M6281 Muscle weakness (generalized): Secondary | ICD-10-CM

## 2024-03-28 NOTE — Therapy (Signed)
 OUTPATIENT PHYSICAL THERAPY THORACOLUMBAR TREATMENT   Patient Name: Carol Franklin MRN: [id number] DOB:[birth year], 64 y.o., female Today's Date: 03/28/2024  END OF SESSION:  PT End of Session - 03/28/24 1011     Visit Number 9    Number of Visits 16    Date for PT Re-Evaluation 04/24/24    Authorization Type [employer] federal plan copay $35    Authorization Time Period year - 50 visits per year - 37remaining    Authorization - Visit Number 9    Authorization - Number of Visits 37    PT Start Time 1010    PT Stop Time 1055    PT Time Calculation (min) 45 min    Activity Tolerance Patient tolerated treatment well            Past Medical History:  Diagnosis Date   Abnormal EKG    BMI 35.0-35.9,adult 01/06/2012   Chronic pain of both knees 01/13/2020   Chronic right-sided low back pain with right-sided sciatica 10/21/2020   Eczema 02/14/2020   Essential thrombocytosis (HCC) 01/13/2012   Fatigue 10/21/2020   Hyperlipidemia 01/13/2012   Hypertension    Intertrigo    Loud snoring 10/21/2020   Menopausal hot flushes 09/17/2014   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/2012   Osteopenia 12/04/2015   Person encountering health services to consult on behalf of another person 03/03/2023   Post-operative nausea and vomiting    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   Weight gain 02/14/2020   Past Surgical History:  Procedure Laterality Date   CESAREAN SECTION     X2   DILATION AND CURETTAGE OF UTERUS     KNEE SURGERY     left   Patient Active Problem List   Diagnosis Date Noted   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   Post-operative nausea and vomiting    Intertrigo    Hypertension    Abnormal EKG    Person encountering health services to consult on behalf of another person 03/03/2023   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   Fatigue 10/21/2020   Chronic right-sided low back pain with right-sided sciatica 10/21/2020   Loud snoring 10/21/2020   Eczema 02/14/2020   Weight  gain 02/14/2020   Chronic pain of both knees 01/13/2020   Osteopenia 12/04/2015   Menopausal hot flushes 09/17/2014   Essential thrombocytosis (HCC) 01/13/2012   Hyperlipidemia 01/13/2012   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/2012   BMI 35.0-35.9,adult 01/06/2012    PCP: Cherre Cornish, NP  REFERRING PROVIDER: Cherre Cornish, NP  REFERRING DIAG: R sided LBP; chronic R knee   Rationale for Evaluation and Treatment: Rehabilitation  THERAPY DIAG:  Other low back pain  Other symptoms and signs involving the musculoskeletal system  Chronic pain of right knee  Muscle weakness (generalized)  Other abnormalities of gait and mobility  ONSET DATE: 02/18/24  SUBJECTIVE:  SUBJECTIVE STATEMENT: Patient reports that she feels stiff. She did go to [employer] yesterday and did a lot of walking. Working today. Pain is improving as the day goes on. She has some continued pain in the LB and both knees, worse in the R knee. L knee feels limited. Has a scheduled appointment for water therapy. She can feel she is improving. Working on exercises some at home.     EVAL: Patient reports that she was napping 02/18/24 on her sofa and awoke and stumbled backward hitting her R LB to R hip. She has noted some improvement but is still having pain the back and hip. She has a history R knee pain for the past 5 years. She has had some PT for the R knee but did not continue to exercise. She has gained ~ 20 pounds in the past year.   PERTINENT HISTORY:  Denies medical problems; L knee injury not on R   PAIN:  Are you having pain? Yes: NPRS scale: 5/10 Pain location: R LB to R posterior hip; now pain is on the L LB area   Pain description: sharp sometimes; dull; aching  Aggravating factors: standing > 5 min; walking > 3 min; sitting > 15 min Relieving  factors: lying on L side; ice   Are you having pain? Yes: NPRS scale: 6/10  Pain location: inside area of the R/L knee and below the kneecap  Pain description: sharp; achy  Aggravating factors: sleeping; moving on the knee - standing or walking  Relieving factors: ice   PRECAUTIONS: None  WEIGHT BEARING RESTRICTIONS: No  FALLS:  Has patient fallen in last 6 months? Yes. 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  DIAGNOSTIC FINDINGS:  Xray 02/26/24 - results not available   PATIENT SURVEYS:  Modified Oswestry 37/50; 74%   SENSATION: WFL  MUSCLE LENGTH: Hamstrings: Right 55 deg; Left 55 deg Thomas test: tight hip flexors   POSTURE: rounded shoulders, forward head, increased thoracic kyphosis, and flexed trunk   PALPATION: Tender and tight R posterior hip and lumbar paraspinals to lats - transverse abdominals   MOBILITY/TRANSFERS;  Patient with difficulty with all transitional movements sit to stand; stand to sit; sit to supine; rolling to either side; unable to assume prone position   LUMBAR ROM:   AROM eval  Flexion 30% hands on thighs pain R LB  Extension 20% painful   Right lateral flexion 60% discomfort   Left lateral flexion 55% discomfort   Right rotation 10% stiff/tight   Left rotation 10% stiff/tight    (Blank rows = not tested)  LOWER EXTREMITY ROM:   limited throughout bilat LE's R > L    Active  Right eval Left eval  Hip flexion 100 95  Hip extension Unable to achieve neutral  Unable to achieve neutral  Hip abduction    Hip adduction    Hip internal rotation    Hip external rotation    Knee flexion -  22 -20  Knee extension 89 78  Ankle dorsiflexion    Ankle plantarflexion    Ankle inversion    Ankle eversion     (Blank rows = not tested)  LOWER EXTREMITY MMT:  Eval: difficult to assess strength - patient mobility limited          03/25/24: improving mobility to assess LE strength   MMT Right eval Left eval Right  03/25/24 Left  03/25/24  Hip flexion 4- 4- 4 4  Hip extension      Hip abduction 3+ 3+ 4- 4-  Hip adduction      Hip internal rotation      Hip external rotation      Knee flexion 4 4- 4 4  Knee extension 4 4- 4 4  Ankle dorsiflexion      Ankle plantarflexion      Ankle inversion      Ankle eversion       (Blank rows = not tested)  LUMBAR SPECIAL TESTS:  Straight leg raise test: Negative and Slump test: Negative  FUNCTIONAL TESTS:  5 times sit to stand: unable to assess   GAIT: Distance walked: 40 feet  Assistive device utilized: Single point cane which was too high for patient  Level of assistance: Complete Independence Comments: antalgic gait; trunk, hips, knees flexed; decreased wt bearing R LE with stance to push off  [facility] Adult PT Treatment:                                                DATE: 03/28/2024 Therapeutic Exercise: NuStep L6 x 7 min Trunk rotation supine x 10  Neuromuscular re-ed: Supine hooklying hip abd green TB 3 sec x 10 R/L Bridges + hip abd iso with green TB distal thighs 2x10 Quad set --> small range SLR 3 sec x 10 R/L Small range SLR in ER 3 sec x 10 R/L Heel slides with orange physio ball x 10   Therapeutic Activity: Standing mini wall squat 5-10 sec x 5 reps Shoulder flexion green TB btn hands x 10  SASH supine green TB x 15 R/L x 10 x 2 sets  Dead bug x 10  Standing mini squats x 10  Hip abduction  standing leading with heel pause x 10 x 2 sets R/L  Sidelying clamshell green TB 3 sec x 10 R/L Sidelying reverse clamshell no resistance 3 sec x 10  R/L   [facility] Adult PT Treatment:                                                DATE: 03/25/2024 Therapeutic Exercise: NuStep L6 x 7 min Trunk rotation supine x 10  Neuromuscular re-ed: Supine hooklying isometric hip abd green TB 3 sec x 10 x 2 R/L Bridges + hip abd iso with green TB distal thighs 10 x 2 Quad set --> small range SLR 3 sec x 10 R/L Small range SLR in ER 3 sec x 10 R/L Table top bilat LEs hold 3 sec x 5 x 2 sets (one leg up and down at a time)   Therapeutic Activity: Standing mini wall squat 5-10 sec x 5 reps Shoulder flexion supine green TB btn hands x 10  SASH supine green TB R/L x 10 x 2 sets  Dead bug x 10 x 2 Standing mini squats x 10  Sidelying clamshell green TB 3 sec x 10 R/L Sidelying reverse clamshell no resistance 3 sec x 10  R/L   [facility] Adult PT Treatment:                                                DATE: 03/21/2024 Therapeutic Exercise: NuStep L5 x 5 min Hamstring stretch sitting hinged hip 30 sec x 2 R/L  Trunk rotation supine x 10  Neuromuscular re-ed: Hooklying hip add & abd isometric 10x5" each Bridges + hip abd iso with green TB distal thighs 2x10 Quad set --> small range SLR  Small range SLR 10x5" Therapeutic Activity: Shoulder flexion red TB btn hands x 10  SASH supine red TB x 15 R/L  Dead bug x 10  Standing mini squats   PATIENT EDUCATION:  Education details: Added resistance bands to clamshell variations Person educated: Patient Education method: Explanation, Demonstration, Tactile cues, Verbal cues, and Handouts Education comprehension: verbalized understanding, returned demonstration, verbal cues required, tactile cues required, and needs further education  HOME EXERCISE PROGRAM: Access Code: [email or web address] URL: [email or web address] Date: 03/28/2024 Prepared by: Alexzandrea Normington  Exercises - Supine March  - 2 x daily - 7 x weekly - 1-2 sets - 3 reps - 2 sec  hold - Supine Lower Trunk Rotation  - 2 x daily - 7 x weekly - 1 sets - 3-5 reps - 20-30 sec  hold - Sidelying Hip Abduction  - 1 x daily - 7 x weekly - 3 sets - 10 reps - 3-5 sec  hold - Clamshell  - 2 x daily - 7 x weekly - 1 sets - 10 reps - 2-3 sec  hold - Sidelying Reverse Clamshell  - 2 x daily - 7 x weekly - 1-2 sets - 10 reps - 2-3 sec  hold - Supine Diaphragmatic Breathing  - 2 x daily - 7 x weekly - 1 sets - 10 reps - 4-6 sec  hold - Supine Quad Set  - 2 x daily - 7 x weekly - 1 sets - 10 reps - 3 sec  hold - Small Range Straight Leg Raise  - 2 x daily - 7 x weekly - 1 sets - 10 reps - 5 sec  hold - Hooklying Hamstring Stretch with Strap  - 2 x daily - 7 x weekly - 1 sets - 3 reps - 30 sec  hold - Supine Alternating Shoulder Flexion  - 2 x daily - 7 x weekly - 1-2 sets - 10 reps - 2 sec  hold - Dead Bug  - 1 x daily - 7 x weekly - 1-2 sets - 10 reps - 2 sec  hold - Seated Shoulder Diagonal Pulls with Resistance  - 1 x daily - 7 x weekly - 1-2 sets - 10 reps - 3 sec  hold - Wall Quarter Squat  - 2 x daily - 7 x weekly - 1-2 sets - 10 reps - 5-10 sec  hold - Standing Hip Abduction with Counter Support  - 1 x daily - 7 x weekly - 2-3 sets - 10 reps - 2-3 sec  hold  ASSESSMENT:  CLINICAL IMPRESSION: Patient reports continued stiffness and some pain. The pain in  LB is improving. Continued pain in bilat knees R > L. Tolerated exercises at last visit well. Continued improvement with exercise tolerance with increasing resistance, reps, exercise difficulty.  Focus on core strengthening and stabilization.    EVAL: Patient is a 63 y.o. female who was seen today for physical therapy evaluation and treatment for R sided LBP; chronic R knee pain. Patient reports that she fell back into her coffee tale after a nap 02/18/24 striking her R LB and posterior hip. She has had severe pain in the R back and hip since that  time. She reports chronic R knee pain in the past 5 years. She has a history of L knee surgery ? Date and continues to have limited ROM and strength L LE. Patient has forward flexed posture and alignment; difficulty with all transfers and transitional movements; limited trunk and LE mobility/ROM; decreased core and LE strength; abnormal and unsafe gait pattern with cane that was too tall for her(adjusted today and pt was instructed in proper three point gait pattern). Patient has pain limiting all functional activities. She will benefit from PT  to address problems identified.    GOALS: Goals reviewed with patient? Yes  SHORT TERM GOALS: Target date: 03/27/2024  Independent in initial HEP  Baseline: Goal status: met  2.  Safe gait for home and short distances of 50-100 ft with appropriate assistive device  Baseline:  Goal status: met  3.  Decrease pain by 25-50% allowing patient to increase normal activities  Baseline:  Goal status: on going     LONG TERM GOALS: Target date: 04/24/2024  Increase trunk ROM to 50-70% with minimal to no pain  Baseline:  Goal status: on going   2.  Patient reports ability to sit for 30 min and stand for 10 min without increased pain  Baseline:  Goal status: met  3.  Increased ROM bilt knee extension to (-)10 degrees and flexion to 100 deg R and 90 deg L  Baseline:  Goal status: on going   4.  Increase strength to 4/5 to 4+/5 bilat LE's  Baseline:  Goal status: INITIAL  5.  Independent in HEP including aquatic program as indicated  Baseline:  Goal status: on going   6.  Improve modified Owestry score by 10-15 points  Baseline: 37/50; 74%  Goal status: on going   PLAN:  PT FREQUENCY: 2x/week  PT DURATION: 8 weeks  PLANNED INTERVENTIONS: 97110-Therapeutic exercises, 97530- Therapeutic activity, 97112- Neuromuscular re-education, 97535- Self Care, [id number]- Manual therapy, [phone]- Gait training, [phone]- Aquatic Therapy, Patient/Family  education, Balance training, Stair training, Taping, Dry Needling, and Joint mobilization.  PLAN FOR NEXT SESSION: review and progress exercises; continue with spine care and ergonomic education; core strengthening and stabilization; LE strengthening; manual work and modalities as indicated    Ardyce Heyer P Zyra Parrillo, PT 03/28/2024, 10:12 AM

## 2024-04-01 DIAGNOSIS — M5441 Lumbago with sciatica, right side: Secondary | ICD-10-CM | POA: Diagnosis not present

## 2024-04-01 DIAGNOSIS — M9905 Segmental and somatic dysfunction of pelvic region: Secondary | ICD-10-CM | POA: Diagnosis not present

## 2024-04-01 DIAGNOSIS — M25551 Pain in right hip: Secondary | ICD-10-CM | POA: Diagnosis not present

## 2024-04-01 DIAGNOSIS — M9903 Segmental and somatic dysfunction of lumbar region: Secondary | ICD-10-CM | POA: Diagnosis not present

## 2024-04-03 ENCOUNTER — Encounter: Payer: Self-pay | Admitting: Rehabilitative and Restorative Service Providers"

## 2024-04-03 ENCOUNTER — Ambulatory Visit: Admitting: Rehabilitative and Restorative Service Providers"

## 2024-04-03 DIAGNOSIS — R29898 Other symptoms and signs involving the musculoskeletal system: Secondary | ICD-10-CM | POA: Diagnosis not present

## 2024-04-03 DIAGNOSIS — M6281 Muscle weakness (generalized): Secondary | ICD-10-CM | POA: Diagnosis not present

## 2024-04-03 DIAGNOSIS — G8929 Other chronic pain: Secondary | ICD-10-CM | POA: Diagnosis not present

## 2024-04-03 DIAGNOSIS — M5459 Other low back pain: Secondary | ICD-10-CM

## 2024-04-03 DIAGNOSIS — R2689 Other abnormalities of gait and mobility: Secondary | ICD-10-CM | POA: Diagnosis not present

## 2024-04-03 DIAGNOSIS — M25561 Pain in right knee: Secondary | ICD-10-CM | POA: Diagnosis not present

## 2024-04-03 NOTE — Therapy (Signed)
 OUTPATIENT PHYSICAL THERAPY THORACOLUMBAR TREATMENT   Patient Name: Carol Franklin MRN: [id number] DOB:27-Feb-1961, 63 y.o., female Today's Date: 04/03/2024  END OF SESSION:  PT End of Session - 04/03/24 1024     Visit Number 10    Number of Visits 16    Date for PT Re-Evaluation 04/24/24    Authorization Type [employer] federal plan copay $35    Authorization Time Period year - 50 visits per year - 37remaining    Authorization - Visit Number 10    Authorization - Number of Visits 37    PT Start Time 1015    PT Stop Time 1100    PT Time Calculation (min) 45 min    Activity Tolerance Patient tolerated treatment well            Past Medical History:  Diagnosis Date   Abnormal EKG    BMI 35.0-35.9,adult 01/06/2012   Chronic pain of both knees 01/13/2020   Chronic right-sided low back pain with right-sided sciatica 10/21/2020   Eczema 02/14/2020   Essential thrombocytosis (HCC) 01/13/2012   Fatigue 10/21/2020   Hyperlipidemia 01/13/2012   Hypertension    Intertrigo    Loud snoring 10/21/2020   Menopausal hot flushes 09/17/2014   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/2012   Osteopenia 12/04/2015   Person encountering health services to consult on behalf of another person 03/03/2023   Post-operative nausea and vomiting    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   Weight gain 02/14/2020   Past Surgical History:  Procedure Laterality Date   CESAREAN SECTION     X2   DILATION AND CURETTAGE OF UTERUS     KNEE SURGERY     left   Patient Active Problem List   Diagnosis Date Noted   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   Post-operative nausea and vomiting    Intertrigo    Hypertension    Abnormal EKG    Person encountering health services to consult on behalf of another person 03/03/2023   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   Fatigue 10/21/2020   Chronic right-sided low back pain with right-sided sciatica 10/21/2020   Loud snoring 10/21/2020   Eczema 02/14/2020   Weight  gain 02/14/2020   Chronic pain of both knees 01/13/2020   Osteopenia 12/04/2015   Menopausal hot flushes 09/17/2014   Essential thrombocytosis (HCC) 01/13/2012   Hyperlipidemia 01/13/2012   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/2012   BMI 35.0-35.9,adult 01/06/2012    PCP: Cherre Cornish, NP  REFERRING PROVIDER: Cherre Cornish, NP  REFERRING DIAG: R sided LBP; chronic R knee   Rationale for Evaluation and Treatment: Rehabilitation  THERAPY DIAG:  Other low back pain  Other symptoms and signs involving the musculoskeletal system  Chronic pain of right knee  Muscle weakness (generalized)  Other abnormalities of gait and mobility  ONSET DATE: 02/18/24  SUBJECTIVE:  SUBJECTIVE STATEMENT: Patient reports that she is getting stronger. She had a busy weekend and her back got tired. The knee hurts more than the back. She has some continued pain in the LB and both knees, worse in the R knee. She can feel she is improving. Working on exercises some at home. Has a scheduled appointment for water therapy 04/19/24. Has an appointment with Dr Elva Hamburger 04/15/24. She is on vacation next week and will be doing a lot of driving.    EVAL: Patient reports that she was napping 02/18/24 on her sofa and awoke and stumbled backward hitting her R LB to R hip. She has noted some improvement but is still having pain the back and hip. She has a history R knee pain for the past 5 years. She has had some PT for the R knee but did not continue to exercise. She has gained ~ 20 pounds in the past year.   PERTINENT HISTORY:  Denies medical problems; L knee injury not on R   PAIN:  Are you having pain? Yes: NPRS scale: 4/10 Pain location: R LB to R posterior hip; now pain is on the L LB area   Pain description: sharp sometimes; dull; aching  Aggravating  factors: standing > 5 min; walking > 3 min; sitting > 15 min Relieving factors: lying on L side; ice   Are you having pain? Yes: NPRS scale: 7/10  Pain location: inside area of the R/L knee and below the kneecap  Pain description: sharp; achy  Aggravating factors: sleeping; moving on the knee - standing or walking  Relieving factors: ice   PRECAUTIONS: None  WEIGHT BEARING RESTRICTIONS: No  FALLS:  Has patient fallen in last 6 months? Yes. 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  DIAGNOSTIC FINDINGS:  Xray 02/26/24 - results not available   PATIENT SURVEYS:  Modified Oswestry 37/50; 74%  04/03/24: modified Oswestry 24/50; 48%  SENSATION: WFL  MUSCLE LENGTH: Hamstrings: Right 55 deg; Left 55 deg Thomas test: tight hip flexors   POSTURE: rounded shoulders, forward head, increased thoracic kyphosis, and flexed trunk   PALPATION: Tender and tight R posterior hip and lumbar paraspinals to lats - transverse abdominals   MOBILITY/TRANSFERS;  Patient with difficulty with all transitional movements sit to stand; stand to sit; sit to supine; rolling to either side; unable to assume prone position   LUMBAR ROM:   AROM eval 04/03/24  Flexion 30% hands on thighs pain R LB 75% Minimal pain "twinge"  Extension 20% painful  50% pull  Right lateral flexion 60% discomfort  90% pull   Left lateral flexion 55%  discomfort  70% min pain  Right rotation 10% stiff/tight  60%  Left rotation 10% stiff/tight  50% min pain    (Blank rows = not tested)  LOWER EXTREMITY ROM:   limited throughout bilat LE's R > L     04/03/24: hip and knee ROM remain limited  Active  Right eval Left eval  Hip flexion 100 95  Hip extension Unable to achieve neutral  Unable to achieve neutral  Hip abduction    Hip adduction    Hip internal rotation    Hip external rotation    Knee flexion -22 -20  Knee extension 89 78  Ankle dorsiflexion    Ankle plantarflexion    Ankle inversion    Ankle eversion     (Blank rows = not tested)  LOWER EXTREMITY MMT:  Eval: difficult to assess strength - patient mobility limited          03/25/24: improving mobility to assess LE strength   MMT Right eval Left eval Right  03/25/24 Left  03/25/24  Hip flexion 4- 4- 4 4  Hip extension      Hip abduction 3+ 3+ 4- 4-  Hip adduction      Hip internal rotation      Hip external rotation      Knee flexion 4 4- 4 4  Knee extension 4 4- 4 4  Ankle dorsiflexion      Ankle plantarflexion      Ankle inversion      Ankle eversion       (Blank rows = not tested)  LUMBAR SPECIAL TESTS:  Straight leg raise test: Negative and Slump test: Negative  FUNCTIONAL TESTS:  5 times sit to stand: unable to assess   GAIT: Distance walked: 40 feet  Assistive device utilized: Single point cane which was too high for patient  Level of assistance: Complete Independence Comments: antalgic gait; trunk, hips, knees flexed; decreased wt bearing R LE with stance to push off  04/03/24: ambulating without assistive device; continued antalgic gait with poor weight shift more to R; hips and knees flexed throughout gait cycle   [facility] Adult PT Treatment:                                                DATE: 04/03/2024 Therapeutic Exercise: NuStep L6 x 7 min Trunk rotation supine x 10  Neuromuscular re-ed: Supine hooklying hip abd green TB 3 sec x 10  R/L Bridges + hip abd iso with green TB distal thighs 2x10 Quad set --> small range SLR 3 sec x 10 R/L Small range SLR in ER 3 sec x 10 R/L Heel slides with orange physio ball x 10   Therapeutic Activity: Standing mini wall squat 5-10 sec x 5 reps Shoulder flexion green TB btn hands x 10  SASH supine green TB x 15 R/L x 10 x 2 sets  Dead bug x 10  Standing mini squats x 10  Heel raises x 10  Hip abduction standing leading with heel pause x 10 x 2 sets R/L  Sidelying clamshell green TB 3 sec x 10 R/L Sidelying reverse clamshell no resistance 3 sec x 10  R/L   [facility] Adult PT Treatment:                                                DATE: 03/28/2024 Therapeutic Exercise: NuStep L6 x 7 min Trunk rotation supine x 10  Neuromuscular re-ed: Supine hooklying hip abd green TB 3 sec x 10 R/L Bridges + hip abd  iso with green TB distal thighs 2x10 Quad set --> small range SLR 3 sec x 10 R/L Small range SLR in ER 3 sec x 10 R/L Heel slides with orange physio ball x 10   Therapeutic Activity: Standing mini wall squat 5-10 sec x 5 reps Shoulder flexion green TB btn hands x 10  SASH supine green TB x 15 R/L x 10 x 2 sets  Dead bug x 10  Standing mini squats x 10  Hip abduction standing leading with heel pause x 10 x 2 sets R/L  Sidelying clamshell green TB 3 sec x 10 R/L Sidelying reverse clamshell no resistance 3 sec x 10  R/L   [facility] Adult PT Treatment:                                                DATE: 03/25/2024 Therapeutic Exercise: NuStep L6 x 7 min Trunk rotation supine x 10  Neuromuscular re-ed: Supine hooklying isometric hip abd green TB 3 sec x 10 x 2 R/L Bridges + hip abd iso with green TB distal thighs 10 x 2 Quad set --> small range SLR 3 sec x 10 R/L Small range SLR in ER 3 sec x 10 R/L Table top bilat LEs hold 3 sec x 5 x 2 sets (one leg up and down at a time)   Therapeutic Activity: Standing mini wall squat 5-10 sec x 5 reps Shoulder flexion supine green TB btn hands x  10  SASH supine green TB R/L x 10 x 2 sets  Dead bug x 10 x 2 Standing mini squats x 10  Sidelying clamshell green TB 3 sec x 10 R/L Sidelying reverse clamshell no resistance 3 sec x 10  R/L   PATIENT EDUCATION:  Education details: Added resistance bands to clamshell variations Person educated: Patient Education method: Explanation, Demonstration, Tactile cues, Verbal cues, and Handouts Education comprehension: verbalized understanding, returned demonstration, verbal cues required, tactile cues required, and needs further education  HOME EXERCISE PROGRAM: Access Code: [email or web address] URL: [email or web address] Date: 03/28/2024 Prepared by: Shamicka Inga  Exercises - Supine March  - 2 x daily - 7 x weekly - 1-2 sets - 3 reps - 2 sec  hold - Supine Lower Trunk Rotation  - 2 x daily - 7 x weekly - 1 sets - 3-5 reps - 20-30 sec  hold - Sidelying Hip Abduction  - 1 x daily - 7 x weekly - 3 sets - 10 reps - 3-5 sec  hold - Clamshell  - 2 x daily - 7 x weekly - 1 sets - 10 reps - 2-3 sec  hold - Sidelying Reverse Clamshell  - 2 x daily - 7 x weekly - 1-2 sets - 10 reps - 2-3 sec  hold - Supine Diaphragmatic Breathing  - 2 x daily - 7 x weekly - 1 sets - 10 reps - 4-6 sec  hold - Supine Quad Set  - 2 x daily - 7 x weekly - 1 sets - 10 reps - 3 sec  hold - Small Range Straight Leg Raise  - 2 x daily - 7 x weekly - 1 sets - 10 reps - 5 sec  hold - Hooklying Hamstring Stretch with Strap  - 2 x daily - 7 x weekly - 1 sets - 3 reps - 30 sec  hold - Supine  Alternating Shoulder Flexion  - 2 x daily - 7 x weekly - 1-2 sets - 10 reps - 2 sec  hold - Dead Bug  - 1 x daily - 7 x weekly - 1-2 sets - 10 reps - 2 sec  hold - Seated Shoulder Diagonal Pulls with Resistance  - 1 x daily - 7 x weekly - 1-2 sets - 10 reps - 3 sec  hold - Wall Quarter Squat  - 2 x daily - 7 x weekly - 1-2 sets - 10 reps - 5-10 sec  hold - Standing Hip Abduction with Counter Support  - 1 x daily - 7 x weekly - 2-3 sets - 10  reps - 2-3 sec  hold  ASSESSMENT:  CLINICAL IMPRESSION: Patient reports continued stiffness and some pain. The pain in LB is improving. Continued pain in bilat knees R > L. Tolerated exercises well. Continued improvement with exercise tolerance with increasing resistance, reps, exercise difficulty.  Focus on core strengthening and stabilization. Patient has accomplished short term goals and some of long term goals. She is progressing gradually toward remaining goals of therapy and will benefit from continued treatment. Scheduled for aquatic therapy after vacation.    EVAL: Patient is a 63 y.o. female who was seen today for physical therapy evaluation and treatment for R sided LBP; chronic R knee pain. Patient reports that she fell back into her coffee tale after a nap 02/18/24 striking her R LB and posterior hip. She has had severe pain in the R back and hip since that time. She reports chronic R knee pain in the past 5 years. She has a history of L knee surgery ? Date and continues to have limited ROM and strength L LE. Patient has forward flexed posture and alignment; difficulty with all transfers and transitional movements; limited trunk and LE mobility/ROM; decreased core and LE strength; abnormal and unsafe gait pattern with cane that was too tall for her(adjusted today and pt was instructed in proper three point gait pattern). Patient has pain limiting all functional activities. She will benefit from PT  to address problems identified.    GOALS: Goals reviewed with patient? Yes  SHORT TERM GOALS: Target date: 03/27/2024  Independent in initial HEP  Baseline: Goal status: met  2.  Safe gait for home and short distances of 50-100 ft with appropriate assistive device  Baseline:  Goal status: met  3.  Decrease pain by 25-50% allowing patient to increase normal activities  Baseline:  Goal status: met    LONG TERM GOALS: Target date: 04/24/2024  Increase trunk ROM to 50-70% with minimal  to no pain  Baseline:  Goal status: on going   2.  Patient reports ability to sit for 30 min and stand for 10 min without increased pain  Baseline:  Goal status: met  3.  Increased ROM bilt knee extension to (-)10 degrees and flexion to 100 deg R and 90 deg L  Baseline:  Goal status: on going   4.  Increase strength to 4/5 to 4+/5 bilat LE's  Baseline:  Goal status: on going   5.  Independent in HEP including aquatic program as indicated  Baseline:  Goal status: on going   6.  Improve modified Owestry score by 10-15 points  Baseline: 37/50; 74%  04/03/24: 24/50; 48% Goal status: met   PLAN:  PT FREQUENCY: 2x/week  PT DURATION: 8 weeks  PLANNED INTERVENTIONS: 97110-Therapeutic exercises, 97530- Therapeutic activity, [id number]- Neuromuscular re-education, 97535- Self Care,  16109- Manual therapy, [id number]- Gait training, 60454- Aquatic Therapy, Patient/Family education, Balance training, Stair training, Taping, Dry Needling, and Joint mobilization.  PLAN FOR NEXT SESSION: review and progress exercises; continue with spine care and ergonomic education; core strengthening and stabilization; LE strengthening; manual work and modalities as indicated    Greycen Felter P Zarian Colpitts, PT 04/03/2024, 10:25 AM

## 2024-04-06 DIAGNOSIS — M5441 Lumbago with sciatica, right side: Secondary | ICD-10-CM | POA: Diagnosis not present

## 2024-04-06 DIAGNOSIS — M9903 Segmental and somatic dysfunction of lumbar region: Secondary | ICD-10-CM | POA: Diagnosis not present

## 2024-04-06 DIAGNOSIS — M25551 Pain in right hip: Secondary | ICD-10-CM | POA: Diagnosis not present

## 2024-04-06 DIAGNOSIS — M9905 Segmental and somatic dysfunction of pelvic region: Secondary | ICD-10-CM | POA: Diagnosis not present

## 2024-04-13 DIAGNOSIS — M9903 Segmental and somatic dysfunction of lumbar region: Secondary | ICD-10-CM | POA: Diagnosis not present

## 2024-04-15 ENCOUNTER — Ambulatory Visit

## 2024-04-15 ENCOUNTER — Other Ambulatory Visit (INDEPENDENT_AMBULATORY_CARE_PROVIDER_SITE_OTHER)

## 2024-04-15 ENCOUNTER — Ambulatory Visit: Admitting: Sports Medicine

## 2024-04-15 DIAGNOSIS — M170 Bilateral primary osteoarthritis of knee: Secondary | ICD-10-CM | POA: Diagnosis not present

## 2024-04-15 DIAGNOSIS — M5441 Lumbago with sciatica, right side: Secondary | ICD-10-CM | POA: Diagnosis not present

## 2024-04-15 DIAGNOSIS — M25562 Pain in left knee: Secondary | ICD-10-CM | POA: Diagnosis not present

## 2024-04-15 DIAGNOSIS — G8929 Other chronic pain: Secondary | ICD-10-CM | POA: Diagnosis not present

## 2024-04-15 DIAGNOSIS — M1712 Unilateral primary osteoarthritis, left knee: Secondary | ICD-10-CM | POA: Diagnosis not present

## 2024-04-15 MED ORDER — TRIAMCINOLONE ACETONIDE 40 MG/ML IJ SUSP
40.0000 mg | Freq: Once | INTRAMUSCULAR | Status: AC
Start: 2024-04-15 — End: 2024-04-15
  Administered 2024-04-15: 40 mg via INTRAMUSCULAR

## 2024-04-15 NOTE — Progress Notes (Signed)
    Procedures performed today:    Procedure: Real-time Ultrasound Guided injection of the right knee Device: Samsung HS60  Verbal informed consent obtained.  Time-out conducted.  Noted no overlying erythema, induration, or other signs of local infection.  Skin prepped in a sterile fashion.  Local anesthesia: Topical Ethyl chloride.  With sterile technique and under real time ultrasound guidance: Mild effusion noted, 1 cc Kenalog  40, 2 cc lidocaine, 2 cc bupivacaine injected easily Completed without difficulty  Advised to call if fevers/chills, erythema, induration, drainage, or persistent bleeding.  Images permanently stored and available for review in PACS.  Impression: Technically successful ultrasound guided injection.  Independent interpretation of notes and tests performed by another provider:   None.  Brief History, Exam, Impression, and Recommendations:    Chronic right-sided low back pain with right-sided sciatica Pleasant 63 year old female, chronic axial low back pain, right sided, intermittent radiculopathy. No red flag symptoms. She has been doing physical therapy and chiropractic manipulation and is improving. We will continue the course for now, she understands if the above fails after another 6 weeks and she does not plateau at a place where she can live with it we will proceed with MRI. Neurontin can also be used along the way.  Primary osteoarthritis of both knees Also with increasing right knee pain worse than left moderate gelling, tenderness medial joint line. Suspect osteoarthritis, physical therapy and oral NSAIDs have not been sufficiently efficacious, today we injected her knee, I would like updated x-rays, continue therapy. We can switch to Visco if not better.    ____________________________________________ Joselyn Nicely. Sandy Crumb, M.D., ABFM., CAQSM., AME. Primary Care and Sports Medicine [facility]  Adjunct Professor of  [facility]  [employer]  Restaurant manager, fast food

## 2024-04-15 NOTE — Addendum Note (Signed)
 Addended by: OLIVA-AVELLANEDA, Tanielle Emigh L on: 04/15/2024 10:44 AM   Modules accepted: Orders

## 2024-04-15 NOTE — Assessment & Plan Note (Signed)
 Also with increasing right knee pain worse than left moderate gelling, tenderness medial joint line. Suspect osteoarthritis, physical therapy and oral NSAIDs have not been sufficiently efficacious, today we injected her knee, I would like updated x-rays, continue therapy. We can switch to Visco if not better.

## 2024-04-15 NOTE — Assessment & Plan Note (Signed)
 Pleasant 63 year old female, chronic axial low back pain, right sided, intermittent radiculopathy. No red flag symptoms. She has been doing physical therapy and chiropractic manipulation and is improving. We will continue the course for now, she understands if the above fails after another 6 weeks and she does not plateau at a place where she can live with it we will proceed with MRI. Neurontin can also be used along the way.

## 2024-04-19 ENCOUNTER — Ambulatory Visit: Payer: Self-pay | Admitting: Sports Medicine

## 2024-04-19 ENCOUNTER — Ambulatory Visit (HOSPITAL_BASED_OUTPATIENT_CLINIC_OR_DEPARTMENT_OTHER): Admitting: Physical Therapy

## 2024-04-22 ENCOUNTER — Encounter (HOSPITAL_BASED_OUTPATIENT_CLINIC_OR_DEPARTMENT_OTHER): Payer: Self-pay | Admitting: Physical Therapy

## 2024-04-22 ENCOUNTER — Ambulatory Visit (HOSPITAL_BASED_OUTPATIENT_CLINIC_OR_DEPARTMENT_OTHER): Attending: Medical-Surgical | Admitting: Physical Therapy

## 2024-04-22 DIAGNOSIS — G8929 Other chronic pain: Secondary | ICD-10-CM | POA: Diagnosis not present

## 2024-04-22 DIAGNOSIS — M5459 Other low back pain: Secondary | ICD-10-CM | POA: Insufficient documentation

## 2024-04-22 DIAGNOSIS — M25561 Pain in right knee: Secondary | ICD-10-CM | POA: Diagnosis not present

## 2024-04-22 DIAGNOSIS — M6281 Muscle weakness (generalized): Secondary | ICD-10-CM | POA: Diagnosis not present

## 2024-04-22 DIAGNOSIS — R29898 Other symptoms and signs involving the musculoskeletal system: Secondary | ICD-10-CM | POA: Insufficient documentation

## 2024-04-22 NOTE — Therapy (Signed)
 OUTPATIENT PHYSICAL THERAPY THORACOLUMBAR TREATMENT   Patient Name: Carol Franklin MRN: [id number] DOB:11-25-1960, 63 y.o., female Today's Date: 04/22/2024  END OF SESSION:  PT End of Session - 04/22/24 1058     Visit Number 11    Number of Visits 16    Date for PT Re-Evaluation 04/24/24    Authorization Type [employer] federal plan copay $35    Authorization Time Period year - 50 visits per year - 37remaining    Authorization - Visit Number 11    Authorization - Number of Visits 37    PT Start Time 1100    PT Stop Time 1140    PT Time Calculation (min) 40 min    Activity Tolerance Patient tolerated treatment well    Behavior During Therapy WFL for tasks assessed/performed         Past Medical History:  Diagnosis Date   Abnormal EKG    BMI 35.0-35.9,adult 01/06/2012   Chronic pain of both knees 01/13/2020   Chronic right-sided low back pain with right-sided sciatica 10/21/2020   Eczema 02/14/2020   Essential thrombocytosis (HCC) 01/13/2012   Fatigue 10/21/2020   Hyperlipidemia 01/13/2012   Hypertension    Intertrigo    Loud snoring 10/21/2020   Menopausal hot flushes 09/17/2014   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/2012   Osteopenia 12/04/2015   Person encountering health services to consult on behalf of another person 03/03/2023   Post-operative nausea and vomiting    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   Weight gain 02/14/2020   Past Surgical History:  Procedure Laterality Date   CESAREAN SECTION     X2   DILATION AND CURETTAGE OF UTERUS     KNEE SURGERY     left   Patient Active Problem List   Diagnosis Date Noted   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   Post-operative nausea and vomiting    Intertrigo    Hypertension    Abnormal EKG    Person encountering health services to consult on behalf of another person 03/03/2023   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   Fatigue 10/21/2020   Chronic right-sided low back pain with right-sided sciatica 10/21/2020    Loud snoring 10/21/2020   Eczema 02/14/2020   Weight gain 02/14/2020   Primary osteoarthritis of both knees 01/13/2020   Osteopenia 12/04/2015   Menopausal hot flushes 09/17/2014   Essential thrombocytosis (HCC) 01/13/2012   Hyperlipidemia 01/13/2012   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/2012   BMI 35.0-35.9,adult 01/06/2012    PCP: Cherre Cornish, NP  REFERRING PROVIDER: Cherre Cornish, NP  REFERRING DIAG: R sided LBP; chronic R knee   Rationale for Evaluation and Treatment: Rehabilitation  THERAPY DIAG:  Other low back pain  Other symptoms and signs involving the musculoskeletal system  Chronic pain of right knee  Muscle weakness (generalized)  ONSET DATE: 02/18/24  SUBJECTIVE:  SUBJECTIVE STATEMENT: Pt reports she received an injection in R knee last week.  She reports she hasn't been in a pool in a long time.  She reports that her R knee buckled while she was walking at beach on [place].    EVAL: Patient reports that she was napping 02/18/24 on her sofa and awoke and stumbled backward hitting her R LB to R hip. She has noted some improvement but is still having pain the back and hip. She has a history R knee pain for the past 5 years. She has had some PT for the R knee but did not continue to exercise. She has gained ~ 20 pounds in the past year.   PERTINENT HISTORY:  Denies medical problems; L knee injury not on R   PAIN:  Are you having pain? Yes: NPRS scale: minor/10 Pain location: R LB to R posterior hip Pain description: sharp sometimes; dull; aching  Aggravating factors: standing > 5 min; walking > 3 min; sitting > 15 min Relieving factors: lying on L side; ice   Are you having pain? Yes: NPRS scale: 7/10  Pain location: inside area of the R/L knee and below the kneecap  Pain description:  sharp; achy  Aggravating factors: sleeping; moving on the knee - standing or walking  Relieving factors: ice   PRECAUTIONS: None  WEIGHT BEARING RESTRICTIONS: No  FALLS:  Has patient fallen in last 6 months? Yes. 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  DIAGNOSTIC FINDINGS:  Xray 02/26/24 - results not available   PATIENT SURVEYS:  Modified Oswestry 37/50; 74%  04/03/24: modified Oswestry 24/50; 48%  SENSATION: WFL  MUSCLE LENGTH: Hamstrings: Right 55 deg; Left 55 deg Thomas test: tight hip flexors   POSTURE: rounded shoulders, forward head, increased thoracic kyphosis, and flexed trunk   PALPATION: Tender and tight R posterior hip and lumbar paraspinals to lats - transverse abdominals   MOBILITY/TRANSFERS;  Patient with difficulty with all transitional movements sit to stand; stand to sit; sit to supine; rolling to either side; unable to assume prone position   LUMBAR ROM:   AROM eval 04/03/24  Flexion 30% hands on thighs pain R LB 75% Minimal pain twinge  Extension 20% painful  50% pull  Right lateral flexion 60% discomfort  90% pull   Left lateral flexion 55% discomfort  70% min pain  Right rotation 10% stiff/tight  60%  Left rotation 10% stiff/tight  50% min pain    (Blank rows = not tested)  LOWER EXTREMITY ROM:   limited throughout bilat LE's R > L     04/03/24: hip and knee ROM remain  limited  Active  Right eval Left eval  Hip flexion 100 95  Hip extension Unable to achieve neutral  Unable to achieve neutral  Hip abduction    Hip adduction    Hip internal rotation    Hip external rotation    Knee flexion 89 78  Knee extension -  22 -20  Ankle dorsiflexion    Ankle plantarflexion    Ankle inversion    Ankle eversion     (Blank rows = not tested)  LOWER EXTREMITY MMT:  Eval: difficult to assess strength - patient mobility limited          03/25/24: improving mobility to assess LE strength   MMT Right eval Left eval Right  03/25/24 Left  03/25/24  Hip flexion 4- 4- 4 4  Hip extension      Hip abduction 3+ 3+ 4- 4-  Hip adduction      Hip internal rotation      Hip external rotation      Knee flexion 4 4- 4 4  Knee extension 4 4- 4 4  Ankle dorsiflexion      Ankle plantarflexion      Ankle inversion      Ankle eversion       (Blank rows = not tested)  LUMBAR SPECIAL TESTS:  Straight leg raise test: Negative and Slump test: Negative  FUNCTIONAL TESTS:  5 times sit to stand: unable to assess   GAIT: Distance walked: 40 feet  Assistive device utilized: Single point cane which was too high for patient  Level of assistance: Complete Independence Comments: antalgic gait; trunk, hips, knees flexed; decreased wt bearing R LE with stance to push off  04/03/24: ambulating without assistive device; continued antalgic gait with poor weight shift more to R; hips and knees flexed throughout gait cycle    [facility] Adult PT Treatment:                                            04/22/24 Pt seen for aquatic therapy today.  Treatment took place in water 3.5-4.75 ft in depth at the [facility] pool. Temp of water was 91.  Pt entered/exited the pool via stairs independently with bilat rail in step-.  - Intro to aquatic therapy principles - UE on yellow hand floats walking forward/ backward - UE on yellow hand floats: side stepping -> with arm addct (no floats) ->  with rainbow hand floats and abdct/ addct - UE on wall:  toe/heel raises x20; hip add/abd x 10 ; hip flexion /extension x 10; relaxed squat for decompression - seated on bench in water:  LAQ with DF, cycling LEs, hip abdct/addct - return to walking forward/backward without UE support   Pt requires the buoyancy and hydrostatic pressure of water for support, and to offload joints by unweighting joint load by at least 50 % in navel deep water and by at least 75-80% in chest to neck deep water.  Viscosity of the water is needed for resistance of strengthening. Water current perturbations provides challenge to standing balance requiring increased core activation.    [facility] Adult PT Treatment:                                                DATE: 04/03/2024 Therapeutic Exercise: NuStep L6 x 7 min Trunk rotation supine x 10  Neuromuscular re-ed: Supine hooklying hip abd green TB 3 sec x 10 R/L Bridges + hip abd iso with green TB distal thighs 2x10 Quad set --> small range SLR 3 sec x 10 R/L Small range  SLR in ER 3 sec x 10 R/L Heel slides with orange physio ball x 10   Therapeutic Activity: Standing mini wall squat 5-10 sec x 5 reps Shoulder flexion green TB btn hands x 10  SASH supine green TB x 15 R/L x 10 x 2 sets  Dead bug x 10  Standing mini squats x 10  Heel raises x 10  Hip abduction standing leading with heel pause x 10 x 2 sets R/L  Sidelying clamshell green TB 3 sec x 10 R/L Sidelying reverse clamshell no resistance 3 sec x 10  R/L   [facility] Adult PT Treatment:                                                DATE: 03/28/2024 Therapeutic Exercise: NuStep L6 x 7 min Trunk rotation supine x 10  Neuromuscular re-ed: Supine hooklying hip abd green TB 3 sec x 10 R/L Bridges + hip abd iso with green TB distal thighs 2x10 Quad set --> small range SLR 3 sec x 10 R/L Small range SLR in ER 3 sec x 10 R/L Heel slides with orange physio ball x 10   Therapeutic Activity: Standing mini wall squat  5-10 sec x 5 reps Shoulder flexion green TB btn hands x 10  SASH supine green TB x 15 R/L x 10 x 2 sets  Dead bug x 10  Standing mini squats x 10  Hip abduction standing leading with heel pause x 10 x 2 sets R/L  Sidelying clamshell green TB 3 sec x 10 R/L Sidelying reverse clamshell no resistance 3 sec x 10  R/L   [facility] Adult PT Treatment:                                                DATE: 03/25/2024 Therapeutic Exercise: NuStep L6 x 7 min Trunk rotation supine x 10  Neuromuscular re-ed: Supine hooklying isometric hip abd green TB 3 sec x 10 x 2 R/L Bridges + hip abd iso with green TB distal thighs 10 x 2 Quad set --> small range SLR 3 sec x 10 R/L Small range SLR in ER 3 sec x 10 R/L Table top bilat LEs hold 3 sec x 5 x 2 sets (one leg up and down at a time)   Therapeutic Activity: Standing mini wall squat 5-10 sec x 5 reps Shoulder flexion supine green TB btn hands x 10  SASH supine green TB R/L x 10 x 2 sets  Dead bug x 10 x 2 Standing mini squats x 10  Sidelying clamshell green TB 3 sec x 10 R/L Sidelying reverse clamshell no resistance 3 sec x 10  R/L   PATIENT EDUCATION:  Education details: intro to aquatic therapy Person educated: Patient Education method: Explanation, Demonstration, Tactile cues, Verbal cues Education comprehension: verbalized understanding, returned demonstration, verbal cues required, tactile cues required, and needs further education  HOME EXERCISE PROGRAM: Access Code: [email or web address] URL: [email or web address] Date: 03/28/2024 Prepared by: Celyn Holt  Exercises - Supine March  - 2 x daily - 7 x weekly - 1-2 sets - 3 reps - 2 sec  hold - Supine Lower Trunk Rotation  - 2 x daily - 7 x weekly -  1 sets - 3-5 reps - 20-30 sec  hold - Sidelying Hip Abduction  - 1 x daily - 7 x weekly - 3 sets - 10 reps - 3-5 sec  hold - Clamshell  - 2 x daily - 7 x weekly - 1 sets - 10 reps - 2-3 sec  hold - Sidelying Reverse Clamshell  - 2 x daily - 7 x  weekly - 1-2 sets - 10 reps - 2-3 sec  hold - Supine Diaphragmatic Breathing  - 2 x daily - 7 x weekly - 1 sets - 10 reps - 4-6 sec  hold - Supine Quad Set  - 2 x daily - 7 x weekly - 1 sets - 10 reps - 3 sec  hold - Small Range Straight Leg Raise  - 2 x daily - 7 x weekly - 1 sets - 10 reps - 5 sec  hold - Hooklying Hamstring Stretch with Strap  - 2 x daily - 7 x weekly - 1 sets - 3 reps - 30 sec  hold - Supine Alternating Shoulder Flexion  - 2 x daily - 7 x weekly - 1-2 sets - 10 reps - 2 sec  hold - Dead Bug  - 1 x daily - 7 x weekly - 1-2 sets - 10 reps - 2 sec  hold - Seated Shoulder Diagonal Pulls with Resistance  - 1 x daily - 7 x weekly - 1-2 sets - 10 reps - 3 sec  hold - Wall Quarter Squat  - 2 x daily - 7 x weekly - 1-2 sets - 10 reps - 5-10 sec  hold - Standing Hip Abduction with Counter Support  - 1 x daily - 7 x weekly - 2-3 sets - 10 reps - 2-3 sec  hold  ASSESSMENT:  CLINICAL IMPRESSION: Pt demonstrates safety and independence in aquatic setting with therapist instructing from deck. Pt required UE supported on floatation device (hand float/noodle) initially, but was able to progress to no UE support without LOB.   Pt is directed through various movement patterns and trials in both sitting and standing positions. No increase in pain during session.   Goals are ongoing. Will plan to create aquatic HEP in future session with intent of her utilizing it at local pool.   Therapist to assess goals for recert next session; end of POC.    EVAL: Patient is a 63 y.o. female who was seen today for physical therapy evaluation and treatment for R sided LBP; chronic R knee pain. Patient reports that she fell back into her coffee tale after a nap 02/18/24 striking her R LB and posterior hip. She has had severe pain in the R back and hip since that time. She reports chronic R knee pain in the past 5 years. She has a history of L knee surgery ? Date and continues to have limited ROM and strength L LE.  Patient has forward flexed posture and alignment; difficulty with all transfers and transitional movements; limited trunk and LE mobility/ROM; decreased core and LE strength; abnormal and unsafe gait pattern with cane that was too tall for her(adjusted today and pt was instructed in proper three point gait pattern). Patient has pain limiting all functional activities. She will benefit from PT  to address problems identified.    GOALS: Goals reviewed with patient? Yes  SHORT TERM GOALS: Target date: 03/27/2024  Independent in initial HEP  Baseline: Goal status: met  2.  Safe gait for home and short  distances of 50-100 ft with appropriate assistive device  Baseline:  Goal status: met  3.  Decrease pain by 25-50% allowing patient to increase normal activities  Baseline:  Goal status: met    LONG TERM GOALS: Target date: 04/24/2024  Increase trunk ROM to 50-70% with minimal to no pain  Baseline:  Goal status: on going   2.  Patient reports ability to sit for 30 min and stand for 10 min without increased pain  Baseline:  Goal status: met  3.  Increased ROM bilt knee extension to (-)10 degrees and flexion to 100 deg R and 90 deg L  Baseline:  Goal status: on going   4.  Increase strength to 4/5 to 4+/5 bilat LE's  Baseline:  Goal status: on going   5.  Independent in HEP including aquatic program as indicated  Baseline:  Goal status: on going   6.  Improve modified Owestry score by 10-15 points  Baseline: 37/50; 74%  04/03/24: 24/50; 48% Goal status: met   PLAN:  PT FREQUENCY: 2x/week  PT DURATION: 8 weeks  PLANNED INTERVENTIONS: 97110-Therapeutic exercises, 97530- Therapeutic activity, 97112- Neuromuscular re-education, 97535- Self Care, 91478- Manual therapy, [phone]- Gait training, [phone]- Aquatic Therapy, Patient/Family education, Balance training, Stair training, Taping, Dry Needling, and Joint mobilization.  PLAN FOR NEXT SESSION: review and progress exercises;  continue with spine care and ergonomic education; core strengthening and stabilization; LE strengthening; manual work and modalities as indicated    Almedia Jacobsen, PTA 04/22/24 12:46 PM [facility] [address] [place], [place], [address] Phone: [phone]   Fax:  [phone]

## 2024-04-23 ENCOUNTER — Ambulatory Visit: Attending: Medical-Surgical | Admitting: Rehabilitative and Restorative Service Providers"

## 2024-04-23 ENCOUNTER — Encounter: Payer: Self-pay | Admitting: Rehabilitative and Restorative Service Providers"

## 2024-04-23 DIAGNOSIS — M25561 Pain in right knee: Secondary | ICD-10-CM | POA: Insufficient documentation

## 2024-04-23 DIAGNOSIS — M6281 Muscle weakness (generalized): Secondary | ICD-10-CM | POA: Diagnosis not present

## 2024-04-23 DIAGNOSIS — R2689 Other abnormalities of gait and mobility: Secondary | ICD-10-CM | POA: Insufficient documentation

## 2024-04-23 DIAGNOSIS — M5459 Other low back pain: Secondary | ICD-10-CM | POA: Diagnosis not present

## 2024-04-23 DIAGNOSIS — R29898 Other symptoms and signs involving the musculoskeletal system: Secondary | ICD-10-CM | POA: Insufficient documentation

## 2024-04-23 DIAGNOSIS — G8929 Other chronic pain: Secondary | ICD-10-CM | POA: Diagnosis not present

## 2024-04-23 NOTE — Therapy (Signed)
 OUTPATIENT PHYSICAL THERAPY THORACOLUMBAR TREATMENT   Patient Name: Carol Franklin MRN: [id number] DOB:[birth year], 63 y.o., female Today's Date: 04/23/2024  END OF SESSION:  PT End of Session - 04/23/24 0940     Visit Number 12    Number of Visits 24    Date for PT Re-Evaluation 06/04/24    Authorization Type [employer] federal plan copay $35    Authorization Time Period year - 50 visits per year - 37remaining    Authorization - Visit Number 12    Authorization - Number of Visits 25    PT Start Time [phone]   pt late for appt   PT Stop Time 1015    PT Time Calculation (min) 39 min    Activity Tolerance Patient tolerated treatment well         Past Medical History:  Diagnosis Date   Abnormal EKG    BMI 35.0-35.9,adult 01/06/2012   Chronic pain of both knees 01/13/2020   Chronic right-sided low back pain with right-sided sciatica 10/21/2020   Eczema 02/14/2020   Essential thrombocytosis (HCC) 01/13/2012   Fatigue 10/21/2020   Hyperlipidemia 01/13/2012   Hypertension    Intertrigo    Loud snoring 10/21/2020   Menopausal hot flushes 09/17/2014   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/2012   Osteopenia 12/04/2015   Person encountering health services to consult on behalf of another person 03/03/2023   Post-operative nausea and vomiting    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   Weight gain 02/14/2020   Past Surgical History:  Procedure Laterality Date   CESAREAN SECTION     X2   DILATION AND CURETTAGE OF UTERUS     KNEE SURGERY     left   Patient Active Problem List   Diagnosis Date Noted   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   Post-operative nausea and vomiting    Intertrigo    Hypertension    Abnormal EKG    Person encountering health services to consult on behalf of another person 03/03/2023   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   Fatigue 10/21/2020   Chronic right-sided low back pain with right-sided sciatica 10/21/2020   Loud snoring 10/21/2020   Eczema  02/14/2020   Weight gain 02/14/2020   Primary osteoarthritis of both knees 01/13/2020   Osteopenia 12/04/2015   Menopausal hot flushes 09/17/2014   Essential thrombocytosis (HCC) 01/13/2012   Hyperlipidemia 01/13/2012   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/2012   BMI 35.0-35.9,adult 01/06/2012    PCP: Cherre Cornish, NP  REFERRING PROVIDER: Cherre Cornish, NP  REFERRING DIAG: R sided LBP; chronic R knee   Rationale for Evaluation and Treatment: Rehabilitation  THERAPY DIAG:  Other low back pain  Other symptoms and signs involving the musculoskeletal system  Chronic pain of right knee  Muscle weakness (generalized)  Other abnormalities of gait and mobility  ONSET DATE: 02/18/24  SUBJECTIVE:  SUBJECTIVE STATEMENT: Patient reports that she is getting stronger. LBP is decreased but she will have some flare up when she is standing > 15 min. She has continued pain in the R knee. She had an injection in R knee last week and that has helped with the knee pain and walking. She can feel she is improving. Working on exercises some at home. Water therapy yesterday was good. She notices soreness form aquatic exercises.   EVAL: Patient reports that she was napping 02/18/24 on her sofa and awoke and stumbled backward hitting her R LB to R hip. She has noted some improvement but is still having pain the back and hip. She has a history R knee pain for the past 5 years. She has had some PT for the R knee but did not continue to exercise. She has gained ~ 20 pounds in the past year.   PERTINENT HISTORY:  Denies medical problems; L knee injury not on R   PAIN:  Are you having pain? Yes: NPRS scale: 3/10 in morning; better as the day gets started now 0/10 Pain location: R LB to R posterior hip; now pain is on the L LB area   Pain  description: sharp sometimes; dull; aching  Aggravating factors: standing > 5 min; walking > 3 min; sitting > 15 min Relieving factors: lying on L side; ice   Are you having pain? Yes: NPRS scale: 5/10  Pain location: inside area of the R knee and below the kneecap  Pain description: sharp; achy  Aggravating factors: sleeping; moving on the knee - standing or walking  Relieving factors: ice   PRECAUTIONS: None  WEIGHT BEARING RESTRICTIONS: No  FALLS:  Has patient fallen in last 6 months? Yes. 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  DIAGNOSTIC FINDINGS:  Xray 02/26/24 - results not available   PATIENT SURVEYS:  Modified Oswestry 37/50; 74%  04/03/24: modified Oswestry 24/50; 48%  SENSATION: WFL  MUSCLE LENGTH: Hamstrings: Right 55 deg; Left 55 deg Thomas test: tight hip flexors   POSTURE: rounded shoulders, forward head, increased thoracic kyphosis, and flexed trunk   PALPATION: Tender and tight R posterior hip and lumbar paraspinals to lats - transverse abdominals   MOBILITY/TRANSFERS;  Patient with difficulty with all transitional movements sit to stand; stand to sit; sit to supine; rolling to either side; unable to assume prone position   LUMBAR ROM:   AROM eval 04/03/24 04/23/24  Flexion 30% hands on thighs pain R LB 75% Minimal pain twinge 80%  Extension 20% painful  50% pull 50% pull   Right  lateral flexion 60% discomfort  90% pull  90%  Left lateral flexion 55% discomfort  70% min pain 75% mild pain L LB  Right rotation 10% stiff/tight  60% 60%  Left rotation 10% stiff/tight  50% min pain  50% mild pain L LB   (Blank rows = not tested)  LOWER EXTREMITY ROM:   limited throughout  bilat LE's R > L     04/03/24: hip and knee ROM remain limited  Active  Right eval Left eval Right 04/23/24 Left  04/23/24  Hip flexion 100 95 106 100  Hip extension Unable to achieve neutral  Unable to achieve neutral Neutral  ~ (-) 10 deg   Hip abduction      Hip adduction      Hip internal rotation      Hip external rotation      Knee flexion -22 -20 -19 -9  Knee extension 89 78 95 78  Ankle dorsiflexion      Ankle plantarflexion      Ankle inversion      Ankle eversion       (Blank rows = not tested)  LOWER EXTREMITY MMT:  Eval: difficult to assess strength - patient mobility limited          03/25/24: improving mobility to assess LE strength   MMT Right eval Left eval Right  03/25/24 Left  03/25/24 Right 04/23/24  Left  04/23/24   Hip flexion 4- 4- 4 4 4  4+  Hip extension        Hip abduction 3+ 3+ 4- 4- 4- 4  Hip adduction        Hip internal rotation        Hip external rotation        Knee flexion 4 4- 4 4 4+ 4+  Knee extension 4 4- 4 4 4+ 4+  Ankle dorsiflexion        Ankle plantarflexion        Ankle inversion        Ankle eversion         (Blank rows = not tested)  LUMBAR SPECIAL TESTS:  Straight leg raise test: Negative and Slump test: Negative  FUNCTIONAL TESTS:  EVAL: 5 times sit to stand: unable to assess  04/23/24:  5 times sit to stand: 25.48 sec no use of UE's SLS R 10 sec; L 5 sec   GAIT: Distance walked: 40 feet  Assistive device utilized: Single point cane which was too high for patient  Level of assistance: Complete [place] Comments: antalgic gait; trunk, hips, knees flexed; decreased wt bearing R LE with stance to push off  04/03/24: ambulating  without assistive device; continued antalgic gait with poor weight shift more to R; hips and knees flexed throughout gait cycle    [facility] Adult PT Treatment:                                                DATE: 04/23/2024 Re-assessment for recert  Therapeutic Exercise: Trunk rotation supine x 10  Neuromuscular re-ed: Bridges + hip abd iso with blue TB distal thighs 2x10 Quad set --> small range SLR 3 sec x 10 R/L Small range SLR in ER 3 sec x 10 R/L Therapeutic Activity: Sit to stand x 5  Shoulder flexion blue TB btn hands x 10  SASH supine blue TB x 15 R/L x 10 x 2 sets  Dead bug x 10  Standing mini squats x 10  Heel raises x 10  Hip abduction standing leading with heel pause x 10 x 2 sets R/L  Sidelying clamshell blue TB 3 sec x 10 R/L Sidelying reverse clamshell no resistance 3 sec x 10  R/L  [facility] Adult  PT Treatment:                                                DATE: 04/03/2024 Therapeutic Exercise: NuStep L6 x 7 min Trunk rotation supine x 10  Neuromuscular re-ed: Supine hooklying hip abd green TB 3 sec x 10 R/L Bridges + hip abd iso with green TB distal thighs 2x10 Quad set --> small range SLR 3 sec x 10 R/L Small range SLR in ER 3 sec x 10 R/L Heel slides with orange physio ball x 10   Therapeutic Activity: Standing mini wall squat 5-10 sec x 5 reps Shoulder flexion green TB btn hands x 10  SASH supine green TB x 15 R/L x 10 x 2 sets  Dead bug x 10  Standing mini squats x 10  Heel raises x 10  Hip abduction standing leading with heel pause x 10 x 2 sets R/L  Sidelying clamshell green TB 3 sec x 10 R/L Sidelying reverse clamshell no resistance 3 sec x 10  R/L   PATIENT EDUCATION:  Education details: Added resistance bands to clamshell variations Person educated: Patient Education method: Explanation, Demonstration, Tactile cues, Verbal cues, and Handouts Education comprehension: verbalized understanding, returned demonstration, verbal cues required, tactile cues  required, and needs further education  HOME EXERCISE PROGRAM: Access Code: [email or web address] URL: [email or web address] Date: 03/28/2024 Prepared by: Chrisangel Eskenazi  Exercises - Supine March  - 2 x daily - 7 x weekly - 1-2 sets - 3 reps - 2 sec  hold - Supine Lower Trunk Rotation  - 2 x daily - 7 x weekly - 1 sets - 3-5 reps - 20-30 sec  hold - Sidelying Hip Abduction  - 1 x daily - 7 x weekly - 3 sets - 10 reps - 3-5 sec  hold - Clamshell  - 2 x daily - 7 x weekly - 1 sets - 10 reps - 2-3 sec  hold - Sidelying Reverse Clamshell  - 2 x daily - 7 x weekly - 1-2 sets - 10 reps - 2-3 sec  hold - Supine Diaphragmatic Breathing  - 2 x daily - 7 x weekly - 1 sets - 10 reps - 4-6 sec  hold - Supine Quad Set  - 2 x daily - 7 x weekly - 1 sets - 10 reps - 3 sec  hold - Small Range Straight Leg Raise  - 2 x daily - 7 x weekly - 1 sets - 10 reps - 5 sec  hold - Hooklying Hamstring Stretch with Strap  - 2 x daily - 7 x weekly - 1 sets - 3 reps - 30 sec  hold - Supine Alternating Shoulder Flexion  - 2 x daily - 7 x weekly - 1-2 sets - 10 reps - 2 sec  hold - Dead Bug  - 1 x daily - 7 x weekly - 1-2 sets - 10 reps - 2 sec  hold - Seated Shoulder Diagonal Pulls with Resistance  - 1 x daily - 7 x weekly - 1-2 sets - 10 reps - 3 sec  hold - Wall Quarter Squat  - 2 x daily - 7 x weekly - 1-2 sets - 10 reps - 5-10 sec  hold - Standing Hip Abduction with Counter Support  - 1 x daily - 7 x weekly - 2-3 sets - 10  reps - 2-3 sec  hold  ASSESSMENT:  CLINICAL IMPRESSION: Patient reports continued stiffness and some pain. The pain in LB is improving. Continued pain in bilat knees R > L. Strength in core and LE's is increasing. Continued improvement with exercise tolerance with increasing resistance, reps, exercise difficulty.  Focus on core strengthening and stabilization. Patient has accomplished short term goals and continues to work toward long term goals. She is progressing toward remaining goals of therapy  and will benefit from continued treatment. Scheduled for additional aquatic therapy to progress to independent community based program.    EVAL: Patient is a 63 y.o. female who was seen today for physical therapy evaluation and treatment for R sided LBP; chronic R knee pain. Patient reports that she fell back into her coffee tale after a nap 02/18/24 striking her R LB and posterior hip. She has had severe pain in the R back and hip since that time. She reports chronic R knee pain in the past 5 years. She has a history of L knee surgery ? Date and continues to have limited ROM and strength L LE. Patient has forward flexed posture and alignment; difficulty with all transfers and transitional movements; limited trunk and LE mobility/ROM; decreased core and LE strength; abnormal and unsafe gait pattern with cane that was too tall for her(adjusted today and pt was instructed in proper three point gait pattern). Patient has pain limiting all functional activities. She will benefit from PT  to address problems identified.    GOALS: Goals reviewed with patient? Yes  SHORT TERM GOALS: Target date: 03/27/2024  Independent in initial HEP  Baseline: Goal status: met  2.  Safe gait for home and short distances of 50-100 ft with appropriate assistive device  Baseline:  Goal status: met  3.  Decrease pain by 25-50% allowing patient to increase normal activities  Baseline:  Goal status: met    LONG TERM GOALS: Target date: 04/24/2024  Increase trunk ROM to 50-70% with minimal to no pain  Baseline:  Goal status: on going   2.  Patient reports ability to sit for 30 min and stand for 10 min without increased pain  Baseline:  Goal status: met  3.  Increased ROM bilt knee extension to (-)10 degrees and flexion to 100 deg R and 90 deg L  Baseline:  Goal status: on going   4.  Increase strength to 4/5 to 4+/5 bilat LE's  Baseline:  Goal status: partially met   5.  Independent in HEP including aquatic  program as indicated  Baseline:  Goal status: on going   6.  Improve modified Owestry score by 10-15 points  Baseline: 37/50; 74%  04/03/24: 24/50; 48% Goal status: met   PLAN:  PT FREQUENCY: 2x/week  PT DURATION: 8 weeks  PLANNED INTERVENTIONS: 97110-Therapeutic exercises, 97530- Therapeutic activity, 97112- Neuromuscular re-education, 97535- Self Care, [id number]- Manual therapy, [phone]- Gait training, [phone]- Aquatic Therapy, Patient/Family education, Balance training, Stair training, Taping, Dry Needling, and Joint mobilization.  PLAN FOR NEXT SESSION: review and progress exercises; continue with spine care and ergonomic education; core strengthening and stabilization; LE strengthening; manual work and modalities as indicated    Aidric Endicott P Delayna Sparlin, PT 04/23/2024, 9:42 AM

## 2024-04-26 ENCOUNTER — Ambulatory Visit (HOSPITAL_BASED_OUTPATIENT_CLINIC_OR_DEPARTMENT_OTHER): Admitting: Physical Therapy

## 2024-04-26 ENCOUNTER — Encounter (HOSPITAL_BASED_OUTPATIENT_CLINIC_OR_DEPARTMENT_OTHER): Payer: Self-pay | Admitting: Physical Therapy

## 2024-04-26 DIAGNOSIS — R29898 Other symptoms and signs involving the musculoskeletal system: Secondary | ICD-10-CM | POA: Diagnosis not present

## 2024-04-26 DIAGNOSIS — G8929 Other chronic pain: Secondary | ICD-10-CM

## 2024-04-26 DIAGNOSIS — M6281 Muscle weakness (generalized): Secondary | ICD-10-CM | POA: Diagnosis not present

## 2024-04-26 DIAGNOSIS — M25561 Pain in right knee: Secondary | ICD-10-CM | POA: Diagnosis not present

## 2024-04-26 DIAGNOSIS — M5459 Other low back pain: Secondary | ICD-10-CM

## 2024-04-26 NOTE — Therapy (Signed)
 OUTPATIENT PHYSICAL THERAPY THORACOLUMBAR TREATMENT   Patient Name: Carol Franklin MRN: [id number] DOB:03-29-1961, 63 y.o., female Today's Date: 04/26/2024  END OF SESSION:  PT End of Session - 04/26/24 1210     Visit Number 13    Number of Visits 24    Date for PT Re-Evaluation 06/04/24    Authorization Type [employer] federal plan copay $[id number]    Authorization - Visit Number 13    Authorization - Number of Visits 37    PT Start Time 1103    PT Stop Time 1142    PT Time Calculation (min) 39 min    Activity Tolerance Patient tolerated treatment well    Behavior During Therapy WFL for tasks assessed/performed          Past Medical History:  Diagnosis Date   Abnormal EKG    BMI 35.0-35.9,adult 01/06/2012   Chronic pain of both knees 01/13/2020   Chronic right-sided low back pain with right-sided sciatica 10/21/2020   Eczema 02/14/2020   Essential thrombocytosis (HCC) 01/13/2012   Fatigue 10/21/2020   Hyperlipidemia 01/13/2012   Hypertension    Intertrigo    Loud snoring 10/21/2020   Menopausal hot flushes 09/17/2014   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/2012   Osteopenia 12/04/2015   Person encountering health services to consult on behalf of another person 03/03/2023   Post-operative nausea and vomiting    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   Weight gain 02/14/2020   Past Surgical History:  Procedure Laterality Date   CESAREAN SECTION     X2   DILATION AND CURETTAGE OF UTERUS     KNEE SURGERY     left   Patient Active Problem List   Diagnosis Date Noted   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   Post-operative nausea and vomiting    Intertrigo    Hypertension    Abnormal EKG    Person encountering health services to consult on behalf of another person 03/03/2023   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   Fatigue 10/21/2020   Chronic right-sided low back pain with right-sided sciatica 10/21/2020   Loud snoring 10/21/2020   Eczema 02/14/2020   Weight gain  02/14/2020   Primary osteoarthritis of both knees 01/13/2020   Osteopenia 12/04/2015   Menopausal hot flushes 09/17/2014   Essential thrombocytosis (HCC) 01/13/2012   Hyperlipidemia 01/13/2012   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/2012   BMI 35.0-35.9,adult 01/06/2012    PCP: Cherre Cornish, NP  REFERRING PROVIDER: Cherre Cornish, NP  REFERRING DIAG: R sided LBP; chronic R knee   Rationale for Evaluation and Treatment: Rehabilitation  THERAPY DIAG:  Other low back pain  Other symptoms and signs involving the musculoskeletal system  Chronic pain of right knee  Muscle weakness (generalized)  ONSET DATE: 02/18/24  SUBJECTIVE:  SUBJECTIVE STATEMENT: Patient reports that she was sore after initial aquatic therapy session.  POOL ACCESS:   EVAL: Patient reports that she was napping 02/18/24 on her sofa and awoke and stumbled backward hitting her R LB to R hip. She has noted some improvement but is still having pain the back and hip. She has a history R knee pain for the past 5 years. She has had some PT for the R knee but did not continue to exercise. She has gained ~ 20 pounds in the past year.   PERTINENT HISTORY:  Denies medical problems; L knee injury not on R   PAIN:  Are you having pain? Yes: NPRS scale: 3/10 in morning;  Pain location: R LB to R posterior hip Pain description: s dull; aching  Aggravating factors: standing > 5 min; walking > 3 min; sitting > 15 min Relieving factors: lying on L side; ice    PRECAUTIONS: None  WEIGHT BEARING RESTRICTIONS: No  FALLS:  Has patient fallen in last 6 months? Yes. 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  DIAGNOSTIC FINDINGS:  Xray 02/26/24 - results not available   PATIENT SURVEYS:  Modified Oswestry 37/50; 74%  04/03/24: modified Oswestry 24/50; 48%  SENSATION: WFL  MUSCLE LENGTH: Hamstrings: Right 55 deg; Left 55 deg Thomas test: tight hip flexors   POSTURE: rounded shoulders, forward head, increased thoracic kyphosis, and flexed trunk   PALPATION: Tender and tight R posterior hip and lumbar paraspinals to lats - transverse abdominals   MOBILITY/TRANSFERS;  Patient with difficulty with all transitional movements sit to stand; stand to sit; sit to supine; rolling to either side; unable to assume prone position   LUMBAR ROM:   AROM eval 04/03/24 04/23/24  Flexion 30% hands on thighs pain R LB 75% Minimal pain twinge 80%  Extension 20% painful  50% pull 50% pull   Right lateral flexion 60% discomfort  90% pull  90%  Left lateral flexion 55% discomfort  70% min pain 75% mild pain L LB  Right rotation 10% stiff/tight  60% 60%  Left rotation 10% stiff/tight  50% min pain  50% mild pain L LB   (Blank rows = not tested)  LOWER EXTREMITY ROM:   limited throughout bilat LE's R > L     04/03/24: hip and knee ROM remain limited  Active  Right eval Left eval Right 04/23/24 Left  04/23/24  Hip flexion 100 95 106 100  Hip extension Unable to achieve neutral  Unable to achieve neutral Neutral  ~ (-) 10 deg   Hip abduction      Hip adduction      Hip internal rotation      Hip external rotation      Knee flexion -22 -20 -19 -9  Knee extension 89 78 95 78  Ankle  dorsiflexion      Ankle plantarflexion      Ankle inversion      Ankle  eversion       (Blank rows = not tested)  LOWER EXTREMITY MMT:  Eval: difficult to assess strength - patient mobility limited          03/25/24: improving mobility to assess LE strength   MMT Right eval Left eval Right  03/25/24 Left  03/25/24 Right 04/23/24  Left  04/23/24   Hip flexion 4- 4- 4 4 4  4+  Hip extension        Hip abduction 3+ 3+ 4- 4- 4- 4  Hip adduction        Hip internal rotation        Hip external rotation        Knee flexion 4 4- 4 4 4+ 4+  Knee extension 4 4- 4 4 4+ 4+  Ankle dorsiflexion        Ankle plantarflexion        Ankle inversion        Ankle eversion         (Blank rows = not tested)  LUMBAR SPECIAL TESTS:  Straight leg raise test: Negative and Slump test: Negative  FUNCTIONAL TESTS:  EVAL: 5 times sit to stand: unable to assess  04/23/24:  5 times sit to stand: 25.48 sec no use of UE's SLS R 10 sec; L 5 sec   GAIT: Distance walked: 40 feet  Assistive device utilized: Single point cane which was too high for patient  Level of assistance: Complete Independence Comments: antalgic gait; trunk, hips, knees flexed; decreased wt bearing R LE with stance to push off  04/03/24: ambulating without assistive device; continued antalgic gait with poor weight shift more to R; hips and knees flexed throughout gait cycle   [facility] Adult PT Treatment:                                            04/26/24 Pt seen for aquatic therapy today.  Treatment took place in water 3.5-4.75 ft in depth at the [facility] pool. Temp of water was 91.  Pt entered/exited the pool via stairs independently with bilat rail in step-to and step-through pattern.   - UE on yellow hand floats walking forward/ backward, multiple laps - UE on yellow hand floats: side stepping 2 laps - UE on yellow hand floats:  toe/heel raises x20; hip add/abd 2x 10 ; hip flexion /extension 2 x 10 (legs fatigued); marching  in place - TrA set with single rainbow hand float pull down to thighs x 2-> moved to 1/2 hollow noodle for improved tolerance -> alternating knee taps to noodle - staggered stance: horiz abdct/ addct with rainbow hand floats on top of water x 10 each; kick board row x 10 each;  - straddling noodle and holding corner, gentle cycling for decompression of spine/LE   [facility] Adult PT Treatment:                                                DATE: 04/23/2024 Re-assessment for recert  Therapeutic Exercise: Trunk rotation supine x 10  Neuromuscular re-ed: Bridges + hip abd iso with blue TB distal thighs 2x10 Quad set --> small range SLR 3 sec x 10 R/L Small range SLR in ER 3 sec x 10 R/L  Therapeutic Activity: Sit to stand x 5  Shoulder flexion blue TB btn hands x 10  SASH supine blue TB x 15 R/L x 10 x 2 sets  Dead bug x 10  Standing mini squats x 10  Heel raises x 10  Hip abduction standing leading with heel pause x 10 x 2 sets R/L  Sidelying clamshell blue TB 3 sec x 10 R/L Sidelying reverse clamshell no resistance 3 sec x 10  R/L  [facility] Adult PT Treatment:                                                DATE: 04/03/2024 Therapeutic Exercise: NuStep L6 x 7 min Trunk rotation supine x 10  Neuromuscular re-ed: Supine hooklying hip abd green TB 3 sec x 10 R/L Bridges + hip abd iso with green TB distal thighs 2x10 Quad set --> small range SLR 3 sec x 10 R/L Small range SLR in ER 3 sec x 10 R/L Heel slides with orange physio ball x 10   Therapeutic Activity: Standing mini wall squat 5-10 sec x 5 reps Shoulder flexion green TB btn hands x 10  SASH supine green TB x 15 R/L x 10 x 2 sets  Dead bug x 10  Standing mini squats x 10  Heel raises x 10  Hip abduction standing leading with heel pause x 10 x 2 sets R/L  Sidelying clamshell green TB 3 sec x 10 R/L Sidelying reverse clamshell no resistance 3 sec x 10  R/L   PATIENT EDUCATION:  Education details: intro to aquatic therapy  Person  educated: Patient Education method: Explanation, Demonstration, Tactile cues, Verbal cues, Education comprehension: verbalized understanding, returned demonstration, verbal cues required, tactile cues required, and needs further education  HOME EXERCISE PROGRAM: Access Code: [email or web address] URL: [email or web address] Date: 03/28/2024 Prepared by: Celyn Holt  Exercises - Supine March  - 2 x daily - 7 x weekly - 1-2 sets - 3 reps - 2 sec  hold - Supine Lower Trunk Rotation  - 2 x daily - 7 x weekly - 1 sets - 3-5 reps - 20-30 sec  hold - Sidelying Hip Abduction  - 1 x daily - 7 x weekly - 3 sets - 10 reps - 3-5 sec  hold - Clamshell  - 2 x daily - 7 x weekly - 1 sets - 10 reps - 2-3 sec  hold - Sidelying Reverse Clamshell  - 2 x daily - 7 x weekly - 1-2 sets - 10 reps - 2-3 sec  hold - Supine Diaphragmatic Breathing  - 2 x daily - 7 x weekly - 1 sets - 10 reps - 4-6 sec  hold - Supine Quad Set  - 2 x daily - 7 x weekly - 1 sets - 10 reps - 3 sec  hold - Small Range Straight Leg Raise  - 2 x daily - 7 x weekly - 1 sets - 10 reps - 5 sec  hold - Hooklying Hamstring Stretch with Strap  - 2 x daily - 7 x weekly - 1 sets - 3 reps - 30 sec  hold - Supine Alternating Shoulder Flexion  - 2 x daily - 7 x weekly - 1-2 sets - 10 reps - 2 sec  hold - Dead Bug  - 1 x daily - 7 x weekly - 1-2 sets -  10 reps - 2 sec  hold - Seated Shoulder Diagonal Pulls with Resistance  - 1 x daily - 7 x weekly - 1-2 sets - 10 reps - 3 sec  hold - Wall Quarter Squat  - 2 x daily - 7 x weekly - 1-2 sets - 10 reps - 5-10 sec  hold - Standing Hip Abduction with Counter Support  - 1 x daily - 7 x weekly - 2-3 sets - 10 reps - 2-3 sec  hold  ASSESSMENT:  CLINICAL IMPRESSION: Pt reported increased LE fatigue after completing 2nd set of LE exercises away from wall. Pt reported back pain eliminated during aquatic session.  Pt to begin looking into local pool to prepare for transition to independent aquatic exercise.  She  is progressing toward remaining goals of therapy and will benefit from continued treatment.    EVAL: Patient is a 63 y.o. female who was seen today for physical therapy evaluation and treatment for R sided LBP; chronic R knee pain. Patient reports that she fell back into her coffee tale after a nap 02/18/24 striking her R LB and posterior hip. She has had severe pain in the R back and hip since that time. She reports chronic R knee pain in the past 5 years. She has a history of L knee surgery ? Date and continues to have limited ROM and strength L LE. Patient has forward flexed posture and alignment; difficulty with all transfers and transitional movements; limited trunk and LE mobility/ROM; decreased core and LE strength; abnormal and unsafe gait pattern with cane that was too tall for her(adjusted today and pt was instructed in proper three point gait pattern). Patient has pain limiting all functional activities. She will benefit from PT  to address problems identified.    GOALS: Goals reviewed with patient? Yes  SHORT TERM GOALS: Target date: 03/27/2024  Independent in initial HEP  Baseline: Goal status: met  2.  Safe gait for home and short distances of 50-100 ft with appropriate assistive device  Baseline:  Goal status: met  3.  Decrease pain by 25-50% allowing patient to increase normal activities  Baseline:  Goal status: met    LONG TERM GOALS: Target date: 04/24/2024  Increase trunk ROM to 50-70% with minimal to no pain  Baseline:  Goal status: on going   2.  Patient reports ability to sit for 30 min and stand for 10 min without increased pain  Baseline:  Goal status: met  3.  Increased ROM bilt knee extension to (-)10 degrees and flexion to 100 deg R and 90 deg L  Baseline:  Goal status: on going   4.  Increase strength to 4/5 to 4+/5 bilat LE's  Baseline:  Goal status: partially met   5.  Independent in HEP including aquatic program as indicated  Baseline:  Goal  status: on going -04/26/24  6.  Improve modified Owestry score by 10-15 points  Baseline: 37/50; 74%  04/03/24: 24/50; 48% Goal status: met   PLAN:  PT FREQUENCY: 2x/week  PT DURATION: 8 weeks  PLANNED INTERVENTIONS: 97110-Therapeutic exercises, 97530- Therapeutic activity, 97112- Neuromuscular re-education, 97535- Self Care, 16109- Manual therapy, [phone]- Gait training, [phone]- Aquatic Therapy, Patient/Family education, Balance training, Stair training, Taping, Dry Needling, and Joint mobilization.  PLAN FOR NEXT SESSION: review and progress exercises; continue with spine care and ergonomic education; core strengthening and stabilization; LE strengthening; manual work and modalities as indicated   Almedia Jacobsen, PTA 04/26/24 12:11 PM [facility]  [facility] [address] [place], [place], [address] Phone: [phone]   Fax:  [phone]

## 2024-05-03 ENCOUNTER — Encounter (HOSPITAL_BASED_OUTPATIENT_CLINIC_OR_DEPARTMENT_OTHER): Payer: Self-pay | Admitting: Physical Therapy

## 2024-05-03 ENCOUNTER — Ambulatory Visit (HOSPITAL_BASED_OUTPATIENT_CLINIC_OR_DEPARTMENT_OTHER): Payer: Self-pay | Admitting: Physical Therapy

## 2024-05-03 DIAGNOSIS — M25561 Pain in right knee: Secondary | ICD-10-CM | POA: Diagnosis not present

## 2024-05-03 DIAGNOSIS — G8929 Other chronic pain: Secondary | ICD-10-CM | POA: Diagnosis not present

## 2024-05-03 DIAGNOSIS — M6281 Muscle weakness (generalized): Secondary | ICD-10-CM

## 2024-05-03 DIAGNOSIS — M5459 Other low back pain: Secondary | ICD-10-CM | POA: Diagnosis not present

## 2024-05-03 DIAGNOSIS — R29898 Other symptoms and signs involving the musculoskeletal system: Secondary | ICD-10-CM

## 2024-05-03 NOTE — Therapy (Signed)
 OUTPATIENT PHYSICAL THERAPY THORACOLUMBAR TREATMENT   Patient Name: Carol Franklin MRN: [id number] DOB:1961-05-22, 63 y.o., female Today's Date: 05/03/2024  END OF SESSION:  PT End of Session - 05/03/24 1159     Visit Number 14    Number of Visits 24    Date for PT Re-Evaluation 06/04/24    Authorization Type [employer] federal plan copay $35    Authorization Time Period year - 50 visits per year - 37remaining    Authorization - Visit Number 14    Authorization - Number of Visits 37    PT Start Time 1145    PT Stop Time 1223    PT Time Calculation (min) 38 min    Activity Tolerance Patient tolerated treatment well    Behavior During Therapy WFL for tasks assessed/performed           Past Medical History:  Diagnosis Date   Abnormal EKG    BMI 35.0-35.9,adult 01/06/2012   Chronic pain of both knees 01/13/2020   Chronic right-sided low back pain with right-sided sciatica 10/21/2020   Eczema 02/14/2020   Essential thrombocytosis (HCC) 01/13/2012   Fatigue 10/21/2020   Hyperlipidemia 01/13/2012   Hypertension    Intertrigo    Loud snoring 10/21/2020   Menopausal hot flushes 09/17/2014   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/2012   Osteopenia 12/04/2015   Person encountering health services to consult on behalf of another person 03/03/2023   Post-operative nausea and vomiting    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   Weight gain 02/14/2020   Past Surgical History:  Procedure Laterality Date   CESAREAN SECTION     X2   DILATION AND CURETTAGE OF UTERUS     KNEE SURGERY     left   Patient Active Problem List   Diagnosis Date Noted   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   Post-operative nausea and vomiting    Intertrigo    Hypertension    Abnormal EKG    Person encountering health services to consult on behalf of another person 03/03/2023   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   Fatigue 10/21/2020   Chronic right-sided low back pain with right-sided sciatica 10/21/2020    Loud snoring 10/21/2020   Eczema 02/14/2020   Weight gain 02/14/2020   Primary osteoarthritis of both knees 01/13/2020   Osteopenia 12/04/2015   Menopausal hot flushes 09/17/2014   Essential thrombocytosis (HCC) 01/13/2012   Hyperlipidemia 01/13/2012   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/2012   BMI 35.0-35.9,adult 01/06/2012    PCP: Zada Palin, NP  REFERRING PROVIDER: Zada Palin, NP  REFERRING DIAG: R sided LBP; chronic R knee   Rationale for Evaluation and Treatment: Rehabilitation  THERAPY DIAG:  Other low back pain  Other symptoms and signs involving the musculoskeletal system  Chronic pain of right knee  Muscle weakness (generalized)  ONSET DATE: 02/18/24  SUBJECTIVE:  SUBJECTIVE STATEMENT: Patient reports that she was sore after initial aquatic therapy session.  POOL ACCESS:   EVAL: Patient reports that she was napping 02/18/24 on her sofa and awoke and stumbled backward hitting her R LB to R hip. She has noted some improvement but is still having pain the back and hip. She has a history R knee pain for the past 5 years. She has had some PT for the R knee but did not continue to exercise. She has gained ~ 20 pounds in the past year.   PERTINENT HISTORY:  Denies medical problems; L knee injury not on R   PAIN:  Are you having pain? Yes: NPRS scale: 3/10 in morning;  Pain location: R LB to R posterior hip Pain description: s dull; aching  Aggravating factors: standing > 5 min; walking > 3 min; sitting > 15 min Relieving factors: lying on L side; ice    PRECAUTIONS: None  WEIGHT BEARING RESTRICTIONS: No  FALLS:  Has patient fallen in last 6 months? Yes. 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  DIAGNOSTIC FINDINGS:  Xray 02/26/24 - results not available   PATIENT SURVEYS:  Modified Oswestry 37/50; 74%  04/03/24: modified Oswestry 24/50; 48%  SENSATION: WFL  MUSCLE LENGTH: Hamstrings: Right 55 deg; Left 55 deg Thomas test: tight hip flexors   POSTURE: rounded shoulders, forward head, increased thoracic kyphosis, and flexed trunk   PALPATION: Tender and tight R posterior hip and lumbar paraspinals to lats - transverse abdominals   MOBILITY/TRANSFERS;  Patient with difficulty with all transitional movements sit to stand; stand to sit; sit to supine; rolling to either side; unable to assume prone position   LUMBAR ROM:   AROM eval 04/03/24 04/23/24  Flexion 30% hands on thighs pain R LB 75% Minimal pain twinge 80%  Extension 20% painful  50% pull 50% pull   Right lateral flexion 60% discomfort  90% pull  90%  Left lateral flexion 55% discomfort  70% min pain 75% mild pain L LB  Right rotation 10% stiff/tight  60% 60%  Left rotation 10% stiff/tight  50% min pain  50% mild pain L LB   (Blank rows = not tested)  LOWER EXTREMITY ROM:   limited throughout bilat LE's R > L     04/03/24: hip and knee ROM remain limited  Active  Right eval Left eval Right 04/23/24 Left  04/23/24  Hip flexion 100 95 106 100  Hip extension Unable to achieve neutral  Unable to achieve neutral Neutral  ~ (-) 10 deg   Hip abduction      Hip adduction      Hip internal rotation      Hip external rotation       Knee flexion -22 -20 -19 -9  Knee extension 89 78 95 78  Ankle dorsiflexion      Ankle plantarflexion      Ankle inversion      Ankle  eversion       (Blank rows = not tested)  LOWER EXTREMITY MMT:  Eval: difficult to assess strength - patient mobility limited          03/25/24: improving mobility to assess LE strength   MMT Right eval Left eval Right  03/25/24 Left  03/25/24 Right 04/23/24  Left  04/23/24   Hip flexion 4- 4- 4 4 4  4+  Hip extension        Hip abduction 3+ 3+ 4- 4- 4- 4  Hip adduction        Hip internal rotation        Hip external rotation        Knee flexion 4 4- 4 4 4+ 4+  Knee extension 4 4- 4 4 4+ 4+  Ankle dorsiflexion        Ankle plantarflexion        Ankle inversion        Ankle eversion         (Blank rows = not tested)  LUMBAR SPECIAL TESTS:  Straight leg raise test: Negative and Slump test: Negative  FUNCTIONAL TESTS:  EVAL: 5 times sit to stand: unable to assess  04/23/24:  5 times sit to stand: 25.48 sec no use of UE's SLS R 10 sec; L 5 sec   GAIT: Distance walked: 40 feet  Assistive device utilized: Single point cane which was too high for patient  Level of assistance: Complete Independence Comments: antalgic gait; trunk, hips, knees flexed; decreased wt bearing R LE with stance to push off  04/03/24: ambulating without assistive device; continued antalgic gait with poor weight shift more to R; hips and knees flexed throughout gait cycle   [facility] Adult PT Treatment:                                            05/03/24 Pt seen for aquatic therapy today.  Treatment took place in water 3.5-4.75 ft in depth at the [facility] pool. Temp of water was 91.  Pt entered/exited the pool via stairs independently with bilat rail in step-to and step-through pattern.  - unsupported walking forward, backward (not tolerated), side stepping (not tolerated)  - straddling noodle and holding corner, gentle cycling for decompression of spine/LE, hip  abdct/ add and hip flex/ext - still on noodle,  UE on yellow hand floats walking forward/ backward, 2 laps (improved tolerance), side stepping ( limited tolerance) - UE on yellow hand floats: marching - UE on wall:  toe/heel raises x20; hip add/abd 2x 10 ; hip flexion  x 10  - TrA set with single rainbow hand float pull down to thighs x 2-> moved to 1/2 hollow noodle for improved tolerance -> alternating knee taps to noodle - staggered stance:  - kick board row 2 x 10 each;    [facility] Adult PT Treatment:                                            04/26/24 Pt seen for aquatic therapy today.  Treatment took place in water 3.5-4.75 ft in depth at the [facility] pool. Temp of water was 91.  Pt entered/exited the pool via stairs independently with bilat rail in step-to and step-through pattern.   -  UE on yellow hand floats walking forward/ backward, multiple laps - UE on yellow hand floats: side stepping 2 laps - UE on yellow hand floats:  toe/heel raises x20; hip add/abd 2x 10 ; hip flexion /extension 2 x 10 (legs fatigued); marching in place - TrA set with single rainbow hand float pull down to thighs x 2-> moved to 1/2 hollow noodle for improved tolerance -> alternating knee taps to noodle - staggered stance: horiz abdct/ addct with rainbow hand floats on top of water x 10 each; kick board row x 10 each;  - straddling noodle and holding corner, gentle cycling for decompression of spine/LE   [facility] Adult PT Treatment:                                                DATE: 04/23/2024 Re-assessment for recert  Therapeutic Exercise: Trunk rotation supine x 10  Neuromuscular re-ed: Bridges + hip abd iso with blue TB distal thighs 2x10 Quad set --> small range SLR 3 sec x 10 R/L Small range SLR in ER 3 sec x 10 R/L Therapeutic Activity: Sit to stand x 5  Shoulder flexion blue TB btn hands x 10  SASH supine blue TB x 15 R/L x 10 x 2 sets  Dead bug x 10  Standing mini squats x 10  Heel  raises x 10  Hip abduction standing leading with heel pause x 10 x 2 sets R/L  Sidelying clamshell blue TB 3 sec x 10 R/L Sidelying reverse clamshell no resistance 3 sec x 10  R/L  [facility] Adult PT Treatment:                                                DATE: 04/03/2024 Therapeutic Exercise: NuStep L6 x 7 min Trunk rotation supine x 10  Neuromuscular re-ed: Supine hooklying hip abd green TB 3 sec x 10 R/L Bridges + hip abd iso with green TB distal thighs 2x10 Quad set --> small range SLR 3 sec x 10 R/L Small range SLR in ER 3 sec x 10 R/L Heel slides with orange physio ball x 10   Therapeutic Activity: Standing mini wall squat 5-10 sec x 5 reps Shoulder flexion green TB btn hands x 10  SASH supine green TB x 15 R/L x 10 x 2 sets  Dead bug x 10  Standing mini squats x 10  Heel raises x 10  Hip abduction standing leading with heel pause x 10 x 2 sets R/L  Sidelying clamshell green TB 3 sec x 10 R/L Sidelying reverse clamshell no resistance 3 sec x 10  R/L   PATIENT EDUCATION:  Education details: intro to aquatic therapy  Person educated: Patient Education method: Explanation, Demonstration, Tactile cues, Verbal cues, Education comprehension: verbalized understanding, returned demonstration, verbal cues required, tactile cues required, and needs further education  HOME EXERCISE PROGRAM: Access Code: [email or web address] URL: [email or web address] Date: 03/28/2024 Prepared by: Celyn Holt  Exercises - Supine March  - 2 x daily - 7 x weekly - 1-2 sets - 3 reps - 2 sec  hold - Supine Lower Trunk Rotation  - 2 x daily - 7 x weekly - 1 sets - 3-5 reps - 20-30 sec  hold - Sidelying Hip Abduction  - 1 x daily - 7 x weekly - 3 sets - 10 reps - 3-5 sec  hold - Clamshell  - 2 x daily - 7 x weekly - 1 sets - 10 reps - 2-3 sec  hold - Sidelying Reverse Clamshell  - 2 x daily - 7 x weekly - 1-2 sets - 10 reps - 2-3 sec  hold - Supine Diaphragmatic Breathing  - 2 x daily - 7 x weekly - 1  sets - 10 reps - 4-6 sec  hold - Supine Quad Set  - 2 x daily - 7 x weekly - 1 sets - 10 reps - 3 sec  hold - Small Range Straight Leg Raise  - 2 x daily - 7 x weekly - 1 sets - 10 reps - 5 sec  hold - Hooklying Hamstring Stretch with Strap  - 2 x daily - 7 x weekly - 1 sets - 3 reps - 30 sec  hold - Supine Alternating Shoulder Flexion  - 2 x daily - 7 x weekly - 1-2 sets - 10 reps - 2 sec  hold - Dead Bug  - 1 x daily - 7 x weekly - 1-2 sets - 10 reps - 2 sec  hold - Seated Shoulder Diagonal Pulls with Resistance  - 1 x daily - 7 x weekly - 1-2 sets - 10 reps - 3 sec  hold - Wall Quarter Squat  - 2 x daily - 7 x weekly - 1-2 sets - 10 reps - 5-10 sec  hold - Standing Hip Abduction with Counter Support  - 1 x daily - 7 x weekly - 2-3 sets - 10 reps - 2-3 sec  hold  ASSESSMENT:  CLINICAL IMPRESSION: High pain level upon arrival; unable to tolerate walking.   After time in deeper water cycling, pain eased and she was able to tolerate backward/forward walking without issues.  Increased pain in L knee after completing staggered stance; eased some with forward walking. L knee pain reduced to 6/10 and back pain to 4/10.   She is progressing toward remaining goals of therapy and will benefit from continued treatment. Encouraged pt to find local pool to continue mobility exercises in water outside of sessions.  Pt has 3 more scheduled sessions in cert period. Plan to begin creating HEP and instructing, prior to issuance.    EVAL: Patient is a 63 y.o. female who was seen today for physical therapy evaluation and treatment for R sided LBP; chronic R knee pain. Patient reports that she fell back into her coffee tale after a nap 02/18/24 striking her R LB and posterior hip. She has had severe pain in the R back and hip since that time. She reports chronic R knee pain in the past 5 years. She has a history of L knee surgery ? Date and continues to have limited ROM and strength L LE. Patient has forward flexed  posture and alignment; difficulty with all transfers and transitional movements; limited trunk and LE mobility/ROM; decreased core and LE strength; abnormal and unsafe gait pattern with cane that was too tall for her(adjusted today and pt was instructed in proper three point gait pattern). Patient has pain limiting all functional activities. She will benefit from PT  to address problems identified.    GOALS: Goals reviewed with patient? Yes  SHORT TERM GOALS: Target date: 03/27/2024  Independent in initial HEP  Baseline: Goal status: met  2.  Safe gait  for home and short distances of 50-100 ft with appropriate assistive device  Baseline:  Goal status: met  3.  Decrease pain by 25-50% allowing patient to increase normal activities  Baseline:  Goal status: met    LONG TERM GOALS: Target date: 04/24/2024  Increase trunk ROM to 50-70% with minimal to no pain  Baseline:  Goal status: on going   2.  Patient reports ability to sit for 30 min and stand for 10 min without increased pain  Baseline:  Goal status: met  3.  Increased ROM bilt knee extension to (-)10 degrees and flexion to 100 deg R and 90 deg L  Baseline:  Goal status: on going   4.  Increase strength to 4/5 to 4+/5 bilat LE's  Baseline:  Goal status: partially met   5.  Independent in HEP including aquatic program as indicated  Baseline:  Goal status: on going -04/26/24  6.  Improve modified Owestry score by 10-15 points  Baseline: 37/50; 74%  04/03/24: 24/50; 48% Goal status: met   PLAN:  PT FREQUENCY: 2x/week  PT DURATION: 8 weeks  PLANNED INTERVENTIONS: 97110-Therapeutic exercises, 97530- Therapeutic activity, 97112- Neuromuscular re-education, 97535- Self Care, [id number]- Manual therapy, [phone]- Gait training, [phone]- Aquatic Therapy, Patient/Family education, Balance training, Stair training, Taping, Dry Needling, and Joint mobilization.  PLAN FOR NEXT SESSION: review and progress exercises; continue with  spine care and ergonomic education; core strengthening and stabilization; LE strengthening;  Delon Aquas, PTA 05/03/24 4:03 PM [facility] [address] [place], [place], [address] Phone: [phone]   Fax:  [phone]

## 2024-05-09 ENCOUNTER — Ambulatory Visit (HOSPITAL_BASED_OUTPATIENT_CLINIC_OR_DEPARTMENT_OTHER): Payer: Self-pay | Attending: Medical-Surgical | Admitting: Physical Therapy

## 2024-05-09 ENCOUNTER — Encounter (HOSPITAL_BASED_OUTPATIENT_CLINIC_OR_DEPARTMENT_OTHER): Payer: Self-pay | Admitting: Physical Therapy

## 2024-05-09 DIAGNOSIS — M25561 Pain in right knee: Secondary | ICD-10-CM | POA: Diagnosis not present

## 2024-05-09 DIAGNOSIS — M6281 Muscle weakness (generalized): Secondary | ICD-10-CM | POA: Diagnosis not present

## 2024-05-09 DIAGNOSIS — R29898 Other symptoms and signs involving the musculoskeletal system: Secondary | ICD-10-CM | POA: Insufficient documentation

## 2024-05-09 DIAGNOSIS — M5459 Other low back pain: Secondary | ICD-10-CM | POA: Insufficient documentation

## 2024-05-09 DIAGNOSIS — G8929 Other chronic pain: Secondary | ICD-10-CM | POA: Insufficient documentation

## 2024-05-09 NOTE — Therapy (Signed)
 OUTPATIENT PHYSICAL THERAPY THORACOLUMBAR TREATMENT   Patient Name: Carol Franklin MRN: [id number] DOB:1961/01/24, 63 y.o., female Today's Date: 05/09/2024  END OF SESSION:  PT End of Session - 05/09/24 1536     Visit Number 15    Number of Visits 24    Date for PT Re-Evaluation 06/04/24    Authorization Type [employer] federal plan copay $35    Authorization Time Period year - 50 visits per year - 37remaining    Authorization - Number of Visits 37    PT Start Time 1535    PT Stop Time 1615    PT Time Calculation (min) 40 min    Activity Tolerance Patient tolerated treatment well    Behavior During Therapy WFL for tasks assessed/performed           Past Medical History:  Diagnosis Date   Abnormal EKG    BMI 35.0-35.9,adult 01/06/2012   Chronic pain of both knees 01/13/2020   Chronic right-sided low back pain with right-sided sciatica 10/21/2020   Eczema 02/14/2020   Essential thrombocytosis (HCC) 01/13/2012   Fatigue 10/21/2020   Hyperlipidemia 01/13/2012   Hypertension    Intertrigo    Loud snoring 10/21/2020   Menopausal hot flushes 09/17/2014   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/2012   Osteopenia 12/04/2015   Person encountering health services to consult on behalf of another person 03/03/2023   Post-operative nausea and vomiting    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   Weight gain 02/14/2020   Past Surgical History:  Procedure Laterality Date   CESAREAN SECTION     X2   DILATION AND CURETTAGE OF UTERUS     KNEE SURGERY     left   Patient Active Problem List   Diagnosis Date Noted   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   Post-operative nausea and vomiting    Intertrigo    Hypertension    Abnormal EKG    Person encountering health services to consult on behalf of another person 03/03/2023   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   Fatigue 10/21/2020   Chronic right-sided low back pain with right-sided sciatica 10/21/2020   Loud snoring 10/21/2020   Eczema  02/14/2020   Weight gain 02/14/2020   Primary osteoarthritis of both knees 01/13/2020   Osteopenia 12/04/2015   Menopausal hot flushes 09/17/2014   Essential thrombocytosis (HCC) 01/13/2012   Hyperlipidemia 01/13/2012   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/2012   BMI 35.0-35.9,adult 01/06/2012    PCP: Zada Palin, NP  REFERRING PROVIDER: Zada Palin, NP  REFERRING DIAG: R sided LBP; chronic R knee   Rationale for Evaluation and Treatment: Rehabilitation  THERAPY DIAG:  Other low back pain  Other symptoms and signs involving the musculoskeletal system  Chronic pain of right knee  ONSET DATE: 02/18/24  SUBJECTIVE:  SUBJECTIVE STATEMENT: Patient reports 8/10 pain knee today  POOL ACCESS:   EVAL: Patient reports that she was napping 02/18/24 on her sofa and awoke and stumbled backward hitting her R LB to R hip. She has noted some improvement but is still having pain the back and hip. She has a history R knee pain for the past 5 years. She has had some PT for the R knee but did not continue to exercise. She has gained ~ 20 pounds in the past year.   PERTINENT HISTORY:  Denies medical problems; L knee injury not on R   PAIN:  Are you having pain? Yes: NPRS scale: 6/10  Pain location: R LB to R posterior hip Pain description: s dull; aching  Aggravating factors: standing > 5 min; walking > 3 min; sitting > 15 min Relieving factors: lying on L side; ice    PRECAUTIONS: None  WEIGHT BEARING RESTRICTIONS: No  FALLS:  Has patient fallen in last 6 months? Yes. 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  DIAGNOSTIC FINDINGS:  Xray 02/26/24 - results not available   PATIENT SURVEYS:  Modified Oswestry 37/50; 74%  04/03/24: modified Oswestry 24/50; 48%  SENSATION: WFL  MUSCLE LENGTH: Hamstrings: Right 55 deg; Left 55 deg Thomas test: tight hip flexors   POSTURE: rounded shoulders, forward head, increased thoracic kyphosis, and flexed trunk   PALPATION: Tender and tight R posterior hip and lumbar paraspinals to lats - transverse abdominals   MOBILITY/TRANSFERS;  Patient with difficulty with all transitional movements sit to stand; stand to sit; sit to supine; rolling to either side; unable to assume prone position   LUMBAR ROM:   AROM eval 04/03/24 04/23/24  Flexion 30% hands on thighs pain R LB 75% Minimal pain twinge 80%  Extension 20% painful  50% pull 50% pull   Right lateral flexion 60% discomfort  90% pull  90%  Left lateral flexion 55% discomfort  70% min pain 75% mild pain L LB  Right rotation 10% stiff/tight  60% 60%  Left rotation 10% stiff/tight  50% min pain  50% mild pain L LB   (Blank rows = not tested)  LOWER EXTREMITY ROM:   limited throughout bilat LE's R > L     04/03/24: hip and knee ROM remain limited  Active  Right eval Left eval Right 04/23/24 Left  04/23/24  Hip flexion 100 95 106 100  Hip extension Unable to achieve neutral  Unable to achieve neutral Neutral  ~ (-) 10 deg   Hip abduction      Hip adduction      Hip internal rotation      Hip external rotation      Knee flexion -22 -20 -19 -9  Knee extension 89 78 95 78  Ankle dorsiflexion      Ankle plantarflexion      Ankle  inversion      Ankle eversion       (  Blank rows = not tested)  LOWER EXTREMITY MMT:  Eval: difficult to assess strength - patient mobility limited          03/25/24: improving mobility to assess LE strength   MMT Right eval Left eval Right  03/25/24 Left  03/25/24 Right 04/23/24  Left  04/23/24   Hip flexion 4- 4- 4 4 4  4+  Hip extension        Hip abduction 3+ 3+ 4- 4- 4- 4  Hip adduction        Hip internal rotation        Hip external rotation        Knee flexion 4 4- 4 4 4+ 4+  Knee extension 4 4- 4 4 4+ 4+  Ankle dorsiflexion        Ankle plantarflexion        Ankle inversion        Ankle eversion         (Blank rows = not tested)  LUMBAR SPECIAL TESTS:  Straight leg raise test: Negative and Slump test: Negative  FUNCTIONAL TESTS:  EVAL: 5 times sit to stand: unable to assess  04/23/24:  5 times sit to stand: 25.48 sec no use of UE's SLS R 10 sec; L 5 sec   GAIT: Distance walked: 40 feet  Assistive device utilized: Single point cane which was too high for patient  Level of assistance: Complete Independence Comments: antalgic gait; trunk, hips, knees flexed; decreased wt bearing R LE with stance to push off  04/03/24: ambulating without assistive device; continued antalgic gait with poor weight shift more to R; hips and knees flexed throughout gait cycle   [facility] Adult PT Treatment:                                            05/09/24 Pt seen for aquatic therapy today.  Treatment took place in water 3.5-4.75 ft in depth at the [facility] pool. Temp of water was 91.  Pt entered/exited the pool via stairs independently with bilat rail in step-to and step-through pattern.  - unsupported walking forward, backward , side stepping ; latter 2 tolerated with improvement - Seated on lift: hip add/abd; LAQ -L stretch -ue horizontal add/abd yellow HB wide stance - UE on wall:  toe/heel raises x20; hip add/abd 2x 10 ; hip flexion/ext  x 10  - TrA set with 1/2 hollow  noodle for improved tolerance -> alternating knee taps to noodle straddling noodle and holding corner, gentle cycling for decompression of spine/LE, hip abdct/ add and hip flex/ext    [facility] Adult PT Treatment:                                            05/03/24 Pt seen for aquatic therapy today.  Treatment took place in water 3.5-4.75 ft in depth at the [facility] pool. Temp of water was 91.  Pt entered/exited the pool via stairs independently with bilat rail in step-to and step-through pattern.  - unsupported walking forward, backward (not tolerated), side stepping (not tolerated)  - straddling noodle and holding corner, gentle cycling for decompression of spine/LE, hip abdct/ add and hip flex/ext - still on noodle,  UE on yellow hand floats walking forward/ backward, 2  laps (improved tolerance), side stepping ( limited tolerance) - UE on yellow hand floats: marching - UE on wall:  toe/heel raises x20; hip add/abd 2x 10 ; hip flexion  x 10  - TrA set with single rainbow hand float pull down to thighs x 2-> moved to 1/2 hollow noodle for improved tolerance -> alternating knee taps to noodle - staggered stance:  - kick board row 2 x 10 each;    [facility] Adult PT Treatment:                                            04/26/24 Pt seen for aquatic therapy today.  Treatment took place in water 3.5-4.75 ft in depth at the [facility] pool. Temp of water was 91.  Pt entered/exited the pool via stairs independently with bilat rail in step-to and step-through pattern.   - UE on yellow hand floats walking forward/ backward, multiple laps - UE on yellow hand floats: side stepping 2 laps - UE on yellow hand floats:  toe/heel raises x20; hip add/abd 2x 10 ; hip flexion /extension 2 x 10 (legs fatigued); marching in place - TrA set with single rainbow hand float pull down to thighs x 2-> moved to 1/2 hollow noodle for improved tolerance -> alternating knee taps to noodle - staggered stance:  horiz abdct/ addct with rainbow hand floats on top of water x 10 each; kick board row x 10 each;  - straddling noodle and holding corner, gentle cycling for decompression of spine/LE   [facility] Adult PT Treatment:                                                DATE: 04/23/2024 Re-assessment for recert  Therapeutic Exercise: Trunk rotation supine x 10  Neuromuscular re-ed: Bridges + hip abd iso with blue TB distal thighs 2x10 Quad set --> small range SLR 3 sec x 10 R/L Small range SLR in ER 3 sec x 10 R/L Therapeutic Activity: Sit to stand x 5  Shoulder flexion blue TB btn hands x 10  SASH supine blue TB x 15 R/L x 10 x 2 sets  Dead bug x 10  Standing mini squats x 10  Heel raises x 10  Hip abduction standing leading with heel pause x 10 x 2 sets R/L  Sidelying clamshell blue TB 3 sec x 10 R/L Sidelying reverse clamshell no resistance 3 sec x 10  R/L  [facility] Adult PT Treatment:                                                DATE: 04/03/2024 Therapeutic Exercise: NuStep L6 x 7 min Trunk rotation supine x 10  Neuromuscular re-ed: Supine hooklying hip abd green TB 3 sec x 10 R/L Bridges + hip abd iso with green TB distal thighs 2x10 Quad set --> small range SLR 3 sec x 10 R/L Small range SLR in ER 3 sec x 10 R/L Heel slides with orange physio ball x 10   Therapeutic Activity: Standing mini wall squat 5-10 sec x 5 reps Shoulder flexion green TB btn  hands x 10  SASH supine green TB x 15 R/L x 10 x 2 sets  Dead bug x 10  Standing mini squats x 10  Heel raises x 10  Hip abduction standing leading with heel pause x 10 x 2 sets R/L  Sidelying clamshell green TB 3 sec x 10 R/L Sidelying reverse clamshell no resistance 3 sec x 10  R/L   PATIENT EDUCATION:  Education details: intro to aquatic therapy  Person educated: Patient Education method: Explanation, Demonstration, Tactile cues, Verbal cues, Education comprehension: verbalized understanding, returned demonstration, verbal cues  required, tactile cues required, and needs further education  HOME EXERCISE PROGRAM: Access Code: [email or web address] URL: [email or web address] Date: 03/28/2024 Prepared by: Celyn Holt  Exercises - Supine March  - 2 x daily - 7 x weekly - 1-2 sets - 3 reps - 2 sec  hold - Supine Lower Trunk Rotation  - 2 x daily - 7 x weekly - 1 sets - 3-5 reps - 20-30 sec  hold - Sidelying Hip Abduction  - 1 x daily - 7 x weekly - 3 sets - 10 reps - 3-5 sec  hold - Clamshell  - 2 x daily - 7 x weekly - 1 sets - 10 reps - 2-3 sec  hold - Sidelying Reverse Clamshell  - 2 x daily - 7 x weekly - 1-2 sets - 10 reps - 2-3 sec  hold - Supine Diaphragmatic Breathing  - 2 x daily - 7 x weekly - 1 sets - 10 reps - 4-6 sec  hold - Supine Quad Set  - 2 x daily - 7 x weekly - 1 sets - 10 reps - 3 sec  hold - Small Range Straight Leg Raise  - 2 x daily - 7 x weekly - 1 sets - 10 reps - 5 sec  hold - Hooklying Hamstring Stretch with Strap  - 2 x daily - 7 x weekly - 1 sets - 3 reps - 30 sec  hold - Supine Alternating Shoulder Flexion  - 2 x daily - 7 x weekly - 1-2 sets - 10 reps - 2 sec  hold - Dead Bug  - 1 x daily - 7 x weekly - 1-2 sets - 10 reps - 2 sec  hold - Seated Shoulder Diagonal Pulls with Resistance  - 1 x daily - 7 x weekly - 1-2 sets - 10 reps - 3 sec  hold - Wall Quarter Squat  - 2 x daily - 7 x weekly - 1-2 sets - 10 reps - 5-10 sec  hold - Standing Hip Abduction with Counter Support  - 1 x daily - 7 x weekly - 2-3 sets - 10 reps - 2-3 sec  hold  ASSESSMENT:  CLINICAL IMPRESSION: Pain continues to be high on arrival today with left knee > right.  She demonstrates improve tolerance to walking side ways and backward. Overall pain slightly reduces with session. Goals ongoing     EVAL: Patient is a 63 y.o. female who was seen today for physical therapy evaluation and treatment for R sided LBP; chronic R knee pain. Patient reports that she fell back into her coffee tale after a nap 02/18/24 striking  her R LB and posterior hip. She has had severe pain in the R back and hip since that time. She reports chronic R knee pain in the past 5 years. She has a history of L knee surgery ? Date and continues to have limited ROM and strength L  LE. Patient has forward flexed posture and alignment; difficulty with all transfers and transitional movements; limited trunk and LE mobility/ROM; decreased core and LE strength; abnormal and unsafe gait pattern with cane that was too tall for her(adjusted today and pt was instructed in proper three point gait pattern). Patient has pain limiting all functional activities. She will benefit from PT  to address problems identified.    GOALS: Goals reviewed with patient? Yes  SHORT TERM GOALS: Target date: 03/27/2024  Independent in initial HEP  Baseline: Goal status: met  2.  Safe gait for home and short distances of 50-100 ft with appropriate assistive device  Baseline:  Goal status: met  3.  Decrease pain by 25-50% allowing patient to increase normal activities  Baseline:  Goal status: met    LONG TERM GOALS: Target date: 04/24/2024  Increase trunk ROM to 50-70% with minimal to no pain  Baseline:  Goal status: on going   2.  Patient reports ability to sit for 30 min and stand for 10 min without increased pain  Baseline:  Goal status: met  3.  Increased ROM bilt knee extension to (-)10 degrees and flexion to 100 deg R and 90 deg L  Baseline:  Goal status: on going   4.  Increase strength to 4/5 to 4+/5 bilat LE's  Baseline:  Goal status: partially met   5.  Independent in HEP including aquatic program as indicated  Baseline:  Goal status: on going -04/26/24  6.  Improve modified Owestry score by 10-15 points  Baseline: 37/50; 74%  04/03/24: 24/50; 48% Goal status: met   PLAN:  PT FREQUENCY: 2x/week  PT DURATION: 8 weeks  PLANNED INTERVENTIONS: 97110-Therapeutic exercises, 97530- Therapeutic activity, 97112- Neuromuscular re-education,  97535- Self Care, 02859- Manual therapy, [phone]- Gait training, [phone]- Aquatic Therapy, Patient/Family education, Balance training, Stair training, Taping, Dry Needling, and Joint mobilization.  PLAN FOR NEXT SESSION: review and progress exercises; continue with spine care and ergonomic education; core strengthening and stabilization; LE strengthening;  Ronal Vernal) Kenwood Rosiak MPT 05/09/24 4:52 PM [facility] [address] [place], [place], [address] Phone: [phone]   Fax:  [phone]

## 2024-05-11 DIAGNOSIS — M9903 Segmental and somatic dysfunction of lumbar region: Secondary | ICD-10-CM | POA: Diagnosis not present

## 2024-05-13 DIAGNOSIS — M9903 Segmental and somatic dysfunction of lumbar region: Secondary | ICD-10-CM | POA: Diagnosis not present

## 2024-05-15 ENCOUNTER — Ambulatory Visit (HOSPITAL_BASED_OUTPATIENT_CLINIC_OR_DEPARTMENT_OTHER): Payer: Self-pay | Admitting: Physical Therapy

## 2024-05-17 ENCOUNTER — Encounter (HOSPITAL_BASED_OUTPATIENT_CLINIC_OR_DEPARTMENT_OTHER): Payer: Self-pay | Admitting: Physical Therapy

## 2024-05-17 ENCOUNTER — Ambulatory Visit (HOSPITAL_BASED_OUTPATIENT_CLINIC_OR_DEPARTMENT_OTHER): Payer: Self-pay | Admitting: Physical Therapy

## 2024-05-17 DIAGNOSIS — G8929 Other chronic pain: Secondary | ICD-10-CM | POA: Diagnosis not present

## 2024-05-17 DIAGNOSIS — M6281 Muscle weakness (generalized): Secondary | ICD-10-CM | POA: Diagnosis not present

## 2024-05-17 DIAGNOSIS — R29898 Other symptoms and signs involving the musculoskeletal system: Secondary | ICD-10-CM | POA: Diagnosis not present

## 2024-05-17 DIAGNOSIS — M25561 Pain in right knee: Secondary | ICD-10-CM | POA: Diagnosis not present

## 2024-05-17 DIAGNOSIS — M5459 Other low back pain: Secondary | ICD-10-CM

## 2024-05-17 NOTE — Therapy (Signed)
 OUTPATIENT PHYSICAL THERAPY THORACOLUMBAR TREATMENT   Patient Name: Carol Franklin MRN: [id number] DOB:20-Nov-1960, 63 y.o., female Today's Date: 05/17/2024  END OF SESSION:  PT End of Session - 05/17/24 1026     Visit Number 16    Number of Visits 24    Date for PT Re-Evaluation 06/04/24    Authorization Type [employer] federal plan copay $35    Authorization - Visit Number 16    Authorization - Number of Visits 37    PT Start Time 0852    PT Stop Time 0930    PT Time Calculation (min) 38 min    Activity Tolerance Patient tolerated treatment well    Behavior During Therapy [facility] for tasks assessed/performed            Past Medical History:  Diagnosis Date   Abnormal EKG    BMI 35.0-35.9,adult 01/06/2012   Chronic pain of both knees 01/13/2020   Chronic right-sided low back pain with right-sided sciatica 10/21/2020   Eczema 02/14/2020   Essential thrombocytosis (HCC) 01/13/2012   Fatigue 10/21/2020   Hyperlipidemia 01/13/2012   Hypertension    Intertrigo    Loud snoring 10/21/2020   Menopausal hot flushes 09/17/2014   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/2012   Osteopenia 12/04/2015   Person encountering health services to consult on behalf of another person 03/03/2023   Post-operative nausea and vomiting    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   Weight gain 02/14/2020   Past Surgical History:  Procedure Laterality Date   CESAREAN SECTION     X2   DILATION AND CURETTAGE OF UTERUS     KNEE SURGERY     left   Patient Active Problem List   Diagnosis Date Noted   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   Post-operative nausea and vomiting    Intertrigo    Hypertension    Abnormal EKG    Person encountering health services to consult on behalf of another person 03/03/2023   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   Fatigue 10/21/2020   Chronic right-sided low back pain with right-sided sciatica 10/21/2020   Loud snoring 10/21/2020   Eczema 02/14/2020   Weight gain  02/14/2020   Primary osteoarthritis of both knees 01/13/2020   Osteopenia 12/04/2015   Menopausal hot flushes 09/17/2014   Essential thrombocytosis (HCC) 01/13/2012   Hyperlipidemia 01/13/2012   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/2012   BMI 35.0-35.9,adult 01/06/2012    PCP: Zada Palin, NP  REFERRING PROVIDER: Zada Palin, NP  REFERRING DIAG: R sided LBP; chronic R knee   Rationale for Evaluation and Treatment: Rehabilitation  THERAPY DIAG:  Other low back pain  Other symptoms and signs involving the musculoskeletal system  Chronic pain of right knee  Muscle weakness (generalized)  ONSET DATE: 02/18/24  SUBJECTIVE:  SUBJECTIVE STATEMENT: Patient reports she thinks she will sign up for [employer] in [place] but is waiting to pay off some bills, and after husband's treatment.   POOL ACCESS: currently none.  EVAL: Patient reports that she was napping 02/18/24 on her sofa and awoke and stumbled backward hitting her R LB to R hip. She has noted some improvement but is still having pain the back and hip. She has a history R knee pain for the past 5 years. She has had some PT for the R knee but did not continue to exercise. She has gained ~ 20 pounds in the past year.   PERTINENT HISTORY:  Denies medical problems; L knee injury not on R   PAIN:  Are you having pain? Yes: NPRS scale: 6/10  Pain location: both knees Pain description:  dull; aching  Aggravating factors: standing > 5 min; walking > 3 min; sitting > 15 min Relieving factors: lying on L side; ice    PRECAUTIONS: None  WEIGHT BEARING RESTRICTIONS: No  FALLS:  Has patient fallen in last 6 months? Yes. 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  DIAGNOSTIC FINDINGS:  Xray 02/26/24 - results not available   PATIENT SURVEYS:  Modified Oswestry 37/50; 74%  04/03/24: modified Oswestry 24/50; 48%  SENSATION: WFL  MUSCLE LENGTH: Hamstrings: Right 55 deg; Left 55 deg Thomas test: tight hip flexors   POSTURE: rounded shoulders, forward head, increased thoracic kyphosis, and flexed trunk   PALPATION: Tender and tight R posterior hip and lumbar paraspinals to lats - transverse abdominals   MOBILITY/TRANSFERS;  Patient with difficulty with all transitional movements sit to stand; stand to sit; sit to supine; rolling to either side; unable to assume prone position   LUMBAR ROM:   AROM eval 04/03/24 04/23/24  Flexion 30% hands on thighs pain R LB 75% Minimal pain twinge 80%  Extension 20% painful  50% pull 50% pull   Right lateral flexion 60% discomfort  90% pull  90%  Left lateral flexion 55% discomfort  70% min pain 75% mild pain L LB  Right rotation 10% stiff/tight  60% 60%  Left rotation 10% stiff/tight  50% min pain  50% mild pain L LB   (Blank rows = not tested)  LOWER EXTREMITY ROM:   limited throughout bilat LE's R > L     04/03/24: hip and knee ROM remain limited  Active  Right eval Left eval Right 04/23/24 Left  04/23/24  Hip flexion 100 95 106 100  Hip extension Unable to achieve neutral  Unable to achieve neutral Neutral  ~ (-) 10 deg   Hip abduction      Hip adduction      Hip internal rotation      Hip external rotation       Knee flexion -22 -20 -19 -9  Knee extension 89 78 95 78  Ankle dorsiflexion      Ankle plantarflexion  Ankle inversion      Ankle eversion       (Blank rows = not tested)  LOWER EXTREMITY MMT:  Eval: difficult to assess strength - patient mobility limited          03/25/24: improving mobility to assess LE strength   MMT Right eval Left eval Right  03/25/24 Left  03/25/24 Right 04/23/24  Left  04/23/24   Hip flexion 4- 4- 4 4 4  4+  Hip extension        Hip abduction 3+ 3+ 4- 4- 4- 4  Hip adduction        Hip internal rotation        Hip external rotation        Knee flexion 4 4- 4 4 4+ 4+  Knee extension 4 4- 4 4 4+ 4+  Ankle dorsiflexion        Ankle plantarflexion        Ankle inversion        Ankle eversion         (Blank rows = not tested)  LUMBAR SPECIAL TESTS:  Straight leg raise test: Negative and Slump test: Negative  FUNCTIONAL TESTS:  EVAL: 5 times sit to stand: unable to assess  04/23/24:  5 times sit to stand: 25.48 sec no use of UE's SLS R 10 sec; L 5 sec   GAIT: Distance walked: 40 feet  Assistive device utilized: Single point cane which was too high for patient  Level of assistance: Complete Independence Comments: antalgic gait; trunk, hips, knees flexed; decreased wt bearing R LE with stance to push off  04/03/24: ambulating without assistive device; continued antalgic gait with poor weight shift more to R; hips and knees flexed throughout gait cycle   [facility] Adult PT Treatment:                                            05/17/24 Pt seen for aquatic therapy today.  Treatment took place in water 3.5-4.75 ft in depth at the [facility] pool. Temp of water was 91.  Pt entered/exited the pool via stairs independently with bilat rail in step-to and step-through pattern.  - unsupported walking forward, backward , side stepping ; - forward marching, forward walking kicks - UE on wall:  toe/heel raises x20; hip add/abd 2x 10 ; hip flexion/ext  x 10;  hamstring curls x 10 -return to walking forward / backward walking and side marching  - TrA set with 1/2 hollow noodle pull down to alternating knee taps to noodle with forward marching - straddling noodle and holding corner, gentle cycling for decompression of spine/LE, hip abdct/ add and hip flex/ext  [facility] Adult PT Treatment:                                            05/09/24 Pt seen for aquatic therapy today.  Treatment took place in water 3.5-4.75 ft in depth at the [facility] pool. Temp of water was 91.  Pt entered/exited the pool via stairs independently with bilat rail in step-to and step-through pattern.  - unsupported walking forward, backward , side stepping ; latter 2 tolerated with improvement - Seated on lift: hip add/abd; LAQ -L stretch -ue horizontal add/abd yellow HB wide  stance - UE on wall:  toe/heel raises x20; hip add/abd 2x 10 ; hip flexion/ext  x 10  - TrA set with 1/2 hollow noodle for improved tolerance -> alternating knee taps to noodle straddling noodle and holding corner, gentle cycling for decompression of spine/LE, hip abdct/ add and hip flex/ext    [facility] Adult PT Treatment:                                            05/03/24 Pt seen for aquatic therapy today.  Treatment took place in water 3.5-4.75 ft in depth at the [facility] pool. Temp of water was 91.  Pt entered/exited the pool via stairs independently with bilat rail in step-to and step-through pattern.  - unsupported walking forward, backward (not tolerated), side stepping (not tolerated)  - straddling noodle and holding corner, gentle cycling for decompression of spine/LE, hip abdct/ add and hip flex/ext - still on noodle,  UE on yellow hand floats walking forward/ backward, 2 laps (improved tolerance), side stepping ( limited tolerance) - UE on yellow hand floats: marching - UE on wall:  toe/heel raises x20; hip add/abd 2x 10 ; hip flexion  x 10  - TrA set with single rainbow hand  float pull down to thighs x 2-> moved to 1/2 hollow noodle for improved tolerance -> alternating knee taps to noodle - staggered stance:  - kick board row 2 x 10 each;    [facility] Adult PT Treatment:                                            04/26/24 Pt seen for aquatic therapy today.  Treatment took place in water 3.5-4.75 ft in depth at the [facility] pool. Temp of water was 91.  Pt entered/exited the pool via stairs independently with bilat rail in step-to and step-through pattern.   - UE on yellow hand floats walking forward/ backward, multiple laps - UE on yellow hand floats: side stepping 2 laps - UE on yellow hand floats:  toe/heel raises x20; hip add/abd 2x 10 ; hip flexion /extension 2 x 10 (legs fatigued); marching in place - TrA set with single rainbow hand float pull down to thighs x 2-> moved to 1/2 hollow noodle for improved tolerance -> alternating knee taps to noodle - staggered stance: horiz abdct/ addct with rainbow hand floats on top of water x 10 each; kick board row x 10 each;  - straddling noodle and holding corner, gentle cycling for decompression of spine/LE   [facility] Adult PT Treatment:                                                DATE: 04/23/2024 Re-assessment for recert  Therapeutic Exercise: Trunk rotation supine x 10  Neuromuscular re-ed: Bridges + hip abd iso with blue TB distal thighs 2x10 Quad set --> small range SLR 3 sec x 10 R/L Small range SLR in ER 3 sec x 10 R/L Therapeutic Activity: Sit to stand x 5  Shoulder flexion blue TB btn hands x 10  SASH supine blue TB x 15 R/L x 10 x  2 sets  Dead bug x 10  Standing mini squats x 10  Heel raises x 10  Hip abduction standing leading with heel pause x 10 x 2 sets R/L  Sidelying clamshell blue TB 3 sec x 10 R/L Sidelying reverse clamshell no resistance 3 sec x 10  R/L  [facility] Adult PT Treatment:                                                DATE: 04/03/2024 Therapeutic Exercise: NuStep L6 x 7  min Trunk rotation supine x 10  Neuromuscular re-ed: Supine hooklying hip abd green TB 3 sec x 10 R/L Bridges + hip abd iso with green TB distal thighs 2x10 Quad set --> small range SLR 3 sec x 10 R/L Small range SLR in ER 3 sec x 10 R/L Heel slides with orange physio ball x 10   Therapeutic Activity: Standing mini wall squat 5-10 sec x 5 reps Shoulder flexion green TB btn hands x 10  SASH supine green TB x 15 R/L x 10 x 2 sets  Dead bug x 10  Standing mini squats x 10  Heel raises x 10  Hip abduction standing leading with heel pause x 10 x 2 sets R/L  Sidelying clamshell green TB 3 sec x 10 R/L Sidelying reverse clamshell no resistance 3 sec x 10  R/L   PATIENT EDUCATION:  Education details: intro to aquatic therapy  Person educated: Patient Education method: Explanation, Demonstration, Tactile cues, Verbal cues, Education comprehension: verbalized understanding, returned demonstration, verbal cues required, tactile cues required, and needs further education  HOME EXERCISE PROGRAM: Access Code: [email or web address] URL: [email or web address] Date: 03/28/2024 Prepared by: Celyn Holt  Exercises - Supine March  - 2 x daily - 7 x weekly - 1-2 sets - 3 reps - 2 sec  hold - Supine Lower Trunk Rotation  - 2 x daily - 7 x weekly - 1 sets - 3-5 reps - 20-30 sec  hold - Sidelying Hip Abduction  - 1 x daily - 7 x weekly - 3 sets - 10 reps - 3-5 sec  hold - Clamshell  - 2 x daily - 7 x weekly - 1 sets - 10 reps - 2-3 sec  hold - Sidelying Reverse Clamshell  - 2 x daily - 7 x weekly - 1-2 sets - 10 reps - 2-3 sec  hold - Supine Diaphragmatic Breathing  - 2 x daily - 7 x weekly - 1 sets - 10 reps - 4-6 sec  hold - Supine Quad Set  - 2 x daily - 7 x weekly - 1 sets - 10 reps - 3 sec  hold - Small Range Straight Leg Raise  - 2 x daily - 7 x weekly - 1 sets - 10 reps - 5 sec  hold - Hooklying Hamstring Stretch with Strap  - 2 x daily - 7 x weekly - 1 sets - 3 reps - 30 sec  hold - Supine  Alternating Shoulder Flexion  - 2 x daily - 7 x weekly - 1-2 sets - 10 reps - 2 sec  hold - Dead Bug  - 1 x daily - 7 x weekly - 1-2 sets - 10 reps - 2 sec  hold - Seated Shoulder Diagonal Pulls with Resistance  - 1 x daily - 7 x weekly - 1-2  sets - 10 reps - 3 sec  hold - Wall Quarter Squat  - 2 x daily - 7 x weekly - 1-2 sets - 10 reps - 5-10 sec  hold - Standing Hip Abduction with Counter Support  - 1 x daily - 7 x weekly - 2-3 sets - 10 reps - 2-3 sec  hold  ASSESSMENT:  CLINICAL IMPRESSION: Pt required cues initially in session to allow knees to bend during swing through of gait.  Improved pain level and ROM after ~15 min of exercises. Pt discussed seeking referral for TKR this fall.  Discussed aftercare planning and what to expect after surgery. Pt is progressing towards remaining goals. Will finalized water HEP next visit.     EVAL: Patient is a 63 y.o. female who was seen today for physical therapy evaluation and treatment for R sided LBP; chronic R knee pain. Patient reports that she fell back into her coffee tale after a nap 02/18/24 striking her R LB and posterior hip. She has had severe pain in the R back and hip since that time. She reports chronic R knee pain in the past 5 years. She has a history of L knee surgery ? Date and continues to have limited ROM and strength L LE. Patient has forward flexed posture and alignment; difficulty with all transfers and transitional movements; limited trunk and LE mobility/ROM; decreased core and LE strength; abnormal and unsafe gait pattern with cane that was too tall for her(adjusted today and pt was instructed in proper three point gait pattern). Patient has pain limiting all functional activities. She will benefit from PT  to address problems identified.    GOALS: Goals reviewed with patient? Yes  SHORT TERM GOALS: Target date: 03/27/2024  Independent in initial HEP  Baseline: Goal status: met  2.  Safe gait for home and short distances of  50-100 ft with appropriate assistive device  Baseline:  Goal status: met  3.  Decrease pain by 25-50% allowing patient to increase normal activities  Baseline:  Goal status: met    LONG TERM GOALS: Target date: 04/24/2024  Increase trunk ROM to 50-70% with minimal to no pain  Baseline:  Goal status: on going   2.  Patient reports ability to sit for 30 min and stand for 10 min without increased pain  Baseline:  Goal status: met  3.  Increased ROM bilt knee extension to (-)10 degrees and flexion to 100 deg R and 90 deg L  Baseline:  Goal status: on going   4.  Increase strength to 4/5 to 4+/5 bilat LE's  Baseline:  Goal status: partially met   5.  Independent in HEP including aquatic program as indicated  Baseline:  Goal status: on going -04/26/24  6.  Improve modified Owestry score by 10-15 points  Baseline: 37/50; 74%  04/03/24: 24/50; 48% Goal status: met   PLAN:  PT FREQUENCY: 2x/week  PT DURATION: 8 weeks  PLANNED INTERVENTIONS: 97110-Therapeutic exercises, 97530- Therapeutic activity, 97112- Neuromuscular re-education, 97535- Self Care, [id number]- Manual therapy, [phone]- Gait training, [phone]- Aquatic Therapy, Patient/Family education, Balance training, Stair training, Taping, Dry Needling, and Joint mobilization.  PLAN FOR NEXT SESSION: review and progress exercises; continue with spine care and ergonomic education; core strengthening and stabilization; LE strengthening;  Delon Aquas, PTA 05/17/24 10:29 AM [facility] [address] [place], [place], [address] Phone: [phone]   Fax:  [phone]

## 2024-05-23 ENCOUNTER — Encounter (HOSPITAL_BASED_OUTPATIENT_CLINIC_OR_DEPARTMENT_OTHER): Payer: Self-pay | Admitting: Physical Therapy

## 2024-05-23 ENCOUNTER — Ambulatory Visit (HOSPITAL_BASED_OUTPATIENT_CLINIC_OR_DEPARTMENT_OTHER): Payer: Self-pay | Admitting: Physical Therapy

## 2024-05-23 DIAGNOSIS — M5459 Other low back pain: Secondary | ICD-10-CM | POA: Diagnosis not present

## 2024-05-23 DIAGNOSIS — G8929 Other chronic pain: Secondary | ICD-10-CM | POA: Diagnosis not present

## 2024-05-23 DIAGNOSIS — M6281 Muscle weakness (generalized): Secondary | ICD-10-CM | POA: Diagnosis not present

## 2024-05-23 DIAGNOSIS — R29898 Other symptoms and signs involving the musculoskeletal system: Secondary | ICD-10-CM

## 2024-05-23 DIAGNOSIS — M25561 Pain in right knee: Secondary | ICD-10-CM | POA: Diagnosis not present

## 2024-05-23 NOTE — Therapy (Addendum)
 OUTPATIENT PHYSICAL THERAPY THORACOLUMBAR TREATMENT PHYSICAL THERAPY DISCHARGE SUMMARY  Visits from Start of Care: 17  Current functional level related to goals / functional outcomes: See progress note for discharge status    Remaining deficits: Unknown    Education / Equipment: HEP    Patient agrees to discharge. Patient goals were met. Patient is being discharged due to meeting the stated rehab goals.  Celyn P. Ina PT, MPH 07/17/24 8:35 AM     Patient Name: BENNETTE HASTY MRN: [id number] DOB:Mar 12, 1961, 63 y.o., female Today's Date: 05/23/2024  END OF SESSION:  PT End of Session - 05/23/24 0815     Visit Number 17    Number of Visits 24    Date for PT Re-Evaluation 06/04/24    Authorization Type [employer] federal plan copay $35    Authorization Time Period year - 50 visits per year - 37remaining    PT Start Time 0800    PT Stop Time 0845    PT Time Calculation (min) 45 min    Behavior During Therapy [facility] for tasks assessed/performed            Past Medical History:  Diagnosis Date   Abnormal EKG    BMI 35.0-35.9,adult 01/06/2012   Chronic pain of both knees 01/13/2020   Chronic right-sided low back pain with right-sided sciatica 10/21/2020   Eczema 02/14/2020   Essential thrombocytosis (HCC) 01/13/2012   Fatigue 10/21/2020   Hyperlipidemia 01/13/2012   Hypertension    Intertrigo    Loud snoring 10/21/2020   Menopausal hot flushes 09/17/2014   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/2012   Osteopenia 12/04/2015   Person encountering health services to consult on behalf of another person 03/03/2023   Post-operative nausea and vomiting    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   Weight gain 02/14/2020   Past Surgical History:  Procedure Laterality Date   CESAREAN SECTION     X2   DILATION AND CURETTAGE OF UTERUS     KNEE SURGERY     left   Patient Active Problem List   Diagnosis Date Noted   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   Post-operative nausea and vomiting     Intertrigo    Hypertension    Abnormal EKG    Person encountering health services to consult on behalf of another person 03/03/2023   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   Fatigue 10/21/2020   Chronic right-sided low back pain with right-sided sciatica 10/21/2020   Loud snoring 10/21/2020   Eczema 02/14/2020   Weight gain 02/14/2020   Primary osteoarthritis of both knees 01/13/2020   Osteopenia 12/04/2015   Menopausal hot flushes 09/17/2014   Essential thrombocytosis (HCC) 01/13/2012   Hyperlipidemia 01/13/2012   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/2012   BMI 35.0-35.9,adult 01/06/2012    PCP: Zada Palin, NP  REFERRING PROVIDER: Zada Palin, NP  REFERRING DIAG: R sided LBP; chronic R knee   Rationale for Evaluation and Treatment: Rehabilitation  THERAPY DIAG:  Other low back pain  Other symptoms and signs involving the musculoskeletal system  Chronic pain of right knee  Muscle weakness (generalized)  ONSET DATE: 02/18/24  SUBJECTIVE:  SUBJECTIVE STATEMENT: Patient reports she plans to see Dr T and figure out the next step for her knees.  She is still considering knee replacements, but unsure which knee would be first.  Plans to begin getting in water after her husband's treatments are over.   POOL ACCESS: currently none.  EVAL: Patient reports that she was napping 02/18/24 on her sofa and awoke and stumbled backward hitting her R LB to R hip. She has noted some improvement but is still having pain the back and hip. She has a history R knee pain for the past 5 years. She has had some PT for the R knee but did not continue to exercise. She has gained ~ 20 pounds in the past year.   PERTINENT HISTORY:  Denies medical problems; L knee injury not on R   PAIN:  Are you having pain? Yes:  NPRS scale: 5/10  Pain location: both knees Pain description:  dull; aching  Aggravating factors: standing > 5 min; walking > 3 min; sitting > 15 min Relieving factors: lying on L side; ice    PRECAUTIONS: None  WEIGHT BEARING RESTRICTIONS: No  FALLS:  Has patient fallen in last 6 months? Yes. 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  DIAGNOSTIC FINDINGS:  Xray 02/26/24 - results not available   PATIENT SURVEYS:  Modified Oswestry 37/50; 74%  04/03/24: modified Oswestry 24/50; 48%  SENSATION: WFL  MUSCLE LENGTH: Hamstrings: Right 55 deg; Left 55 deg Thomas test: tight hip flexors   POSTURE: rounded shoulders, forward head, increased thoracic kyphosis, and flexed trunk   PALPATION: Tender and tight R posterior hip and lumbar paraspinals to lats - transverse abdominals   MOBILITY/TRANSFERS;  Patient with difficulty with all transitional movements sit to stand; stand to sit; sit to supine; rolling to either side; unable to assume prone position   LUMBAR ROM:   AROM eval 04/03/24 04/23/24  Flexion 30% hands on thighs pain R LB 75% Minimal pain twinge 80%  Extension 20% painful  50% pull 50% pull   Right lateral flexion 60% discomfort  90% pull  90%  Left lateral flexion 55% discomfort  70% min pain 75% mild pain L LB  Right rotation 10% stiff/tight  60% 60%  Left rotation 10% stiff/tight  50% min pain   50% mild pain L LB   (Blank rows = not tested)  LOWER EXTREMITY ROM:   limited throughout bilat LE's R > L     04/03/24: hip and knee ROM remain limited  Active  Right eval Left eval Right 04/23/24 Left  04/23/24  Hip flexion 100 95 106 100  Hip extension Unable to achieve neutral  Unable to achieve neutral Neutral  ~ (-) 10 deg   Hip abduction      Hip adduction      Hip internal rotation      Hip external rotation      Knee flexion -22 -20 -19 -9  Knee extension 89 78 95 78  Ankle dorsiflexion      Ankle plantarflexion      Ankle inversion      Ankle eversion       (Blank rows = not tested)  LOWER EXTREMITY MMT:  Eval: difficult to assess strength - patient mobility limited          03/25/24: improving mobility to assess LE strength   MMT Right eval Left eval Right  03/25/24 Left  03/25/24 Right 04/23/24  Left  04/23/24   Hip flexion 4- 4- 4 4 4  4+  Hip extension        Hip abduction 3+ 3+ 4- 4- 4- 4  Hip adduction        Hip internal rotation        Hip external rotation        Knee flexion 4 4- 4 4 4+ 4+  Knee extension 4 4- 4 4 4+ 4+  Ankle dorsiflexion        Ankle plantarflexion        Ankle inversion        Ankle eversion         (Blank rows = not tested)  LUMBAR SPECIAL TESTS:  Straight leg raise test: Negative and Slump test: Negative  FUNCTIONAL TESTS:  EVAL: 5 times sit to stand: unable to assess  04/23/24:  5 times sit to stand: 25.48 sec no use of UE's SLS R 10 sec; L 5 sec   GAIT: Distance walked: 40 feet  Assistive device utilized: Single point cane which was too high for patient  Level of assistance: Complete Independence Comments: antalgic gait; trunk, hips, knees flexed; decreased wt bearing R LE with stance to push off  04/03/24: ambulating without assistive device; continued antalgic gait with poor weight shift more to R; hips and knees flexed throughout gait cycle   [facility] Adult PT Treatment:                                             05/23/24 Pt seen for aquatic therapy today.  Treatment took place in water 3.5-4.75 ft in depth at the [facility] pool. Temp of water was 91.  Pt entered/exited the pool via stairs independently with bilat rail in step-to and step-through pattern.  - unsupported walking forward, backward , side stepping ; - straddling noodle and holding corner, gentle cycling for decompression of spine/LE, hip abdct/ add and hip flex/ext - forward/backward marching (painful with forward marching) - suitcase carry with single / bil yellow hand floats at side walking backwards/ forward - seated on bench, alternating LAQ with DF; hip abdct/add; cycling - TrA set with full hollow noodle pull down to alternating knee taps to noodle with forward marching - UE on wall:  toe/heel raises x20; hip add/abd 2x 10 ; hip flexion/ext  x 10; hamstring curls x 10 - at stairs - forward lunge with foot on 2nd step for knee flexion ROM <> hamstring stretch x 20s each position, x 4 reps each   [facility] Adult PT Treatment:                                            05/17/24 Pt seen for aquatic therapy today.  Treatment  took place in water 3.5-4.75 ft in depth at the [facility] pool. Temp of water was 91.  Pt entered/exited the pool via stairs independently with bilat rail in step-to and step-through pattern.  - unsupported walking forward, backward , side stepping ; - forward marching, forward walking kicks - UE on wall:  toe/heel raises x20; hip add/abd 2x 10 ; hip flexion/ext  x 10; hamstring curls x 10 -return to walking forward / backward walking and side marching  - TrA set with 1/2 hollow noodle pull down to alternating knee taps to noodle with forward marching - straddling noodle and holding corner, gentle cycling for decompression of spine/LE, hip abdct/ add and hip flex/ext  [facility] Adult PT Treatment:                                            05/09/24 Pt seen for aquatic therapy today.  Treatment took place  in water 3.5-4.75 ft in depth at the [facility] pool. Temp of water was 91.  Pt entered/exited the pool via stairs independently with bilat rail in step-to and step-through pattern.  - unsupported walking forward, backward , side stepping ; latter 2 tolerated with improvement - Seated on lift: hip add/abd; LAQ -L stretch -ue horizontal add/abd yellow HB wide stance - UE on wall:  toe/heel raises x20; hip add/abd 2x 10 ; hip flexion/ext  x 10  - TrA set with 1/2 hollow noodle for improved tolerance -> alternating knee taps to noodle straddling noodle and holding corner, gentle cycling for decompression of spine/LE, hip abdct/ add and hip flex/ext    [facility] Adult PT Treatment:                                            05/03/24 Pt seen for aquatic therapy today.  Treatment took place in water 3.5-4.75 ft in depth at the [facility] pool. Temp of water was 91.  Pt entered/exited the pool via stairs independently with bilat rail in step-to and step-through pattern.  - unsupported walking forward, backward (not tolerated), side stepping (not tolerated)  - straddling noodle and holding corner, gentle cycling for decompression of spine/LE, hip abdct/ add and hip flex/ext - still on noodle,  UE on yellow hand floats walking forward/ backward, 2 laps (improved tolerance), side stepping ( limited tolerance) - UE on yellow hand floats: marching - UE on wall:  toe/heel raises x20; hip add/abd 2x 10 ; hip flexion  x 10  - TrA set with single rainbow hand float pull down to thighs x 2-> moved to 1/2 hollow noodle for improved tolerance -> alternating knee taps to noodle - staggered stance:  - kick board row 2 x 10 each;    [facility] Adult PT Treatment:                                            04/26/24 Pt seen for aquatic therapy today.  Treatment took place in water 3.5-4.75 ft in depth at the [facility] pool. Temp of water was 91.  Pt entered/exited the pool via stairs  independently with bilat rail in step-to and step-through pattern.   -  UE on yellow hand floats walking forward/ backward, multiple laps - UE on yellow hand floats: side stepping 2 laps - UE on yellow hand floats:  toe/heel raises x20; hip add/abd 2x 10 ; hip flexion /extension 2 x 10 (legs fatigued); marching in place - TrA set with single rainbow hand float pull down to thighs x 2-> moved to 1/2 hollow noodle for improved tolerance -> alternating knee taps to noodle - staggered stance: horiz abdct/ addct with rainbow hand floats on top of water x 10 each; kick board row x 10 each;  - straddling noodle and holding corner, gentle cycling for decompression of spine/LE   [facility] Adult PT Treatment:                                                DATE: 04/23/2024 Re-assessment for recert  Therapeutic Exercise: Trunk rotation supine x 10  Neuromuscular re-ed: Bridges + hip abd iso with blue TB distal thighs 2x10 Quad set --> small range SLR 3 sec x 10 R/L Small range SLR in ER 3 sec x 10 R/L Therapeutic Activity: Sit to stand x 5  Shoulder flexion blue TB btn hands x 10  SASH supine blue TB x 15 R/L x 10 x 2 sets  Dead bug x 10  Standing mini squats x 10  Heel raises x 10  Hip abduction standing leading with heel pause x 10 x 2 sets R/L  Sidelying clamshell blue TB 3 sec x 10 R/L Sidelying reverse clamshell no resistance 3 sec x 10  R/L  [facility] Adult PT Treatment:                                                DATE: 04/03/2024 Therapeutic Exercise: NuStep L6 x 7 min Trunk rotation supine x 10  Neuromuscular re-ed: Supine hooklying hip abd green TB 3 sec x 10 R/L Bridges + hip abd iso with green TB distal thighs 2x10 Quad set --> small range SLR 3 sec x 10 R/L Small range SLR in ER 3 sec x 10 R/L Heel slides with orange physio ball x 10   Therapeutic Activity: Standing mini wall squat 5-10 sec x 5 reps Shoulder flexion green TB btn hands x 10  SASH supine green TB x 15 R/L x 10 x 2 sets   Dead bug x 10  Standing mini squats x 10  Heel raises x 10  Hip abduction standing leading with heel pause x 10 x 2 sets R/L  Sidelying clamshell green TB 3 sec x 10 R/L Sidelying reverse clamshell no resistance 3 sec x 10  R/L   PATIENT EDUCATION:  Education details: intro to aquatic therapy  Person educated: Patient Education method: Explanation, Demonstration, Tactile cues, Verbal cues, Education comprehension: verbalized understanding, returned demonstration, verbal cues required, tactile cues required, and needs further education  HOME EXERCISE PROGRAM: Access Code: [email or web address] URL: [email or web address] Date: 03/28/2024 Prepared by: Celyn Holt  Exercises - Supine March  - 2 x daily - 7 x weekly - 1-2 sets - 3 reps - 2 sec  hold - Supine Lower Trunk Rotation  - 2 x daily - 7 x weekly - 1 sets - 3-5 reps - 20-30 sec  hold - Sidelying Hip Abduction  - 1 x daily - 7 x weekly - 3 sets - 10 reps - 3-5 sec  hold - Clamshell  - 2 x daily - 7 x weekly - 1 sets - 10 reps - 2-3 sec  hold - Sidelying Reverse Clamshell  - 2 x daily - 7 x weekly - 1-2 sets - 10 reps - 2-3 sec  hold - Supine Diaphragmatic Breathing  - 2 x daily - 7 x weekly - 1 sets - 10 reps - 4-6 sec  hold - Supine Quad Set  - 2 x daily - 7 x weekly - 1 sets - 10 reps - 3 sec  hold - Small Range Straight Leg Raise  - 2 x daily - 7 x weekly - 1 sets - 10 reps - 5 sec  hold - Hooklying Hamstring Stretch with Strap  - 2 x daily - 7 x weekly - 1 sets - 3 reps - 30 sec  hold - Supine Alternating Shoulder Flexion  - 2 x daily - 7 x weekly - 1-2 sets - 10 reps - 2 sec  hold - Dead Bug  - 1 x daily - 7 x weekly - 1-2 sets - 10 reps - 2 sec  hold - Seated Shoulder Diagonal Pulls with Resistance  - 1 x daily - 7 x weekly - 1-2 sets - 10 reps - 3 sec  hold - Wall Quarter Squat  - 2 x daily - 7 x weekly - 1-2 sets - 10 reps - 5-10 sec  hold - Standing Hip Abduction with Counter Support  - 1 x daily - 7 x weekly - 2-3 sets  - 10 reps - 2-3 sec  hold  AQUATIC Access Code: [email or web address] URL: [email or web address] Date: 05/23/2024 Prepared by: [facility] - [address]  ASSESSMENT:  CLINICAL IMPRESSION: Pt reported some increase in knee pain with marching, but otherwise tolerated session well. Back pain reduced when suspended on noodle in corner.  Pt has been issued and instructed on the aquatic HEP.  Pt verbalized readiness to d/c to aquatic HEP. Pt to call or contact therapist if any further questions. Encouraged pt to join pool and begin aquatic HEP for continued relief of pain. Pt partially met her goals.     EVAL: Patient is a 63 y.o. female who was seen today for physical therapy evaluation and treatment for R sided LBP; chronic R knee pain. Patient reports that she fell back into her coffee tale after a nap 02/18/24 striking her R LB and posterior hip. She has had severe pain in the R back and hip since that time. She reports chronic R knee pain in the past 5 years. She has a history of L knee surgery ? Date and continues to have limited ROM and strength L LE. Patient has forward flexed posture and alignment; difficulty with all transfers and transitional movements; limited trunk and LE mobility/ROM; decreased core and LE strength; abnormal and unsafe gait pattern with cane that was too tall for her(adjusted today and pt was instructed in proper three point gait pattern). Patient has pain limiting all functional activities. She will benefit from PT  to address problems identified.    GOALS: Goals reviewed with patient? Yes  SHORT TERM GOALS: Target date: 03/27/2024  Independent in initial HEP  Baseline: Goal status: met  2.  Safe gait for home and short distances of 50-100 ft with appropriate assistive device  Baseline:  Goal  status: met  3.  Decrease pain by 25-50% allowing patient to increase normal activities  Baseline:  Goal status: met    LONG TERM GOALS: Target date:  04/24/2024  Increase trunk ROM to 50-70% with minimal to no pain  Baseline:  Goal status: on going   2.  Patient reports ability to sit for 30 min and stand for 10 min without increased pain  Baseline:  Goal status: met  3.  Increased ROM bilt knee extension to (-)10 degrees and flexion to 100 deg R and 90 deg L  Baseline:  Goal status: on going   4.  Increase strength to 4/5 to 4+/5 bilat LE's  Baseline:  Goal status: partially met   5.  Independent in HEP including aquatic program as indicated  Baseline:  Goal status: on going -04/26/24  6.  Improve modified Owestry score by 10-15 points  Baseline: 37/50; 74%  04/03/24: 24/50; 48% Goal status: met   PLAN:  PT FREQUENCY: 2x/week  PT DURATION: 8 weeks  PLANNED INTERVENTIONS: 97110-Therapeutic exercises, 97530- Therapeutic activity, 97112- Neuromuscular re-education, 97535- Self Care, [id number]- Manual therapy, [phone]- Gait training, [phone]- Aquatic Therapy, Patient/Family education, Balance training, Stair training, Taping, Dry Needling, and Joint mobilization.  PLAN FOR NEXT SESSION: review and progress exercises; continue with spine care and ergonomic education; core strengthening and stabilization; LE strengthening;  Delon Aquas, PTA 05/23/24 11:57 AM [facility] [address] [place], [place], [address] Phone: [phone]   Fax:  [phone]

## 2024-05-25 DIAGNOSIS — M9901 Segmental and somatic dysfunction of cervical region: Secondary | ICD-10-CM | POA: Diagnosis not present

## 2024-05-28 ENCOUNTER — Ambulatory Visit: Admitting: Sports Medicine

## 2024-05-28 DIAGNOSIS — M170 Bilateral primary osteoarthritis of knee: Secondary | ICD-10-CM | POA: Diagnosis not present

## 2024-05-28 MED ORDER — IBUPROFEN 800 MG PO TABS: 800.0000 mg | ORAL_TABLET | Freq: Three times a day (TID) | ORAL | 3 refills | Status: AC | PRN

## 2024-05-28 NOTE — Assessment & Plan Note (Signed)
 Very pleasant 63 year old female, end-stage bilateral knee osteoarthritis, she has failed physical therapy, oral analgesics, we injected her knee at the last visit, and got updated x-rays. X-rays did show end-stage bone-on-bone osteoarthritis, steroid injection was not sufficiently efficacious. At this point she is understanding that she needs an arthroplasty, she will let me know which surgeon she would like me to refer her to. Adding some additional ibuprofen  800 3 times daily in the meantime.

## 2024-05-28 NOTE — Progress Notes (Signed)
    Procedures performed today:    None.  Independent interpretation of notes and tests performed by another provider:   None.  Brief History, Exam, Impression, and Recommendations:    Primary osteoarthritis of both knees Very pleasant 63 year old female, end-stage bilateral knee osteoarthritis, she has failed physical therapy, oral analgesics, we injected her knee at the last visit, and got updated x-rays. X-rays did show end-stage bone-on-bone osteoarthritis, steroid injection was not sufficiently efficacious. At this point she is understanding that she needs an arthroplasty, she will let me know which surgeon she would like me to refer her to. Adding some additional ibuprofen  800 3 times daily in the meantime.    ____________________________________________ Debby PARAS. Curtis, M.D., ABFM., CAQSM., AME. Primary Care and Sports Medicine [facility]  Adjunct Professor of [facility] [employer] [facility] [facility] Medicine  [employer]  Restaurant manager, fast food

## 2024-06-08 DIAGNOSIS — M9912 Subluxation complex (vertebral) of thoracic region: Secondary | ICD-10-CM | POA: Diagnosis not present

## 2024-07-04 DIAGNOSIS — K5900 Constipation, unspecified: Secondary | ICD-10-CM | POA: Diagnosis not present

## 2024-07-04 DIAGNOSIS — R109 Unspecified abdominal pain: Secondary | ICD-10-CM | POA: Diagnosis not present

## 2024-07-04 DIAGNOSIS — K7689 Other specified diseases of liver: Secondary | ICD-10-CM | POA: Diagnosis not present

## 2024-07-04 NOTE — Progress Notes (Signed)
 [address] [place] [address] [phone]  Follow-up   Subjective   Patient ID:  Carol Franklin is a 63 y.o. (DOB [birth year]) female.   CC:     Patient presents with  . Abdominal Pain  . Fatigue     HPI:   Carol Franklin is a 63 y.o. (DOB August 23, 1961) female with a history of class II obesity (BMI 35), hyperlipidemia who comes into GI clinic for further evaluation of intermittent generalized abdominal pain.   The patient states that she has had intermittent issues with generalized and nonspecific abdominal pain over several months.  Recently this pain began so intense that it prompted her to go to the emergency room of which she underwent extensive workup including a CT of the abdomen pelvis which did not show any acute process but did note some hepatic steatosis as well as a liver cyst.  It was recommended that she have a follow-up ultrasound in about 6 months time.  Patient did not have any elevated LFTs.  No evidence of diverticulitis was seen on CT.  No other acute process was noted.  Patient states that she thinks her bowels are regular.  Does not any blood in her stools.  Last colonoscopy was in 2016 and only notable for diverticulosis.  Was recommended 10-year recall.  Currently today the patient states that she feels fine denies any issues with nausea vomiting hematemesis fevers or chills or rashes.  Does note that she will have issues with indigestion and reflux-like symptoms when she eats too much or eats too late.  She admits that she has been under a lot of stress since her husband recently got diagnosed with cancer and she has had 2 care for him as well as being in the hospital multiple times.  States that she has been eating poorly and not been able to look after herself.  Feels like a lot of her symptoms are related to stress.  LOV: 11/10/23  Interval History:  Since last clinic visit, the patient notes that she is doing okay but still very fatigued and has  intermittent abdominal pain.  States that she is still under a lot of tremendous stress and being a caregiver for her husband who is ill.  Notes that she had some irregularity in her bowels and has been more constipated.  Was not taking the Protonix as a trial but only took it for a week or so and stated that ultimately she is not a medicine person.  Does not take anything proactive for her bowels.  No acute complaints, states that she feels a lot of this is due to stress.  Review of Systems: A 14-point ROS was performed and is negative unless otherwise stated in the HPI.   History reviewed. No pertinent past medical history.  Past Surgical History:  Procedure Laterality Date  . Cesarean section    . Colonoscopy    . Knee arthroscopy      Medications: Medications Taking[1]   Allergies[2]   Objective   BP 119/81 (BP Location: Left Upper Arm, Patient Position: Sitting)   Pulse 110   Temp 98.2 F (36.8 C) (Oral)   Ht 5' 1 (1.549 m)   Wt 179 lb (81.2 kg)   BMI 33.82 kg/m   General:  Alert and oriented, no acute distress Respiratory:  Clear to auscultation bilaterally. No accessory muscle use.  CV:  Regular rhythm, normal rate, no murmurs, rubs, or gallops Abd:  Good bowel sounds, soft, nontender,  no masses, no hepatosplenomegaly Musculoskeletal/Ext:  Gait normal, No clubbing, cyanosis, or edema.  Psychiatric: Judgment and insight seem normal.  Does not appear depressed, anxious or agitated.    Data Reviewed     Assessment   1. Constipation, unspecified constipation type   2. Abdominal pain, unspecified abdominal location   3. Liver cyst      Plan   Carol Franklin was seen today for abdominal pain and fatigue.  Diagnoses and all orders for this visit:  Constipation, unspecified constipation type  Abdominal pain, unspecified abdominal location  Liver cyst -     US  RUQ; Future  Other orders -     polyethylene glycol (MIRALAX) 17 g packet; Take 120 mLs (17 g dose) by  mouth daily.     Discussion and Summary:  Carol Franklin comes into GI clinic for further evaluation and management of intermittent abdominal pain and dyspepsia in the setting of constipation as well as external stressors.  I informed the patient that if her symptoms are very problematic we could continue with empiric trial of certain medications versus diagnostic visualization, however I agreed with her that I felt a lot of her symptoms were likely manifestations of caregiver burden and stress.  She agreed with me.  I informed her that it might be worthwhile taking a proactive bowel regimen and that ultimately taking care of herself was ultimately can be benefit for whoever she was caring for.  She is not due for a recall screening colonoscopy until next year and I stated that she is due for a repeat ultrasound that evaluated large but benign appearing cyst.  We will order for this for her today.  We will see the patient back in 9 months time or she will be due for scheduling her recall colonoscopy.  Patient agreeable to the plan approach and no further questions or concerns.  Follow up in about 9 months (around 04/03/2025) for follow up.  Patient Instructions  We recommend that you improve the frequency of your bowels with MiraLAX 1 capful at least 2-3 times per week to improve the regularity of your bowels.  Overall we feel that stress is likely adding to a lot of your symptoms and continue to focus on wellness and providing time to take care of yourself.  You are due to have your liver cyst rechecked with an ultrasound to ensure stability.  We will order this for you today.    Patient's Medications       * Accurate as of July 04, 2024 10:29 AM. Reflects encounter med changes as of last refresh          New Prescriptions      Instructions  polyethylene glycol 17 g packet Commonly known as: MIRALAX Started by: Venetia Molt, MD  17 g, Oral, Daily       Continued Medications       Instructions  ibuprofen  800 mg tablet Commonly known as: ADVIL ,MOTRIN   800 mg, Oral, Every 8 hours as needed   meloxicam  15 mg tablet Commonly known as: MOBIC   15 mg, Daily   methocarbamol 500 mg tablet Commonly known as: ROBAXIN  500 mg, Oral, 3 times a day as needed   pantoprazole sodium 40 mg tablet Commonly known as: PROTONIX  40 mg, Oral, Daily                   [1] Outpatient Medications Marked as Taking for the 07/04/24 encounter (Office Visit) with Venetia Molt, MD  Medication  Sig Dispense Refill  . ibuprofen  (ADVIL ,MOTRIN ) 800 mg tablet Take one tablet (800 mg dose) by mouth every 8 (eight) hours as needed for Pain. 30 tablet 0  [2] Allergies Allergen Reactions  . Keflex [Cephalexin] Hives  . Naproxen Anxiety    Increases heart rate/makes her feel faint  *Some images could not be shown.

## 2024-07-04 NOTE — Progress Notes (Signed)
 AVS printed

## 2024-07-08 DIAGNOSIS — M9914 Subluxation complex (vertebral) of sacral region: Secondary | ICD-10-CM | POA: Diagnosis not present

## 2024-07-08 DIAGNOSIS — M9913 Subluxation complex (vertebral) of lumbar region: Secondary | ICD-10-CM | POA: Diagnosis not present

## 2024-07-08 DIAGNOSIS — M9915 Subluxation complex (vertebral) of pelvic region: Secondary | ICD-10-CM | POA: Diagnosis not present

## 2024-07-08 DIAGNOSIS — M546 Pain in thoracic spine: Secondary | ICD-10-CM | POA: Diagnosis not present

## 2024-07-09 ENCOUNTER — Encounter: Payer: Self-pay | Admitting: Sports Medicine

## 2024-07-09 NOTE — Progress Notes (Deleted)
   Cardiology Office Note    Date:  07/11/2024  ID:  Carol Franklin, DOB 15-Jul-1961, MRN [id number] PCP:  Carol Mini, NP  Cardiologist:  Carol SAUNDERS Madireddy, MD  Electrophysiologist:  None   Chief Complaint: ***  History of Present Illness: .    Carol Franklin is a 63 y.o. female with visit-pertinent history of obesity, hyperlipidemia followed by PCP, mild hepatic steatosis, liver cyst, GERD, prediabetes, iRBBB seen for follow-up. Established care 12/2023 for abnormal EKG showing iRBBB. Dr. Liborio had discussed how this could be a normal variant or a benign finding in people with obesity, sleep apnea, pulmonary issues. In the absence of symptoms she was not felt to require further workup, advised to f/u PRN.  Labs?   Palpitations***? Incomplete RBBB Hyperlipidemia  Labwork independently reviewed: 09/2023 Hgb 13.9, plt 505, K 4.1, Cr 0.68, LFTs, Mg 2.0 02/2023 LDL 124, trig 113  ROS: .    Please see the history of present illness. Otherwise, review of systems is positive for ***.  All other systems are reviewed and otherwise negative.  Studies Reviewed: [name]    EKG:  EKG is ordered today, personally reviewed, demonstrating ***  CV Studies: Cardiac studies reviewed are outlined and summarized above. Otherwise please see EMR for full report.   Current Reported Medications:.    No outpatient medications have been marked as taking for the 07/11/24 encounter (Appointment) with Carol Spada N, PA-C.    Physical Exam:    VS:  LMP 11/17/2015    Wt Readings from Last 3 Encounters:  02/26/24 192 lb 1.9 oz (87.1 kg)  12/14/23 198 lb (89.8 kg)  10/20/23 199 lb 0.6 oz (90.3 kg)    GEN: Well nourished, well developed in no acute distress NECK: No JVD; No carotid bruits CARDIAC: ***RRR, no murmurs, rubs, gallops RESPIRATORY:  Clear to auscultation without rales, wheezing or rhonchi  ABDOMEN: Soft, non-tender, non-distended EXTREMITIES:  No edema; No acute deformity   Asessement and  Plan:.     ***     Disposition: F/u with ***  Signed, Carol Barsch Franklin Baldo Hufnagle, PA-C

## 2024-07-11 ENCOUNTER — Ambulatory Visit: Attending: Physician Assistant | Admitting: Physician Assistant

## 2024-07-11 DIAGNOSIS — E785 Hyperlipidemia, unspecified: Secondary | ICD-10-CM

## 2024-07-11 DIAGNOSIS — I4510 Unspecified right bundle-branch block: Secondary | ICD-10-CM

## 2024-07-11 DIAGNOSIS — M1711 Unilateral primary osteoarthritis, right knee: Secondary | ICD-10-CM | POA: Diagnosis not present

## 2024-07-11 DIAGNOSIS — R002 Palpitations: Secondary | ICD-10-CM

## 2024-07-15 ENCOUNTER — Telehealth: Payer: Self-pay

## 2024-07-15 DIAGNOSIS — K7689 Other specified diseases of liver: Secondary | ICD-10-CM | POA: Diagnosis not present

## 2024-07-15 NOTE — Telephone Encounter (Signed)
 I s/w the pt about setting up a tele preop appt. Pt tells me that she has been having some fluttering with her heart, as well as her HR have running high. Pt preferred to be seen in the office. No appts open in [facility] office where pt is generally seen, but is agreeable to [employer] [facility]. Office, appt 07/23/24 with Jackee Alberts, NP at 8:50 am.   Pt said she called last week to cancel her appt as she could not make the appt but could never get through to anyone to do so.

## 2024-07-15 NOTE — Telephone Encounter (Signed)
   Name: Carol Franklin  DOB: [birth year]  MRN: [id number]  Primary Cardiologist: Alean SAUNDERS Madireddy, MD   Preoperative team, please contact this patient and set up a phone call appointment for further preoperative risk assessment. Please obtain consent and complete medication review. Thank you for your help.  I confirm that guidance regarding antiplatelet and oral anticoagulation therapy has been completed and, if necessary, noted below.  None requested  I also confirmed the patient resides in the state of [place] . As per [facility] Medical Board telemedicine laws, the patient must reside in the state in which the provider is licensed.   [name], NP 07/15/2024, 1:27 PM [facility]

## 2024-07-15 NOTE — Telephone Encounter (Signed)
   Pre-operative Risk Assessment    Patient Name: Carol Franklin  DOB: 1960/11/15 MRN: [id number]   Date of last office visit: 12/14/23 Date of next office visit: N/A   Request for Surgical Clearance    Procedure:  Right total knee arthoplasty  Date of Surgery:  Clearance TBD                                Surgeon:  Dr. Toribio Higashi Surgeon's Group or Practice Name:  [facility]  Phone number:  [phone] ext [phone] Fax number:  [phone]   Type of Clearance Requested:   - Medical    Type of Anesthesia:  Spinal   Additional requests/questions:    SignedCalvert Pouch   07/15/2024, 1:02 PM

## 2024-07-22 NOTE — Progress Notes (Signed)
 Cardiology Office Note    Patient Name: Carol Franklin Date of Encounter: 07/22/2024  Primary Care Provider:  Willo Mini, NP Primary Cardiologist:  Alean SAUNDERS Madireddy, MD Primary Electrophysiologist: None   Past Medical History    Past Medical History:  Diagnosis Date   Abnormal EKG    BMI 35.0-35.9,adult 01/06/2012   Chronic pain of both knees 01/13/2020   Chronic right-sided low back pain with right-sided sciatica 10/21/2020   Eczema 02/14/2020   Essential thrombocytosis (HCC) 01/13/2012   Fatigue 10/21/2020   Hyperlipidemia 01/13/2012   Hypertension    Intertrigo    Loud snoring 10/21/2020   Menopausal hot flushes 09/17/2014   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/2012   Osteopenia 12/04/2015   Person encountering health services to consult on behalf of another person 03/03/2023   Post-operative nausea and vomiting    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 female with a PMH of HLD, prediabetes, incomplete RBBB, GERD, obesity who presents today for preop clearance.  Carol Franklin was seen initially by Dr.Medireddy on 12/14/2023 for abnormal EKG.  She was seen in the ED and noted to have incomplete RBBB on exam.  During her visit she denied any ongoing cardiac symptoms.  There was no further cardiac workup necessary at that time.  She was scheduled for televisit but noted increased heart rate with occasional palpitations.  Patient denies chest pain, palpitations, dyspnea, PND, orthopnea, nausea, vomiting, dizziness, syncope, edema, weight gain, or early satiety.   Discussed the use of AI scribe software for clinical note transcription with the patient, who gave verbal consent to proceed.  History of Present Illness    ***Notes: Pending right total knee arthroplasty   Review of Systems  Please see the history of present illness.    All other systems reviewed and are otherwise negative  except as noted above.  Physical Exam    Wt Readings from Last 3 Encounters:  02/26/24 192 lb 1.9 oz (87.1 kg)  12/14/23 198 lb (89.8 kg)  10/20/23 199 lb 0.6 oz (90.3 kg)   CD:Uyzmz were no vitals filed for this visit.,There is no height or weight on file to calculate BMI. GEN: Well nourished, well developed in no acute distress Neck: No JVD; No carotid bruits Pulmonary: Clear to auscultation without rales, wheezing or rhonchi  Cardiovascular: Normal rate. Regular rhythm. Normal S1. Normal S2.   Murmurs: There is no murmur.  ABDOMEN: Soft, non-tender, non-distended EXTREMITIES:  No edema; No deformity   EKG/LABS/ Recent Cardiac Studies   ECG personally reviewed by me today - ***  Risk Assessment/Calculations:   {Does this patient have ATRIAL FIBRILLATION?[phone]}      Lab Results  Component Value Date   WBC 8.9 03/03/2023   HGB 13.5 03/03/2023   HCT 42.3 03/03/2023   MCV 89.6 03/03/2023   PLT 541 (H) 03/03/2023   Lab Results  Component Value Date   CREATININE 0.73 03/03/2023   BUN 13 03/03/2023   NA 142 03/03/2023   K 4.7 03/03/2023   CL 103 03/03/2023   CO2 31 03/03/2023   Lab Results  Component Value Date   CHOL 206 (H) 03/03/2023   HDL 60 03/03/2023   LDLCALC 124 (H) 03/03/2023   TRIG 113 03/03/2023   CHOLHDL 3.4 03/03/2023    Lab Results  Component Value Date   HGBA1C 6.1 (H) 03/03/2023   Assessment & Plan  Assessment and Plan Assessment & Plan     1.  Preoperative clearance: - Patient's RCRI score is 0.9%  2.  Palpitations  3.  History of RBBB  4.***      Disposition: Follow-up with Alean SAUNDERS Madireddy, MD or APP in *** months {Are you ordering a CV Procedure (e.g. stress test, cath, DCCV, TEE, etc)?   Press F2        :[id number]}   Signed, Wyn Raddle, Carol Shove, NP 07/22/2024, 5:56 PM [facility]

## 2024-07-23 ENCOUNTER — Ambulatory Visit: Attending: Nurse Practitioner | Admitting: Nurse Practitioner

## 2024-07-23 ENCOUNTER — Ambulatory Visit: Payer: Self-pay | Admitting: Nurse Practitioner

## 2024-07-23 ENCOUNTER — Encounter: Payer: Self-pay | Admitting: Nurse Practitioner

## 2024-07-23 ENCOUNTER — Other Ambulatory Visit: Payer: Self-pay | Admitting: Nurse Practitioner

## 2024-07-23 ENCOUNTER — Ambulatory Visit

## 2024-07-23 VITALS — BP 110/70 | HR 105 | Ht 61.0 in | Wt 179.2 lb

## 2024-07-23 DIAGNOSIS — R002 Palpitations: Secondary | ICD-10-CM

## 2024-07-23 DIAGNOSIS — Z01810 Encounter for preprocedural cardiovascular examination: Secondary | ICD-10-CM | POA: Diagnosis not present

## 2024-07-23 DIAGNOSIS — M79604 Pain in right leg: Secondary | ICD-10-CM

## 2024-07-23 DIAGNOSIS — R600 Localized edema: Secondary | ICD-10-CM

## 2024-07-23 DIAGNOSIS — I4510 Unspecified right bundle-branch block: Secondary | ICD-10-CM

## 2024-07-23 DIAGNOSIS — R7989 Other specified abnormal findings of blood chemistry: Secondary | ICD-10-CM | POA: Diagnosis not present

## 2024-07-23 DIAGNOSIS — I491 Atrial premature depolarization: Secondary | ICD-10-CM

## 2024-07-23 LAB — CBC
Hematocrit: 34.6 % (ref 34.0–46.6)
Hemoglobin: 11.3 g/dL (ref 11.1–15.9)
MCH: 28.9 pg (ref 26.6–33.0)
MCHC: 32.7 g/dL (ref 31.5–35.7)
MCV: 89 fL (ref 79–97)
Platelets: 421 x10E3/uL (ref 150–450)
RBC: 3.91 x10E6/uL (ref 3.77–5.28)
RDW: 12.5 % (ref 11.7–15.4)
WBC: 9 x10E3/uL (ref 3.4–10.8)

## 2024-07-23 LAB — COMPREHENSIVE METABOLIC PANEL WITH GFR
ALT: 22 IU/L (ref 0–35)
AST: 24 IU/L (ref 15–59)
Albumin: 3.5 g/dL — ABNORMAL LOW (ref 3.9–4.9)
Alkaline Phosphatase: 98 IU/L (ref 49–135)
BUN/Creatinine Ratio: 23 (ref 12–28)
BUN: 13 mg/dL (ref 8–27)
Bilirubin Total: 0.5 mg/dL (ref 0.0–1.2)
CO2: 24 mmol/L (ref 20–29)
Calcium: 9.5 mg/dL (ref 8.7–10.3)
Chloride: 106 mmol/L (ref 96–106)
Creatinine, Ser: 0.56 mg/dL — ABNORMAL LOW (ref 0.57–1.00)
Globulin, Total: 2.6 g/dL (ref 1.5–4.5)
Glucose: 96 mg/dL (ref 70–99)
Potassium: 3.8 mmol/L (ref 3.5–5.2)
Sodium: 140 mmol/L (ref 134–144)
Total Protein: 6.1 g/dL (ref 6.0–8.5)
eGFR: 103 mL/min/1.73 (ref >59)

## 2024-07-23 LAB — MAGNESIUM: Magnesium: 1.8 mg/dL (ref 1.6–2.3)

## 2024-07-23 LAB — D-DIMER, QUANTITATIVE: D-DIMER: 1.51 mg{FEU}/L — ABNORMAL HIGH (ref 0.00–0.49)

## 2024-07-23 NOTE — Patient Instructions (Addendum)
 Medication Instructions:  Your physician recommends that you continue on your current medications as directed. Please refer to the Current Medication list given to you today.` *If you need a refill on your cardiac medications before your next appointment, please call your pharmacy*  Lab Work: TODAY-STAT: D-DIMER, TSH, CBC, CMET, & MAG If you have labs (blood work) drawn today and your tests are completely normal, you will receive your results only by: MyChart Message (if you have MyChart) OR A paper copy in the mail If you have any lab test that is abnormal or we need to change your treatment, we will call you to review the results.  Testing/Procedures: Your physician has recommended that you wear an 30 DAY event monitor. Event monitors are medical devices that record the heart's electrical activity. Doctors most often us  these monitors to diagnose arrhythmias. Arrhythmias are problems with the speed or rhythm of the heartbeat. The monitor is a small, portable device. You can wear one while you do your normal daily activities. This is usually used to diagnose what is causing palpitations/syncope (passing out).  Your physician has requested that you have a STAT lower extremity venous duplex. This test is an ultrasound of the veins in the legs or arms. It looks at venous blood flow that carries blood from the heart to the legs. Allow one hour for a Lower Venous exam. There are no restrictions or special instructions.  Please note: We ask at that you not bring children with you during ultrasound (echo/ vascular) testing. Due to room size and safety concerns, children are not allowed in the ultrasound rooms during exams. Our front office staff cannot provide observation of children in our lobby area while testing is being conducted. An adult accompanying a patient to their appointment will only be allowed in the ultrasound room at the discretion of the ultrasound technician under special circumstances.  We apologize for any inconvenience.  Your physician has requested that you have an echocardiogram. Echocardiography is a painless test that uses sound waves to create images of your heart. It provides your doctor with information about the size and shape of your heart and how well your heart's chambers and valves are working. This procedure takes approximately one hour. There are no restrictions for this procedure. Please do NOT wear cologne, perfume, aftershave, or lotions (deodorant is allowed). Please arrive 15 minutes prior to your appointment time.  Please note: We ask at that you not bring children with you during ultrasound (echo/ vascular) testing. Due to room size and safety concerns, children are not allowed in the ultrasound rooms during exams. Our front office staff cannot provide observation of children in our lobby area while testing is being conducted. An adult accompanying a patient to their appointment will only be allowed in the ultrasound room at the discretion of the ultrasound technician under special circumstances. We apologize for any inconvenience.  Follow-Up: At [facility], you and your health needs are our priority.  As part of our continuing mission to provide you with exceptional heart care, our providers are all part of one team.  This team includes your primary Cardiologist (physician) and Advanced Practice Providers or APPs (Physician Assistants and Nurse Practitioners) who all work together to provide you with the care you need, when you need it.  Your next appointment:   2 month(s)  Provider:   Alean SAUNDERS Madireddy, MD or APP   We recommend signing up for the patient portal called MyChart.  Sign up information  is provided on this After Visit Summary.  MyChart is used to connect with patients for Virtual Visits (Telemedicine).  Patients are able to view lab/test results, encounter notes, upcoming appointments, etc.  Non-urgent messages can be sent to your  provider as well.   To learn more about what you can do with MyChart, go to [email or web address].   Other Instructions

## 2024-07-24 ENCOUNTER — Ambulatory Visit (HOSPITAL_COMMUNITY)

## 2024-07-24 ENCOUNTER — Other Ambulatory Visit: Payer: Self-pay

## 2024-07-24 DIAGNOSIS — R7989 Other specified abnormal findings of blood chemistry: Secondary | ICD-10-CM

## 2024-07-24 LAB — TSH: TSH: <0.005 u[IU]/mL — ABNORMAL LOW (ref 0.450–4.500)

## 2024-07-25 DIAGNOSIS — E0590 Thyrotoxicosis, unspecified without thyrotoxic crisis or storm: Secondary | ICD-10-CM | POA: Diagnosis not present

## 2024-07-25 DIAGNOSIS — R7989 Other specified abnormal findings of blood chemistry: Secondary | ICD-10-CM | POA: Diagnosis not present

## 2024-07-26 ENCOUNTER — Ambulatory Visit: Payer: Self-pay | Admitting: Nurse Practitioner

## 2024-07-26 DIAGNOSIS — R7989 Other specified abnormal findings of blood chemistry: Secondary | ICD-10-CM

## 2024-07-26 LAB — T4, FREE: Free T4: 4.16 ng/dL — ABNORMAL HIGH (ref 0.82–1.77)

## 2024-07-26 LAB — T3, FREE: T3, Free: 15 pg/mL — ABNORMAL HIGH (ref 2.0–4.4)

## 2024-07-26 MED ORDER — PROPRANOLOL HCL 10 MG PO TABS: 10.0000 mg | ORAL_TABLET | Freq: Four times a day (QID) | ORAL | 1 refills | Status: DC

## 2024-07-30 ENCOUNTER — Telehealth: Payer: Self-pay

## 2024-07-30 ENCOUNTER — Telehealth: Payer: Self-pay | Admitting: Nurse Practitioner

## 2024-07-30 MED ORDER — METOPROLOL TARTRATE 25 MG PO TABS: 25.0000 mg | ORAL_TABLET | Freq: Two times a day (BID) | ORAL | 1 refills | Status: DC

## 2024-07-30 MED ORDER — APIXABAN 5 MG PO TABS: 5.0000 mg | ORAL_TABLET | Freq: Two times a day (BID) | ORAL | 3 refills | Status: DC

## 2024-07-30 NOTE — Telephone Encounter (Signed)
 [facility] report reviewed showing new onset atrial fibrillation with RVR and heart rate of 127 at 7:29 AM. Please contact patient and advise her to discontinue propranolol  10 mg twice daily and start metoprolol  25 mg twice daily.  Please also start Eliquis  5 mg twice daily and advised patient to go to the ED if she develops symptoms such as dizziness and chest pain while in atrial fibrillation.  Please advise patient to continue wearing 30-day monitor and to contact us  if she has any further questions.  Jackee Alberts, NP

## 2024-07-30 NOTE — Addendum Note (Signed)
 Addended by: SEBASTIAN JANESE GRADE on: 07/30/2024 04:54 PM   Modules accepted: Orders

## 2024-07-30 NOTE — Telephone Encounter (Signed)
 Jeannie from [facility] is calling to report critical heart monitor results. Hung up before I could get nurse on the phone

## 2024-07-30 NOTE — Telephone Encounter (Addendum)
 Gave patient Carol Franklin recommendations and she voiced understanding.   She states her heart felt weird a couple times but it did not last long and she is ok. Advised patient to go to emergency room if she has symptoms. Patient states she does not feel like its necessary for her to go to the emergency room.   Patient states her pcp is not a good one and she wants to know if we could put in a referral for her to see a endocrinologist for her thyroid.  Patient asked if she needs to continue wearing the monitor and I explained to her that she needs to wear the monitor for 30 days.   Metoprolol  and Eliquis  have been sent to the pharmacy.

## 2024-07-30 NOTE — Telephone Encounter (Signed)
 Spoke with rep from [place] [facility]. Advised that we do not have an account with [facility] and the monitor was placed by Jackee Alberts, NP at [facility]. She is faxing the report. At 7:29AM there was new onset A-Fib, no symptoms reported.

## 2024-07-30 NOTE — Telephone Encounter (Signed)
 [facility] is returning call. She apologizes that the call disconnected.  Phone#: [phone] (opt 1)

## 2024-08-05 ENCOUNTER — Ambulatory Visit: Admitting: Medical-Surgical

## 2024-08-05 ENCOUNTER — Encounter: Payer: Self-pay | Admitting: Medical-Surgical

## 2024-08-05 VITALS — BP 115/68 | HR 87 | Resp 20 | Ht 61.0 in | Wt 174.0 lb

## 2024-08-05 DIAGNOSIS — E0590 Thyrotoxicosis, unspecified without thyrotoxic crisis or storm: Secondary | ICD-10-CM | POA: Diagnosis not present

## 2024-08-05 DIAGNOSIS — Z01818 Encounter for other preprocedural examination: Secondary | ICD-10-CM

## 2024-08-05 NOTE — Progress Notes (Signed)
        Established patient visit   History of Present Illness   Discussed the use of AI scribe software for clinical note transcription with the patient, who gave verbal consent to proceed.  History of Present Illness   Jamille Carol Franklin is a 63 year old female who presents with persistent weakness and fatigue.  Generalized weakness and fatigue - Persistent weakness and fatigue with fluctuating severity - Symptoms ongoing for an extended period - Energy levels vary throughout the day, with episodes of exhaustion following activity - Significant impact on daily functioning, requiring extended rest after exertion  Thyroid dysfunction - Significantly abnormal thyroid levels - No current thyroid medication use - Family history of thyroid disorders - Patient attributes weakness and fatigue to thyroid issues  Cardiac symptoms and medication effects - Heart-related symptoms under evaluation with a heart monitor - Previous use of pindolol and propranolol  resulted in sickness and increased weakness - Currently prescribed metoprolol  and Eliquis , but has not started Eliquis  - Dizziness occurs after taking cardiac medication       Physical Exam   Physical Exam Assessment & Plan   Problem List Items Addressed This Visit   None Visit Diagnoses       Preoperative clearance    -  Primary     Hyperthyroidism       Relevant Orders   Ambulatory referral to Endocrinology      Assessment and Plan    Atrial fibrillation Atrial fibrillation with fatigue and weakness. Elevated D-dimer followed by US  of LE, negative for DVT. - Continue metoprolol . - Start Eliquis  for anticoagulation. - Scheduled echocardiogram on August 16, 2024.  Thyroid dysfunction Thyroid dysfunction with abnormal levels and family history. TSH <0.005 with elevated free T4 and free T3 consistent with hyperthyroidism. Levels have not been rechecked. Symptoms include fatigue and weakness. - Place urgent endocrinology  referral to Dr. Hosie in [place] per patient request. - Await endocrinology appointment before repeating thyroid labs.  Fatigue and weakness Likely secondary to thyroid dysfunction and atrial fibrillation, impacting daily activities.  Knee pain Persistent knee pain despite therapy and injection. Orthopedic consultation completed. Pending preoperative clearance to schedule right TKR. - Defer medical clearance for surgery until thyroid and cardiac issues are resolved.   Follow up   Return if symptoms worsen or fail to improve. __________________________________ Zada FREDRIK Palin, DNP, APRN, FNP-BC Primary Care and Sports Medicine [facility] [place]

## 2024-08-07 DIAGNOSIS — E041 Nontoxic single thyroid nodule: Secondary | ICD-10-CM | POA: Diagnosis not present

## 2024-08-07 DIAGNOSIS — I4891 Unspecified atrial fibrillation: Secondary | ICD-10-CM | POA: Diagnosis not present

## 2024-08-07 DIAGNOSIS — E0590 Thyrotoxicosis, unspecified without thyrotoxic crisis or storm: Secondary | ICD-10-CM | POA: Diagnosis not present

## 2024-08-15 ENCOUNTER — Telehealth: Payer: Self-pay | Admitting: Nurse Practitioner

## 2024-08-15 ENCOUNTER — Ambulatory Visit (HOSPITAL_BASED_OUTPATIENT_CLINIC_OR_DEPARTMENT_OTHER)
Admission: RE | Admit: 2024-08-15 | Discharge: 2024-08-15 | Disposition: A | Discharge status: Home / Self Care | Source: Ambulatory Visit | Attending: Nurse Practitioner | Admitting: Nurse Practitioner

## 2024-08-15 ENCOUNTER — Telehealth: Payer: Self-pay

## 2024-08-15 DIAGNOSIS — R002 Palpitations: Secondary | ICD-10-CM | POA: Insufficient documentation

## 2024-08-15 DIAGNOSIS — I4510 Unspecified right bundle-branch block: Secondary | ICD-10-CM | POA: Diagnosis not present

## 2024-08-15 DIAGNOSIS — Z01810 Encounter for preprocedural cardiovascular examination: Secondary | ICD-10-CM | POA: Insufficient documentation

## 2024-08-15 LAB — ECHOCARDIOGRAM COMPLETE
AR max vel: 1.93 cm2
AV Area VTI: 2.08 cm2
AV Area mean vel: 1.83 cm2
AV Mean grad: 7.5 mmHg
AV Peak grad: 13.4 mmHg
Ao pk vel: 1.83 m/s
Area-P 1/2: 5.02 cm2
Calc EF: 63.9 %
MV M vel: 4.32 m/s
MV Peak grad: 74.6 mmHg
S' Lateral: 2.7 cm
Single Plane A2C EF: 61.6 %
Single Plane A4C EF: 66.5 %

## 2024-08-15 NOTE — Telephone Encounter (Signed)
 [facility] calling to report urgent results

## 2024-08-15 NOTE — Telephone Encounter (Signed)
   Cardiac Monitor Alert  Date of alert:  08/15/2024   Patient Name: Carol Franklin  DOB: Apr 05, 1961  MRN: [id number]   [facility] Cardiologist: Alean SAUNDERS Madireddy, MD  [facility] EP:  None    Monitor Information: Cardiac Event Monitor [[employer]]  Reason:  Palpitations, preop clearance  Ordering provider:  Jackee Alberts, NP   Alert Atrial Fibrillation/Flutter This is the 2nd alert for this rhythm.  The patient has no hx of Atrial Fibrillation/Flutter, however monitor has indicated that pt is having episode of a-fib/flutter with RVR on 9/23, at that time pt was started on Eliquis  5mg  BID and Lopressor  25mg  BID.   Anticoagulation medication as of 08/15/2024           apixaban  (ELIQUIS ) 5 MG TABS tablet Take 1 tablet (5 mg total) by mouth 2 (two) times daily.       Next Cardiology Appointment Date:  09/11/24 @ 1:20pm  Provider:  Dr. Liborio (in [facility])  The patient was contacted today.  She is symptomatic.  She reports the following symptoms:  Pt woke up to elevated heart rate states that she didn't feel good with rhythm and rate. Pt states she could feel heart racing. Advised pt that this RN would call back with any recommendations. ED precautions discussed with pt. Pt verbalizes understanding.  Arrhythmia, symptoms and history reviewed with Dr. Verlin.  Plan:  No change in plan at this time.    Other: Will post monitor in Media.   Rihana Kiddy L, RN  08/15/2024 11:11 AM

## 2024-08-15 NOTE — Telephone Encounter (Signed)
 Spoke with pt after receiving 30 day monitor report today. Pt reports that she was feeling funny but since returning home from echo she has taken a nap and just woke up. Advised if she is still feeling funny, rapid heart rate of lightheaded to go to the ED. Pt will call tomorrow if she needs an appointment. Pt verbalized understanding and had no additional questions.

## 2024-08-19 ENCOUNTER — Ambulatory Visit: Attending: Nurse Practitioner

## 2024-08-19 DIAGNOSIS — R002 Palpitations: Secondary | ICD-10-CM

## 2024-08-19 DIAGNOSIS — Z01810 Encounter for preprocedural cardiovascular examination: Secondary | ICD-10-CM

## 2024-08-19 DIAGNOSIS — I4510 Unspecified right bundle-branch block: Secondary | ICD-10-CM

## 2024-08-19 DIAGNOSIS — I491 Atrial premature depolarization: Secondary | ICD-10-CM

## 2024-09-09 DIAGNOSIS — E0590 Thyrotoxicosis, unspecified without thyrotoxic crisis or storm: Secondary | ICD-10-CM | POA: Diagnosis not present

## 2024-09-11 ENCOUNTER — Ambulatory Visit

## 2024-09-11 VITALS — BP 132/80 | HR 70 | Ht 61.0 in | Wt 176.4 lb

## 2024-09-11 DIAGNOSIS — I480 Paroxysmal atrial fibrillation: Secondary | ICD-10-CM

## 2024-09-11 DIAGNOSIS — Z01810 Encounter for preprocedural cardiovascular examination: Secondary | ICD-10-CM | POA: Diagnosis not present

## 2024-09-11 DIAGNOSIS — R002 Palpitations: Secondary | ICD-10-CM | POA: Diagnosis not present

## 2024-09-11 DIAGNOSIS — I4510 Unspecified right bundle-branch block: Secondary | ICD-10-CM | POA: Diagnosis not present

## 2024-09-11 DIAGNOSIS — I491 Atrial premature depolarization: Secondary | ICD-10-CM | POA: Diagnosis not present

## 2024-09-11 HISTORY — DX: Paroxysmal atrial fibrillation: I48.0

## 2024-09-11 MED ORDER — METOPROLOL TARTRATE 25 MG PO TABS: 12.5000 mg | ORAL_TABLET | Freq: Two times a day (BID) | ORAL | 3 refills | Status: AC

## 2024-09-11 NOTE — Assessment & Plan Note (Signed)
 Elective right knee arthroplasty being planned. Functional status somewhat limited due to her knee pain. No chest pain, no heart failure signs or symptoms.  From cardiac standpoint should be okay to proceed with her elective surgery as being planned.  Eliquis  can be held as needed for her surgery.

## 2024-09-11 NOTE — Patient Instructions (Signed)
 Medication Instructions:  Your physician has recommended you make the following change in your medication:   START: Metoprolol  tartrate 12.5 mg two times daily  *If you need a refill on your cardiac medications before your next appointment, please call your pharmacy*  Lab Work: None If you have labs (blood work) drawn today and your tests are completely normal, you will receive your results only by: MyChart Message (if you have MyChart) OR A paper copy in the mail If you have any lab test that is abnormal or we need to change your treatment, we will call you to review the results.  Testing/Procedures: A zio monitor was ordered today. It will remain on for 7 days. You will then return monitor and event diary in provided box. It takes 1-2 weeks for report to be downloaded and returned to us . We will call you with the results. If monitor falls off or has orange flashing light, please call Zio for further instructions.    Follow-Up: At [facility], you and your health needs are our priority.  As part of our continuing mission to provide you with exceptional heart care, our providers are all part of one team.  This team includes your primary Cardiologist (physician) and Advanced Practice Providers or APPs (Physician Assistants and Nurse Practitioners) who all work together to provide you with the care you need, when you need it.  Your next appointment:   3 month(s)  Provider:   Alean Kobus, MD    We recommend signing up for the patient portal called MyChart.  Sign up information is provided on this After Visit Summary.  MyChart is used to connect with patients for Virtual Visits (Telemedicine).  Patients are able to view lab/test results, encounter notes, upcoming appointments, etc.  Non-urgent messages can be sent to your provider as well.   To learn more about what you can do with MyChart, go to [email or web address].   Other Instructions None

## 2024-09-11 NOTE — Assessment & Plan Note (Signed)
 Diagnosed September 2025 on event monitor. 9% A-fib burden. Correlated with underlying hyperthyroidism diagnosed. Improved symptoms once started on methimazole.  CHA2DS2-VASc score 1. For now continue with Eliquis  given paroxysmal nature of A-fib. Once euthyroid status is achieved and no significant A-fib burden, can consider holding Eliquis .  Continue metoprolol  tartrate 12.5 mg twice daily.  Now with improved T3 and T4 levels will reassess with Zio patch for 7 days.  Advised her to obtain lab smart watch such as Apple Watch to monitor heart rhythm at home.

## 2024-09-11 NOTE — Progress Notes (Signed)
 Cardiology Consultation:    Date:  09/11/2024   ID:  Carol Franklin, DOB 08-17-1961, MRN [id number]  PCP:  Willo Mini, NP  Cardiologist:  Alean JONELLE Kobus, MD   Referring MD: Willo Mini, NP   No chief complaint on file.    ASSESSMENT AND PLAN:   Carol Franklin 63 year old woman has history of chronic RBBB, obesity, hyperlipidemia, mild hepatic steatosis, liver cyst, GERD, prediabetes recently and with ongoing symptoms of palpitations 30-day event monitor done September 2025 noted paroxysmal atrial fibrillation with 9% burden: Short 4.5-second conversion pause noted on 1 tracing.  This correlated with a markedly suppressed TSH with elevated T3 and T4 and has been started on treatment with methimazole for goiter with improvement in her T3-T4 levels and her symptoms of palpitations have improved.  Echocardiogram 08/15/2024 shows normal biventricular function with LVEF 55 to 60%, mild MR.  Here for follow-up visit Problem List Items Addressed This Visit     Preop cardiovascular exam   Elective right knee arthroplasty being planned. Functional status somewhat limited due to her knee pain. No chest pain, no heart failure signs or symptoms.  From cardiac standpoint should be okay to proceed with her elective surgery as being planned.  Eliquis  can be held as needed for her surgery.       Paroxysmal atrial fibrillation [facility]) - Primary   Diagnosed September 2025 on event monitor. 9% A-fib burden. Correlated with underlying hyperthyroidism diagnosed. Improved symptoms once started on methimazole.  CHA2DS2-VASc score 1. For now continue with Eliquis  given paroxysmal nature of A-fib. Once euthyroid status is achieved and no significant A-fib burden, can consider holding Eliquis .  Continue metoprolol  tartrate 12.5 mg twice daily.  Now with improved T3 and T4 levels will reassess with Zio patch for 7 days.  Advised her to obtain lab smart watch such as Apple Watch to monitor heart  rhythm at home.      Relevant Medications   metoprolol  tartrate (LOPRESSOR ) 25 MG tablet   Other Relevant Orders   LONG TERM MONITOR (3-14 DAYS)   Return to clinic tentatively in 3 months.   History of Present Illness:    Carol Franklin is a 63 y.o. female who is being seen today for follow-up visit with me. PCP is Willo Mini, NP. Last visit with me in the office was 12/14/2023. She more recently 07/23/2024 had a visit at cardiology offices heart care at [facility] with Jackee Alberts NP for preop evaluation prior to right knee arthroplasty being planned with Dr. Estela.  Pleasant woman here for the visit today accompanied by her husband.  She helps her husband with his ongoing health issues with intracranial tumor.  Has a history of chronic right bundle branch block, obesity, hyperlipidemia, mild hepatic steatosis, liver cyst, GERD, prediabetes. Does not smoke.  Rare social alcohol consumption.  Was being evaluated for preop assessment prior to her elective knee surgery.  Reported palpitations and elevated heart rates and was seen by cardiology team at [facility] 07/23/2024. With reported intermittent spells of fullness in the chest and palpitations associated with lightheadedness and dizzy spells heart monitor was placed.  30-day heart monitor ordered at preop visit by Jackee Alberts NP, for evaluation of palpitations.  Started from 07/28/2024 through 08/26/2024 noted paroxysmal atrial fibrillation/flutter 9% burden, rare ventricular ectopy less than 1%, supraventricular ectopy 1%, 4.5-second pause associated with atrial arrhythmia conversion pause noted on 08/15/2024 at 1807 p.m.    These results were reviewed by ordering provider Jackee  Dick NP on 07/30/2024 and recommended starting Eliquis  5 mg twice daily.  labs 07/23/2024 abnormal TSH suppressed less than 0.005, elevated free T3 and T4.  Subsequently establish care with endocrinologist Dr. Beryl through [facility] health and has been  started on methimazole. Recent repeat lab work from 09/09/2024 shows improvement in T3 and T4 levels, TSH levels still remain suppressed less than 0.005.  She feels since starting methimazole and improvement in T3-T4 levels over the past month and a half her symptoms have improved.  Less frequent palpitations.   Echocardiogram completed 08/15/2024 notes normal biventricular function with LVEF 55 to 60%, mild mitral valve regurgitation, normal diastolic function.  Currently denies any further episodes of chest pain, shortness of breath, orthopnea paroxysmal nocturnal dyspnea. No pedal edema. Syncopal episodes.  Tolerating Eliquis  well. No blood in urine or stools.  Tolerating metoprolol  better at small dose 12.5 mg twice daily.  Functional status limited due to knee pain.  Ambulating better balance is slightly off.  Past Medical History:  Diagnosis Date   Abnormal EKG    BMI 35.0-35.9,adult 01/06/2012   Chronic pain of both knees 01/13/2020   Chronic right-sided low back pain with right-sided sciatica 10/21/2020   Eczema 02/14/2020   Essential thrombocytosis (HCC) 01/13/2012   Fatigue 10/21/2020   Hyperlipidemia 01/13/2012   Hypertension    Hyperthyroidism 08/07/2024   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   Intertrigo    Loud snoring 10/21/2020   Menopausal hot flushes 09/17/2014   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/2012   Osteopenia 12/04/2015   Person encountering health services to consult on behalf of another person 03/03/2023   Post-operative nausea and vomiting    Primary osteoarthritis of both knees 01/13/2020   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   Weight gain 02/14/2020    Past Surgical History:  Procedure Laterality Date   CESAREAN SECTION     X2   DILATION AND CURETTAGE OF UTERUS     KNEE SURGERY     left    Current Medications: Current Meds  Medication Sig   apixaban  (ELIQUIS ) 5 MG TABS tablet Take 1 tablet (5 mg total) by mouth 2 (two) times daily.    ibuprofen  (ADVIL ) 800 MG tablet Take 1 tablet (800 mg total) by mouth every 8 (eight) hours as needed for mild pain (pain score 1-3) or moderate pain (pain score 4-6).   methimazole (TAPAZOLE) 10 MG tablet Take 10 mg by mouth daily.   metoprolol  tartrate (LOPRESSOR ) 25 MG tablet Take 0.5 tablets (12.5 mg total) by mouth 2 (two) times daily.   [DISCONTINUED] metoprolol  tartrate (LOPRESSOR ) 25 MG tablet Take 1 tablet (25 mg total) by mouth 2 (two) times daily.     Allergies:   Keflex [cephalexin] and Naproxen   Social History   Socioeconomic History   Marital status: Married    Spouse name: Not on file   Number of children: Not on file   Years of education: Not on file   Highest education level: Associate degree: occupational, scientist, product/process development, or vocational program  Occupational History   Not on file  Tobacco Use   Smoking status: Never   Smokeless tobacco: Never  Vaping Use   Vaping status: Never Used  Substance and Sexual Activity   Alcohol use: No    Alcohol/week: 0.0 standard drinks of alcohol    Comment: rare   Drug use: No   Sexual activity: Yes    Birth control/protection: Post-menopausal    Comment: 1st intercourse 75 yo-1 partner  Other Topics Concern  Not on file  Social History Narrative   Not on file   Social Drivers of Health   Financial Resource Strain: Low Risk  (05/28/2024)   Overall Financial Resource Strain (CARDIA)    Difficulty of Paying Living Expenses: Not very hard  Food Insecurity: No Food Insecurity (05/28/2024)   Hunger Vital Sign    Worried About Running Out of Food in the Last Year: Never true    Ran Out of Food in the Last Year: Never true  Transportation Needs: No Transportation Needs (05/28/2024)   PRAPARE - Administrator, Civil Service (Medical): No    Lack of Transportation (Non-Medical): No  Physical Activity: Inactive (05/28/2024)   Exercise Vital Sign    Days of Exercise per Week: 0 days    Minutes of Exercise per Session: Not  on file  Stress: No Stress Concern Present (05/28/2024)   [employer] of Occupational Health - Occupational Stress Questionnaire    Feeling of Stress: Only a little  Social Connections: Moderately Integrated (05/28/2024)   Social Connection and Isolation Panel    Frequency of Communication with Friends and Family: Once a week    Frequency of Social Gatherings with Friends and Family: Once a week    Attends Religious Services: More than 4 times per year    Active Member of [employer] or Organizations: Yes    Attends Engineer, Structural: More than 4 times per year    Marital Status: Married     Family History: The patient's family history includes Breast cancer in her maternal aunt; Cancer in her paternal grandmother; Diabetes in her maternal grandmother; Heart disease in her father; Hypertension in her father; Thyroid disease in her mother. ROS:   Please see the history of present illness.    All 14 point review of systems negative except as described per history of present illness.  EKGs/Labs/Other Studies Reviewed:    The following studies were reviewed today:   EKG:       Recent Labs: 07/23/2024: ALT 22; BUN 13; Creatinine, Ser 0.56; Hemoglobin 11.3; Magnesium 1.8; Platelets 421; Potassium 3.8; Sodium 140; TSH <0.005  Recent Lipid Panel    Component Value Date/Time   CHOL 206 (H) 03/03/2023 0912   CHOL 189 01/13/2020 1618   TRIG 113 03/03/2023 0912   HDL 60 03/03/2023 0912   HDL 68 01/13/2020 1618   CHOLHDL 3.4 03/03/2023 0912   VLDL 14 01/26/2017 0935   LDLCALC 124 (H) 03/03/2023 0912    Physical Exam:    VS:  BP 132/80   Pulse 70   Ht 5' 1 (1.549 m)   Wt 176 lb 6.4 oz (80 kg)   LMP 11/17/2015   SpO2 98%   BMI 33.33 kg/m     Wt Readings from Last 3 Encounters:  09/11/24 176 lb 6.4 oz (80 kg)  08/05/24 174 lb (78.9 kg)  07/23/24 179 lb 3.2 oz (81.3 kg)     GENERAL:  Well nourished, well developed in no acute distress NECK: No JVD; No carotid  bruits CARDIAC: RRR, S1 and S2 present, no murmurs, no rubs, no gallops CHEST:  Clear to auscultation without rales, wheezing or rhonchi  Extremities: No pitting pedal edema. Pulses bilaterally symmetric with radial 2+ and dorsalis pedis 2+ NEUROLOGIC:  Alert and oriented x 3  Medication Adjustments/Labs and Tests Ordered: Current medicines are reviewed at length with the patient today.  Concerns regarding medicines are outlined above.  Orders Placed This Encounter  Procedures  LONG TERM MONITOR (3-14 DAYS)   Meds ordered this encounter  Medications   metoprolol  tartrate (LOPRESSOR ) 25 MG tablet    Sig: Take 0.5 tablets (12.5 mg total) by mouth 2 (two) times daily.    Dispense:  180 tablet    Refill:  3    Signed, Suzannah Bettes reddy Delonta Yohannes, MD, MPH, [facility]. 09/11/2024 11:47 AM    [facility]

## 2024-10-01 ENCOUNTER — Other Ambulatory Visit: Payer: Self-pay | Admitting: Nurse Practitioner

## 2024-10-04 ENCOUNTER — Ambulatory Visit: Payer: Self-pay

## 2024-10-04 DIAGNOSIS — R002 Palpitations: Secondary | ICD-10-CM

## 2024-10-04 DIAGNOSIS — I4729 Other ventricular tachycardia: Secondary | ICD-10-CM

## 2024-10-04 DIAGNOSIS — I480 Paroxysmal atrial fibrillation: Secondary | ICD-10-CM

## 2024-10-07 DIAGNOSIS — E0590 Thyrotoxicosis, unspecified without thyrotoxic crisis or storm: Secondary | ICD-10-CM | POA: Diagnosis not present

## 2024-10-09 ENCOUNTER — Encounter: Payer: Self-pay | Admitting: Orthopaedic Surgery

## 2024-10-09 ENCOUNTER — Ambulatory Visit: Admitting: Orthopaedic Surgery

## 2024-10-09 DIAGNOSIS — M1711 Unilateral primary osteoarthritis, right knee: Secondary | ICD-10-CM

## 2024-10-09 DIAGNOSIS — M25561 Pain in right knee: Secondary | ICD-10-CM

## 2024-10-09 DIAGNOSIS — M1712 Unilateral primary osteoarthritis, left knee: Secondary | ICD-10-CM | POA: Insufficient documentation

## 2024-10-09 DIAGNOSIS — G8929 Other chronic pain: Secondary | ICD-10-CM

## 2024-10-09 DIAGNOSIS — M25562 Pain in left knee: Secondary | ICD-10-CM

## 2024-10-09 HISTORY — DX: Unilateral primary osteoarthritis, right knee: M17.11

## 2024-10-09 HISTORY — DX: Unilateral primary osteoarthritis, left knee: M17.12

## 2024-10-09 NOTE — Progress Notes (Signed)
 The patient is a 63 year old female that I am seeing for the first time but I have taken care of of family members of her.  She comes in with severe debilitating bilateral knee pain with the right worse than the left.  She has seen other orthopedic colleagues who have recommended knee replacement surgery.  She has been to physical therapy as an outpatient as well as had steroid injections in her knees and they have only helped for couple days.  She cannot take anti-inflammatories because she is on Eliquis .  This is mainly due to the A-fib but also thyroid  disease.  She did see her cardiologist recently and they have set her up for a stress test in early January 2026.  She would like to proceed with knee replacement surgery sometime after that.  Both her knees hurt quite significantly and that is daily pain that is 10 out of 10.  It is detriment affecting her mobility, quality of life and her actives daily living.  I did review all of her medical history and medicines within epic.  On exam both knees have significant varus malalignment.  Both knees have a slight flexion contracture and limited flexion and extension.  X-rays on the canopy system of both knees shows severe end-stage arthritis with bone-on-bone wear of all 3 compartments of both knees with osteophytes in all 3 compartments as well as sclerotic changes and significant varus malalignment of both knees.  We had a long and thorough discussion about knee replacement surgery.  We can schedule this later in January after her stress test in early January if she is cleared from a cardiology standpoint.  She will need to stop Eliquis  for 3 full days prior to surgery.  I went over x-rays with her and her knee replacement model.  We discussed the risks and benefits of surgery in detail as well as the discussion of what to expect from an intraoperative and postoperative standpoint.  She is the primary care giver for her husband who has significant medical  issues and she is working on getting family support and other support for when she does decide to have the surgery scheduled.  I will fill out a surgery schedule sheet today and give it to our surgery scheduler as well.  All questions concerns were answered and addressed.

## 2024-10-15 ENCOUNTER — Telehealth (HOSPITAL_BASED_OUTPATIENT_CLINIC_OR_DEPARTMENT_OTHER): Payer: Self-pay | Admitting: *Deleted

## 2024-10-15 NOTE — Telephone Encounter (Signed)
   Pre-operative Risk Assessment    Patient Name: Carol Franklin  DOB: 1960/11/16 MRN: [id number]   Date of last office visit: 09/11/24 DR. MADIREDDY Date of next office visit: 12/17/24 DR. MADIREDDY   Request for Surgical Clearance    Procedure:  RIGHT TOTAL KNEE ARTHROPLASTY  Date of Surgery:  Clearance TBD                                Surgeon:  DR. LONNI POLI Surgeon's Group or Practice Name:  [facility] Phone number:  [phone] Fax number:  [phone] ATTN: SHERRIE   Type of Clearance Requested:   - Medical  - Pharmacy:  Hold Apixaban  (Eliquis )     Type of Anesthesia:  SPINAL & BLOCK    Additional requests/questions:    Bonney Niels Jest   10/15/2024, 2:21 PM

## 2024-10-23 NOTE — Telephone Encounter (Signed)
 Helping in preop today. Per chart review, patient initially seen by MD 09/2024 and cleared for surgery then had monitor showing NSVT prompting recommendation for stress test 11/16/23. I assume this needs to be completed prior to clearing patient but want to be sure; prior OV note may need to be addended to reflect to hold off versus still OK to proceed. Dr. Liborio -  - Please route response to P CV DIV PREOP (the pre-op pool). Thank you.

## 2024-10-25 DIAGNOSIS — M546 Pain in thoracic spine: Secondary | ICD-10-CM | POA: Diagnosis not present

## 2024-10-25 DIAGNOSIS — M5451 Vertebrogenic low back pain: Secondary | ICD-10-CM | POA: Diagnosis not present

## 2024-10-25 DIAGNOSIS — M9903 Segmental and somatic dysfunction of lumbar region: Secondary | ICD-10-CM | POA: Diagnosis not present

## 2024-10-25 DIAGNOSIS — M9902 Segmental and somatic dysfunction of thoracic region: Secondary | ICD-10-CM | POA: Diagnosis not present

## 2024-10-29 DIAGNOSIS — M546 Pain in thoracic spine: Secondary | ICD-10-CM | POA: Diagnosis not present

## 2024-10-29 DIAGNOSIS — M9903 Segmental and somatic dysfunction of lumbar region: Secondary | ICD-10-CM | POA: Diagnosis not present

## 2024-10-29 DIAGNOSIS — M9902 Segmental and somatic dysfunction of thoracic region: Secondary | ICD-10-CM | POA: Diagnosis not present

## 2024-10-29 DIAGNOSIS — M5451 Vertebrogenic low back pain: Secondary | ICD-10-CM | POA: Diagnosis not present

## 2024-11-04 DIAGNOSIS — E0590 Thyrotoxicosis, unspecified without thyrotoxic crisis or storm: Secondary | ICD-10-CM | POA: Diagnosis not present

## 2024-11-06 ENCOUNTER — Other Ambulatory Visit: Payer: Self-pay

## 2024-11-06 DIAGNOSIS — R002 Palpitations: Secondary | ICD-10-CM

## 2024-11-13 ENCOUNTER — Telehealth (HOSPITAL_COMMUNITY): Payer: Self-pay | Admitting: *Deleted

## 2024-11-13 NOTE — Telephone Encounter (Signed)
 Spoke to patient as a reminder about her STRESS TEST on 11/15/24. Patient stated she needs to cancel the test until her knee is better. She said she can barely walk due to knee pain. She also stated that she will be contacting Dr. Liborio through [facility] CHART.

## 2024-11-15 ENCOUNTER — Inpatient Hospital Stay (HOSPITAL_COMMUNITY): Admission: RE | Admit: 2024-11-15 | Source: Ambulatory Visit

## 2024-11-18 NOTE — Telephone Encounter (Signed)
 Please contact patient to determine if she plans to complete the Lexiscan  myoview  in order to evaluate for clearance for knee surgery. Notes state that she cancelled the stress test because of pain with walking, but this test will not require her to walk and can help provide clearance for knee surgery.  Thank you, Rosaline

## 2024-11-18 NOTE — Telephone Encounter (Signed)
 Patient still plans to proceed with her surgery and was hesitant at first wondering why we needed to order a Lexiscan  on her. She asked if she will be put to sleep, who schedules this. She asked if it was completely necessary for her surgery since Dr. Liborio has cleared her. I reassured her that this test will have her laying down. She asked if I could know the availability of their department, I told her I had no control over when they schedule.

## 2024-11-19 ENCOUNTER — Encounter (HOSPITAL_COMMUNITY): Payer: Self-pay

## 2024-11-19 NOTE — Telephone Encounter (Signed)
 Forwarded to [place] to have test rescheduled.

## 2024-11-20 ENCOUNTER — Telehealth: Payer: Self-pay | Admitting: *Deleted

## 2024-11-20 NOTE — Telephone Encounter (Signed)
 Left a detailed message on voicemail as a reminder about a STRESS TEST on 11/22/24 at 12:30.

## 2024-11-20 NOTE — Telephone Encounter (Signed)
 Will update the requesting office the pt is scheduled for stress test 11/22/24 and then her f/u with Dr. Liborio 12/17/24 for her 3 month up as well.

## 2024-11-22 ENCOUNTER — Ambulatory Visit (HOSPITAL_COMMUNITY)
Admission: RE | Admit: 2024-11-22 | Discharge: 2024-11-22 | Disposition: A | Discharge status: Home / Self Care | Source: Ambulatory Visit

## 2024-11-22 DIAGNOSIS — Z01810 Encounter for preprocedural cardiovascular examination: Secondary | ICD-10-CM | POA: Diagnosis present

## 2024-11-22 DIAGNOSIS — R002 Palpitations: Secondary | ICD-10-CM | POA: Insufficient documentation

## 2024-11-22 LAB — MYOCARDIAL PERFUSION IMAGING
Base ST Depression (mm): 0 mm
LV dias vol: 87 mL (ref 46–106)
LV sys vol: 25 mL
Nuc Stress EF: 71 %
Peak HR: 125 {beats}/min
Rest HR: 68 {beats}/min
Rest Nuclear Isotope Dose: 10.2 mCi
SDS: 0
SRS: 0
SSS: 0
ST Depression (mm): 0 mm
Stress Nuclear Isotope Dose: 31.4 mCi
TID: 1.12

## 2024-11-22 MED ORDER — REGADENOSON 0.4 MG/5ML IV SOLN
INTRAVENOUS | Status: AC
  Filled 2024-11-22: qty 5

## 2024-11-22 MED ORDER — TECHNETIUM TC 99M TETROFOSMIN IV KIT
31.4000 | PACK | Freq: Once | INTRAVENOUS | Status: AC | PRN
  Administered 2024-11-22: 31.4 via INTRAVENOUS

## 2024-11-22 MED ORDER — REGADENOSON 0.4 MG/5ML IV SOLN
0.4000 mg | Freq: Once | INTRAVENOUS | Status: AC
  Administered 2024-11-22: 0.4 mg via INTRAVENOUS

## 2024-11-22 MED ORDER — TECHNETIUM TC 99M TETROFOSMIN IV KIT
10.2000 | PACK | Freq: Once | INTRAVENOUS | Status: AC | PRN
  Administered 2024-11-22: 10.2 via INTRAVENOUS

## 2024-11-26 ENCOUNTER — Telehealth: Payer: Self-pay

## 2024-11-26 ENCOUNTER — Ambulatory Visit: Payer: Self-pay

## 2024-11-26 NOTE — Telephone Encounter (Signed)
 Pt viewed Stress test results on My Chart per Dr. Madireddy's note. Routed to PCP.

## 2024-12-01 ENCOUNTER — Other Ambulatory Visit: Payer: Self-pay | Admitting: Nurse Practitioner

## 2024-12-12 ENCOUNTER — Ambulatory Visit: Payer: Self-pay

## 2024-12-13 ENCOUNTER — Telehealth: Payer: Self-pay

## 2024-12-13 NOTE — Telephone Encounter (Signed)
 Patient left voice mail wanting to scheduling TKA.  I called patient back and left voice mail for return call.

## 2024-12-17 ENCOUNTER — Ambulatory Visit
